# Patient Record
Sex: Female | Born: 1950 | Race: Black or African American | Hispanic: No | Smoking: Never smoker
Health system: Southern US, Community
[De-identification: ages and names within clinical notes are randomized; demographics above are authoritative.]

## PROBLEM LIST (undated history)

## (undated) DIAGNOSIS — E785 Hyperlipidemia, unspecified: Secondary | ICD-10-CM

## (undated) DIAGNOSIS — N200 Calculus of kidney: Secondary | ICD-10-CM

## (undated) DIAGNOSIS — J40 Bronchitis, not specified as acute or chronic: Secondary | ICD-10-CM

## (undated) DIAGNOSIS — E119 Type 2 diabetes mellitus without complications: Secondary | ICD-10-CM

## (undated) DIAGNOSIS — I10 Essential (primary) hypertension: Secondary | ICD-10-CM

## (undated) HISTORY — PX: TONSILLECTOMY: SUR1361

## (undated) HISTORY — DX: Type 2 diabetes mellitus without complications: E11.9

---

## 1999-04-01 ENCOUNTER — Other Ambulatory Visit: Admission: RE | Admit: 1999-04-01 | Discharge: 1999-04-01 | Payer: Self-pay | Admitting: Obstetrics and Gynecology

## 2001-11-05 ENCOUNTER — Emergency Department (HOSPITAL_COMMUNITY): Admission: EM | Admit: 2001-11-05 | Discharge: 2001-11-05 | Payer: Self-pay

## 2001-11-19 ENCOUNTER — Emergency Department (HOSPITAL_COMMUNITY): Admission: EM | Admit: 2001-11-19 | Discharge: 2001-11-19 | Payer: Self-pay | Admitting: *Deleted

## 2003-03-20 ENCOUNTER — Encounter: Payer: Self-pay | Admitting: Internal Medicine

## 2004-03-27 ENCOUNTER — Ambulatory Visit: Payer: Self-pay | Admitting: Internal Medicine

## 2004-04-01 ENCOUNTER — Ambulatory Visit: Payer: Self-pay | Admitting: Internal Medicine

## 2004-04-13 ENCOUNTER — Ambulatory Visit: Payer: Self-pay

## 2004-04-16 ENCOUNTER — Ambulatory Visit: Payer: Self-pay | Admitting: Internal Medicine

## 2004-05-18 ENCOUNTER — Encounter: Admission: RE | Admit: 2004-05-18 | Discharge: 2004-05-18 | Payer: Self-pay | Admitting: Cardiology

## 2004-10-28 ENCOUNTER — Ambulatory Visit: Payer: Self-pay | Admitting: Internal Medicine

## 2005-04-12 ENCOUNTER — Ambulatory Visit: Payer: Self-pay | Admitting: Internal Medicine

## 2005-05-24 ENCOUNTER — Ambulatory Visit: Payer: Self-pay | Admitting: Internal Medicine

## 2005-06-08 ENCOUNTER — Encounter: Admission: RE | Admit: 2005-06-08 | Discharge: 2005-09-06 | Payer: Self-pay | Admitting: Internal Medicine

## 2005-06-11 ENCOUNTER — Ambulatory Visit: Payer: Self-pay | Admitting: Internal Medicine

## 2005-11-10 ENCOUNTER — Ambulatory Visit: Payer: Self-pay | Admitting: Internal Medicine

## 2005-11-19 ENCOUNTER — Ambulatory Visit: Payer: Self-pay | Admitting: Internal Medicine

## 2006-07-04 DIAGNOSIS — I10 Essential (primary) hypertension: Secondary | ICD-10-CM | POA: Insufficient documentation

## 2006-07-04 DIAGNOSIS — Z8679 Personal history of other diseases of the circulatory system: Secondary | ICD-10-CM | POA: Insufficient documentation

## 2007-06-13 ENCOUNTER — Ambulatory Visit: Payer: Self-pay | Admitting: Internal Medicine

## 2007-06-13 DIAGNOSIS — E785 Hyperlipidemia, unspecified: Secondary | ICD-10-CM | POA: Insufficient documentation

## 2007-06-13 DIAGNOSIS — R9431 Abnormal electrocardiogram [ECG] [EKG]: Secondary | ICD-10-CM | POA: Insufficient documentation

## 2007-06-13 LAB — CONVERTED CEMR LAB
Bilirubin Urine: NEGATIVE
Blood in Urine, dipstick: NEGATIVE
Glucose, Urine, Semiquant: NEGATIVE
Ketones, urine, test strip: NEGATIVE
Nitrite: NEGATIVE
Protein, U semiquant: NEGATIVE
Specific Gravity, Urine: 1.025
Urobilinogen, UA: NEGATIVE
WBC Urine, dipstick: NEGATIVE
pH: 5

## 2007-06-15 LAB — CONVERTED CEMR LAB
ALT: 19 units/L (ref 0–35)
AST: 22 units/L (ref 0–37)
Albumin: 4 g/dL (ref 3.5–5.2)
Alkaline Phosphatase: 67 units/L (ref 39–117)
BUN: 8 mg/dL (ref 6–23)
Basophils Absolute: 0.1 10*3/uL (ref 0.0–0.1)
Basophils Relative: 1.3 % — ABNORMAL HIGH (ref 0.0–1.0)
Bilirubin, Direct: 0.1 mg/dL (ref 0.0–0.3)
CO2: 31 meq/L (ref 19–32)
Calcium: 9.3 mg/dL (ref 8.4–10.5)
Chloride: 106 meq/L (ref 96–112)
Cholesterol: 220 mg/dL (ref 0–200)
Creatinine, Ser: 1 mg/dL (ref 0.4–1.2)
Direct LDL: 171.8 mg/dL
Eosinophils Absolute: 0.1 10*3/uL (ref 0.0–0.6)
Eosinophils Relative: 1.2 % (ref 0.0–5.0)
GFR calc Af Amer: 74 mL/min
GFR calc non Af Amer: 61 mL/min
Glucose, Bld: 88 mg/dL (ref 70–99)
HCT: 43 % (ref 36.0–46.0)
HDL: 40.3 mg/dL (ref >39.0)
Hemoglobin: 13.6 g/dL (ref 12.0–15.0)
Hgb A1c MFr Bld: 6.4 % — ABNORMAL HIGH (ref 4.6–6.0)
Lymphocytes Relative: 45.8 % (ref 12.0–46.0)
MCHC: 31.5 g/dL (ref 30.0–36.0)
MCV: 83.6 fL (ref 78.0–100.0)
Monocytes Absolute: 0.3 10*3/uL (ref 0.2–0.7)
Monocytes Relative: 4.2 % (ref 3.0–11.0)
Neutro Abs: 2.9 10*3/uL (ref 1.4–7.7)
Neutrophils Relative %: 47.5 % (ref 43.0–77.0)
Platelets: 343 10*3/uL (ref 150–400)
Potassium: 3.7 meq/L (ref 3.5–5.1)
RBC: 5.14 M/uL — ABNORMAL HIGH (ref 3.87–5.11)
RDW: 13.7 % (ref 11.5–14.6)
Sodium: 142 meq/L (ref 135–145)
TSH: 0.94 microintl units/mL (ref 0.35–5.50)
Total Bilirubin: 0.6 mg/dL (ref 0.3–1.2)
Total CHOL/HDL Ratio: 5.5
Total Protein: 8.1 g/dL (ref 6.0–8.3)
Triglycerides: 84 mg/dL (ref 0–149)
VLDL: 17 mg/dL (ref 0–40)
WBC: 6.2 10*3/uL (ref 4.5–10.5)

## 2007-06-16 ENCOUNTER — Encounter (INDEPENDENT_AMBULATORY_CARE_PROVIDER_SITE_OTHER): Payer: Self-pay | Admitting: *Deleted

## 2007-06-19 ENCOUNTER — Telehealth (INDEPENDENT_AMBULATORY_CARE_PROVIDER_SITE_OTHER): Payer: Self-pay | Admitting: *Deleted

## 2007-09-05 ENCOUNTER — Ambulatory Visit: Payer: Self-pay | Admitting: Internal Medicine

## 2007-09-11 LAB — CONVERTED CEMR LAB
ALT: 17 units/L (ref 0–35)
AST: 18 units/L (ref 0–37)
Cholesterol: 172 mg/dL (ref 0–200)
HDL: 36 mg/dL — ABNORMAL LOW (ref >39.0)
Hgb A1c MFr Bld: 6.4 % — ABNORMAL HIGH (ref 4.6–6.0)
LDL Cholesterol: 124 mg/dL — ABNORMAL HIGH (ref 0–99)
Total CHOL/HDL Ratio: 4.8
Triglycerides: 58 mg/dL (ref 0–149)
VLDL: 12 mg/dL (ref 0–40)

## 2008-08-09 ENCOUNTER — Telehealth (INDEPENDENT_AMBULATORY_CARE_PROVIDER_SITE_OTHER): Payer: Self-pay | Admitting: *Deleted

## 2008-08-20 ENCOUNTER — Ambulatory Visit: Payer: Self-pay | Admitting: Internal Medicine

## 2008-08-20 DIAGNOSIS — E119 Type 2 diabetes mellitus without complications: Secondary | ICD-10-CM | POA: Insufficient documentation

## 2008-08-20 LAB — CONVERTED CEMR LAB
Cholesterol, target level: 200 mg/dL
HDL goal, serum: 40 mg/dL
LDL Goal: 130 mg/dL

## 2008-10-22 ENCOUNTER — Ambulatory Visit: Payer: Self-pay | Admitting: Internal Medicine

## 2008-10-29 ENCOUNTER — Encounter (INDEPENDENT_AMBULATORY_CARE_PROVIDER_SITE_OTHER): Payer: Self-pay | Admitting: *Deleted

## 2008-10-29 LAB — CONVERTED CEMR LAB
ALT: 22 units/L (ref 0–35)
AST: 21 units/L (ref 0–37)
Albumin: 4.2 g/dL (ref 3.5–5.2)
Alkaline Phosphatase: 74 units/L (ref 39–117)
BUN: 12 mg/dL (ref 6–23)
Bilirubin, Direct: 0 mg/dL (ref 0.0–0.3)
Cholesterol: 198 mg/dL (ref 0–200)
Creatinine, Ser: 0.9 mg/dL (ref 0.4–1.2)
HDL: 33.8 mg/dL — ABNORMAL LOW (ref >39.00)
Hgb A1c MFr Bld: 6.4 % (ref 4.6–6.5)
LDL Cholesterol: 146 mg/dL — ABNORMAL HIGH (ref 0–99)
Potassium: 4.2 meq/L (ref 3.5–5.1)
TSH: 1.43 microintl units/mL (ref 0.35–5.50)
Total Bilirubin: 0.8 mg/dL (ref 0.3–1.2)
Total CHOL/HDL Ratio: 6
Total Protein: 8.1 g/dL (ref 6.0–8.3)
Triglycerides: 89 mg/dL (ref 0.0–149.0)
VLDL: 17.8 mg/dL (ref 0.0–40.0)

## 2010-01-05 ENCOUNTER — Telehealth: Payer: Self-pay | Admitting: Internal Medicine

## 2010-01-07 ENCOUNTER — Ambulatory Visit: Payer: Self-pay | Admitting: Internal Medicine

## 2010-01-07 ENCOUNTER — Encounter: Payer: Self-pay | Admitting: Internal Medicine

## 2010-01-15 ENCOUNTER — Ambulatory Visit: Payer: Self-pay | Admitting: Internal Medicine

## 2010-02-06 ENCOUNTER — Ambulatory Visit: Payer: Self-pay | Admitting: Internal Medicine

## 2010-04-28 NOTE — Assessment & Plan Note (Signed)
Summary: MEDS REFILL////SPH--HASNT BEEN SEEN SINCE MAY 2010///SPH   Vital Signs:  Patient profile:   60 year old female Height:      68.25 inches Weight:      216.4 pounds BMI:     32.78 Temp:     98.4 degrees F oral Pulse rate:   76 / minute Resp:     14 per minute BP sitting:   128 / 86  (left arm) Cuff size:   large  Vitals Entered By: Shonna Chock CMA (January 07, 2010 2:12 PM)    History of Present Illness:   Cynthia Forbes is here for a physical; she is asymptomatic.    HTN followup:The patient reports  some urinary frequency, but denies lightheadedness, headaches, and fatigue.  The patient denies the following associated symptoms: chest pain, chest pressure, exercise intolerance, dyspnea, palpitations, syncope, leg edema, and pedal edema.  Compliance with medications (by patient report) has been sporadic.Her insurance had lapsed & she was taking meds daily vs twice a day.  The patient reports that dietary compliance has been good.  The patient reports exercising occasionally.  Adjunctive measures currently used by the patient include salt restriction.  BP @ home 117-121/ 74-84.  Current Medications (verified): 1)  Metoprolol Tartrate 100 Mg Tabs (Metoprolol Tartrate) .Marland Kitchen.. 1 By Mouth Bid 2)  Spironolactone 25 Mg Tabs (Spironolactone) .Marland Kitchen.. 1 By Mouth Two Times A Day 3)  Albuterol 90 Mcg/act Aers (Albuterol) .Marland Kitchen.. 1-2 Puffs Q 4 Hours As Needed 4)  Asa 81mg  .... Once Daily 5)  Fish Oil 1000 Mg Caps (Omega-3 Fatty Acids) .Marland Kitchen.. 1 By Mouth Once Daily As Needed  Allergies: 1)  ! Codeine 2)  ! * Ivp Dye  Past History:  Past Medical History: MITRAL VALVE PROLAPSE, PMH  OF (ICD-V12.50) HYPERTENSION (ICD-401.9) Diabetes mellitus, type II Hyperlipidemia: NMR 2005: LDL 811(9147/829), HDL 29, TG 94.LDL goal = < 130.  Past Surgical History: gravida 1, para 1, adopted 2 Tonsillectomy renal calculi X3, Dr Marcello Fennel chest pain attributed to  mitral valve prolapse heart  cath 2006  negative,  Dr Elsie Lincoln Colonoscopy : NONE ( "no insurance")SOC reviewed   Family History: mother: hypertension,cva, dementia, disseminated  cancer, ? colon cancer with mets maternal aunts, uncles,cousin : metastatic cancer father :pancreatic cancer , hypertension, asthma,renal failure maternal grandfather: liver cancer  Social History: Never Smoked Alcohol use-no Occupation:Administrative Forensic psychologist  Review of Systems General:  Denies fatigue, fever, and weight loss; Hot flashes. Eyes:  Denies blurring, double vision, and vision loss-both eyes. ENT:  Denies difficulty swallowing and hoarseness. Resp:  Denies cough, shortness of breath, sputum productive, and wheezing; Seasonal RAD & post exercise. GI:  Denies abdominal pain, bloody stools, dark tarry stools, and indigestion. GU:  Denies discharge, dysuria, and hematuria. MS:  Denies joint pain, low back pain, mid back pain, and thoracic pain. Derm:  Denies changes in nail beds, dryness, hair loss, lesion(s), and poor wound healing. Neuro:  Denies numbness and tingling; No extremity burning. Psych:  Denies anxiety and depression. Endo:  Denies cold intolerance, excessive hunger, excessive thirst, excessive urination, and heat intolerance. Heme:  Denies abnormal bruising and bleeding. Allergy:  Complains of itching eyes, seasonal allergies, and sneezing.  Physical Exam  General:  well-nourished;alert,appropriate and cooperative throughout examination Head:  Normocephalic and atraumatic without obvious abnormalities.  Eyes:  No corneal or conjunctival inflammation noted.Perrla. Funduscopic exam benign, without hemorrhages, exudates or papilledema. Ears:  External ear exam shows no significant lesions or deformities.  Otoscopic examination reveals  clear canals, tympanic membranes are intact bilaterally without bulging, retraction, inflammation or discharge. Hearing is grossly normal bilaterally. Nose:  External nasal examination  shows no deformity or inflammation. Nasal mucosa are pink and moist without lesions or exudates. Mouth:  Oral mucosa and oropharynx without lesions or exudates.  Teeth in good repair. Neck:  No deformities, masses, or tenderness noted. Lungs:  Normal respiratory effort, chest expands symmetrically. Lungs are clear to auscultation, no crackles or wheezes. Heart:  Normal rate and regular rhythm. S1 and S2 normal without gallop, murmur, click, rub .S4 Abdomen:  Bowel sounds positive,abdomen soft and non-tender without masses, organomegaly or hernias noted. Genitalia:  Dr Arelia Sneddon Msk:  No deformity or scoliosis noted of thoracic or lumbar spine.   Pulses:  R and L carotid,radial,dorsalis pedis and posterior tibial pulses are full and equal bilaterally Extremities:  No clubbing, cyanosis, edema, or deformity noted with normal full range of motion of all joints.   Neurologic:  alert & oriented X3 and DTRs symmetrical and 1/2 +  Skin:  Intact without suspicious lesions or rashes Cervical Nodes:  No lymphadenopathy noted Axillary Nodes:  No palpable lymphadenopathy Psych:  memory intact for recent and remote, normally interactive, and good eye contact.     Impression & Recommendations:  Problem # 1:  ROUTINE GENERAL MEDICAL EXAM@HEALTH  CARE FACL (ICD-V70.0)  Orders: EKG w/ Interpretation (93000)  Problem # 2:  HYPERTENSION (ICD-401.9)  Her updated medication list for this problem includes:    Metoprolol Tartrate 100 Mg Tabs (Metoprolol tartrate) .Marland Kitchen... 1 by mouth bid    Spironolactone 25 Mg Tabs (Spironolactone) .Marland Kitchen... 1 by mouth two times a day  Problem # 3:  HYPERLIPIDEMIA (ICD-272.4)  Problem # 4:  NONSPECIFIC ABNORMAL ELECTROCARDIOGRAM (ICD-794.31) improved serially  Complete Medication List: 1)  Metoprolol Tartrate 100 Mg Tabs (Metoprolol tartrate) .Marland Kitchen.. 1 by mouth bid 2)  Spironolactone 25 Mg Tabs (Spironolactone) .Marland Kitchen.. 1 by mouth two times a day 3)  Albuterol 90 Mcg/act Aers  (Albuterol) .Marland Kitchen.. 1-2 puffs q 4 hours as needed 4)  Asa 81mg   .... Once daily 5)  Fish Oil 1000 Mg Caps (Omega-3 fatty acids) .Marland Kitchen.. 1 by mouth once daily as needed  Patient Instructions: 1)  Schedule fasting labs ; see Diagnoses for Codes: 2)  BMP prior to visit, ICD-9: 3)  Hepatic Panel prior to visit, ICD-9: 4)  Lipid Panel prior to visit, ICD-9: 5)  TSH prior to visit, ICD-9: 6)  CBC w/ Diff prior to visit, ICD-9: 7)  HbgA1C prior to visit, ICD-9: 8)  Urine Microalbumin prior to visit, ICD-9: 9)  Schedule a colonoscopy as discussed  to help detect colon cancer. 10)  Check your Blood Pressure regularly. If it is above: 135/85 ON AVERAGE  you should make an appointment. Prescriptions: ALBUTEROL 90 MCG/ACT AERS (ALBUTEROL) 1-2 puffs q 4 hours as needed  #1 x 2   Entered and Authorized by:   Marga Melnick MD   Signed by:   Marga Melnick MD on 01/07/2010   Method used:   Print then Give to Patient   RxID:   [id number] SPIRONOLACTONE 25 MG TABS (SPIRONOLACTONE) 1 by mouth two times a day  #180 x 1   Entered and Authorized by:   Marga Melnick MD   Signed by:   Marga Melnick MD on 01/07/2010   Method used:   Print then Give to Patient   RxID:   [id number] METOPROLOL TARTRATE 100 MG TABS (METOPROLOL TARTRATE) 1 by mouth bid  #180  x 1   Entered and Authorized by:   Marga Melnick MD   Signed by:   Marga Melnick MD on 01/07/2010   Method used:   Print then Give to Patient   RxID:   [id number]     Appended Document: MEDS REFILL////SPH--HASNT BEEN SEEN SINCE MAY 2010///SPH A1c 6.5%; urine microalbumin 63.3,LDL 133

## 2010-04-28 NOTE — Progress Notes (Signed)
Summary: METOPROLOL REFILL QUESTION  Phone Note Call from Patient Call back at [phone]   Caller: Patient Summary of Call: PATIENT HAS APPT TO SEE DR HOPPER ON WED 10/12 IN AFTERNOON---NEEDS TO REFILL METOPROLOL--SHOULD HAVE BEEN TAKING IT TWICE A DAY---HAS BEEN STRETCHING IT TO ONCE A DAY TO MAKE IT LAST---DOESNT WANT TO ASK FOR NEW PRESCRIPTION SINCE DR HOPPER MAY CHANGE IT---DO WE HAVE SAMPLES??  ONLY HAS THREE PILLS LEFT--ONE FOR EACH DAY UNTIL WED  OK TO LEAVE MESSAGE ON PHONE Initial call taken by: Jerolyn Shin,  January 05, 2010 10:03 AM  Follow-up for Phone Call        We do not have samples of this med, would you like for to offer to call in temporary prescription or offer something else. Please advise. Lucious Groves CMA  January 05, 2010 3:43 PM   Additional Follow-up for Phone Call Additional follow up Details #1::        Per MD patient needs to go to [facility] or other pharmacy that she does not normally use and pay for the rx out of pocket (no insurance). The prescription will be $4 for #60.   Patient notified and will go to [facility] [address] Additional Follow-up by: Lucious Groves CMA,  January 05, 2010 4:18 PM    Prescriptions: METOPROLOL TARTRATE 100 MG TABS (METOPROLOL TARTRATE) 1 by mouth bid  #60 x 0   Entered by:   Lucious Groves CMA   Authorized by:   Marga Melnick MD   Signed by:   Lucious Groves CMA on 01/05/2010   Method used:   Electronically to        [id number] (retail)       [address]       [place], [place]  [address]       Ph: [phone] or [phone]       Fax: [phone]   RxID:   [id number]

## 2010-04-28 NOTE — Assessment & Plan Note (Signed)
Summary: to discuss labs///sph   Vital Signs:  Patient profile:   60 year old female Weight:      217.2 pounds BMI:     32.90 Pulse rate:   76 / minute Resp:     17 per minute BP sitting:   134 / 90  (left arm) Cuff size:   large  Vitals Entered By: Shonna Chock CMA (February 06, 2010 3:10 PM) CC: Follow-up visit: discuss labs (patient with mailed copy), Type 2 diabetes mellitus follow-up   CC:  Follow-up visit: discuss labs (patient with mailed copy) and Type 2 diabetes mellitus follow-up.  History of Present Illness: Type 2 Diabetes Mellitus Follow-Up      This is a 60 year old woman who presents for Type 2 diabetes mellitus follow-up.  The patient denies polyuria, polydipsia, blurred vision, and numbness of extremities.  The patient denies the following symptoms: neuropathic pain, chest pain, vomiting, orthostatic symptoms, poor wound healing, intermittent claudication, vision loss, and foot ulcer.  Since the last visit the patient reports good dietary compliance and not monitoring blood glucose.  Since the last visit, the patient reports having had eye care by an  eye doctor. Vision actually improved. A1c 6.5% ( average 140 , risk 30%).    Current Medications (verified): 1)  Metoprolol Tartrate 100 Mg Tabs (Metoprolol Tartrate) .Marland Kitchen.. 1 By Mouth Bid 2)  Spironolactone 25 Mg Tabs (Spironolactone) .Marland Kitchen.. 1 By Mouth Two Times A Day 3)  Albuterol 90 Mcg/act Aers (Albuterol) .Marland Kitchen.. 1-2 Puffs Q 4 Hours As Needed 4)  Asa 81mg  .... Once Daily 5)  Fish Oil 1000 Mg Caps (Omega-3 Fatty Acids) .Marland Kitchen.. 1 By Mouth Once Daily As Needed  Allergies: 1)  ! Codeine 2)  ! * Ivp Dye  Physical Exam  General:  well-nourished;alert,appropriate and cooperative throughout examination Heart:  normal rate, regular rhythm, no gallop, no rub, no JVD, and grade 1 /6 systolic murmur.   Pulses:  R and L carotid,radial,dorsalis pedis and posterior tibial pulses are full and equal bilaterally Psych:  Oriented X3.    Intelligent & motivated   Impression & Recommendations:  Problem # 1:  DIABETES MELLITUS, TYPE II (ICD-250.00)  Her updated medication list for this problem includes:    Benazepril Hcl 10 Mg Tabs (Benazepril hcl) .Marland Kitchen... 1 once daily    Metformin Hcl 500 Mg Tabs (Metformin hcl) .Marland Kitchen... 1 once daily with largest meal  Problem # 2:  HYPERLIPIDEMIA (ICD-272.4)  Her updated medication list for this problem includes:    Pravastatin Sodium 20 Mg Tabs (Pravastatin sodium) .Marland Kitchen... 1 at bedtime  Problem # 3:  HYPERTENSION (ICD-401.9)  The following medications were removed from the medication list:    Spironolactone 25 Mg Tabs (Spironolactone) .Marland Kitchen... 1 by mouth two times a day Her updated medication list for this problem includes:    Metoprolol Tartrate 100 Mg Tabs (Metoprolol tartrate) .Marland Kitchen... 1 by mouth bid    Benazepril Hcl 10 Mg Tabs (Benazepril hcl) .Marland Kitchen... 1 once daily  Complete Medication List: 1)  Metoprolol Tartrate 100 Mg Tabs (Metoprolol tartrate) .Marland Kitchen.. 1 by mouth bid 2)  Albuterol 90 Mcg/act Aers (Albuterol) .Marland Kitchen.. 1-2 puffs q 4 hours as needed 3)  Asa 81mg   .... Once daily 4)  Fish Oil 1000 Mg Caps (Omega-3 fatty acids) .Marland Kitchen.. 1 by mouth once daily as needed 5)  Benazepril Hcl 10 Mg Tabs (Benazepril hcl) .Marland Kitchen.. 1 once daily 6)  Metformin Hcl 500 Mg Tabs (Metformin hcl) .Marland Kitchen.. 1 once daily with  largest meal 7)  Pravastatin Sodium 20 Mg Tabs (Pravastatin sodium) .Marland Kitchen.. 1 at bedtime  Patient Instructions: 1)  Please schedule a follow-up appointment in 3 months. 2)  Hepatic Panel prior to visit, ICD-9:995.20 3)  Lipid Panel prior to visit, ICD-9:272.4 4)  HbgA1C prior to visit, ICD-9:250.00 Prescriptions: PRAVASTATIN SODIUM 20 MG TABS (PRAVASTATIN SODIUM) 1 at bedtime  #90 x 1   Entered and Authorized by:   Marga Melnick MD   Signed by:   Marga Melnick MD on 02/06/2010   Method used:   Print then Give to Patient   RxID:   [id number] METFORMIN HCL 500 MG TABS (METFORMIN HCL) 1 once daily  with largest meal  #90 x 1   Entered and Authorized by:   Marga Melnick MD   Signed by:   Marga Melnick MD on 02/06/2010   Method used:   Print then Give to Patient   RxID:   [id number] BENAZEPRIL HCL 10 MG TABS (BENAZEPRIL HCL) 1 once daily  #90 x 1   Entered and Authorized by:   Marga Melnick MD   Signed by:   Marga Melnick MD on 02/06/2010   Method used:   Print then Give to Patient   RxID:   [id number]    Orders Added: 1)  Est. Patient Level III [01027]

## 2010-05-06 ENCOUNTER — Other Ambulatory Visit (INDEPENDENT_AMBULATORY_CARE_PROVIDER_SITE_OTHER): Payer: BC Managed Care – PPO

## 2010-05-06 ENCOUNTER — Other Ambulatory Visit: Payer: Self-pay | Admitting: Internal Medicine

## 2010-05-06 ENCOUNTER — Encounter (INDEPENDENT_AMBULATORY_CARE_PROVIDER_SITE_OTHER): Payer: Self-pay | Admitting: *Deleted

## 2010-05-06 DIAGNOSIS — E785 Hyperlipidemia, unspecified: Secondary | ICD-10-CM

## 2010-05-06 DIAGNOSIS — T887XXA Unspecified adverse effect of drug or medicament, initial encounter: Secondary | ICD-10-CM

## 2010-05-06 DIAGNOSIS — E119 Type 2 diabetes mellitus without complications: Secondary | ICD-10-CM

## 2010-05-06 LAB — LIPID PANEL
Cholesterol: 179 mg/dL (ref 0–200)
HDL: 43 mg/dL (ref >39.00)

## 2010-05-06 LAB — HEPATIC FUNCTION PANEL
ALT: 22 U/L (ref 0–35)
AST: 21 U/L (ref 0–37)
Bilirubin, Direct: 0.1 mg/dL (ref 0.0–0.3)
Total Bilirubin: 0.6 mg/dL (ref 0.3–1.2)

## 2010-05-06 LAB — HEMOGLOBIN A1C: Hgb A1c MFr Bld: 6.4 % (ref 4.6–6.5)

## 2010-05-13 ENCOUNTER — Encounter: Payer: Self-pay | Admitting: Internal Medicine

## 2010-05-13 ENCOUNTER — Ambulatory Visit (INDEPENDENT_AMBULATORY_CARE_PROVIDER_SITE_OTHER): Payer: BC Managed Care – PPO | Admitting: Internal Medicine

## 2010-05-13 DIAGNOSIS — E785 Hyperlipidemia, unspecified: Secondary | ICD-10-CM

## 2010-05-13 DIAGNOSIS — I10 Essential (primary) hypertension: Secondary | ICD-10-CM

## 2010-05-13 DIAGNOSIS — E119 Type 2 diabetes mellitus without complications: Secondary | ICD-10-CM

## 2010-05-20 NOTE — Assessment & Plan Note (Signed)
Summary: FOLLOW UP///SPH   Vital Signs:  Patient profile:   60 year old female Weight:      208.4 pounds BMI:     31.57 Pulse rate:   72 / minute Resp:     14 per minute BP sitting:   160 / 100  (left arm) Cuff size:   large  Vitals Entered By: Shonna Chock CMA (May 13, 2010 9:14 AM) CC: Follow-up visit: discuss labs (patient with mailed copy), Type 2 diabetes mellitus follow-up   CC:  Follow-up visit: discuss labs (patient with mailed copy) and Type 2 diabetes mellitus follow-up.  History of Present Illness:    Labs reviewed & risks discussed.     She  denies muscle aches, GI upset, abdominal pain, flushing, itching, constipation, diarrhea, and fatigue.  The patient denies the following symptoms: chest pain/pressure, exercise intolerance, dypsnea, palpitations, syncope, and pedal edema.  Compliance with medications (by patient report) has been near 100%.  Dietary compliance has been excellent.  The patient reports exercising occasionally.  Adjunctive measures currently used by the patient include fiber and ASA.  LDL 120 on Pravastatin 20 mg at bedtime.     See BP; she has not taken BP pills this. She  denies lightheadedness, urinary frequency, and headaches.  Compliance with medications (by patient report) has been near 100%.  Adjunctive measures currently used by the patient include salt restriction.  BP @ home 117-146/74-90.     She  reports weight loss, but denies polyuria, polydipsia, blurred vision, self managed hypoglycemia, and numbness of extremities.  The patient denies the following symptoms: neuropathic pain, vomiting, poor wound healing, intermittent claudication, vision loss, and foot ulcer.  Since the last visit the patient reports not monitoring blood glucose.  Since the last visit, the patient reports having had no foot care. A1c 6.4%    Current Medications (verified): 1)  Metoprolol Tartrate 100 Mg Tabs (Metoprolol Tartrate) .Marland Kitchen.. 1 By Mouth Bid 2)  Albuterol 90  Mcg/act Aers (Albuterol) .Marland Kitchen.. 1-2 Puffs Q 4 Hours As Needed 3)  Asa 81mg  .... Once Daily 4)  Fish Oil 1000 Mg Caps (Omega-3 Fatty Acids) .Marland Kitchen.. 1 By Mouth Once Daily As Needed 5)  Benazepril Hcl 10 Mg Tabs (Benazepril Hcl) .Marland Kitchen.. 1 Once Daily 6)  Metformin Hcl 500 Mg Tabs (Metformin Hcl) .Marland Kitchen.. 1 Once Daily With Largest Meal 7)  Pravastatin Sodium 20 Mg Tabs (Pravastatin Sodium) .Marland Kitchen.. 1 At Bedtime  Allergies: 1)  ! Codeine 2)  ! * Ivp Dye  Physical Exam  General:  well-nourished;alert,appropriate and cooperative throughout examination Neck:  No deformities, masses, or tenderness noted. Lungs:  Normal respiratory effort, chest expands symmetrically. Lungs are clear to auscultation, no crackles or wheezes. Heart:  Normal rate and regular rhythm. S1 and S2 normal without gallop, murmur, click, rub . S4 Pulses:  R and L carotid,radial,dorsalis pedis and posterior tibial pulses are full and equal bilaterally Extremities:  No clubbing, cyanosis, edema.  Neurologic:  alert & oriented X3 and sensation intact to light touch over feet.   Skin:  Intact without suspicious lesions or rashes Psych:  Focused & extremely motivated   Impression & Recommendations:  Problem # 1:  DIABETES MELLITUS, TYPE II (ICD-250.00) Good control Her updated medication list for this problem includes:    Metformin Hcl 500 Mg Tabs (Metformin hcl) .Marland Kitchen... 1 once daily with largest meal  Problem # 2:  HYPERTENSION (ICD-401.9) Controlled by history; goals discussed Her updated medication list for this problem includes:  Metoprolol Tartrate 100 Mg Tabs (Metoprolol tartrate) .Marland Kitchen... 1 by mouth bid  Problem # 3:  HYPERLIPIDEMIA (ICD-272.4) LDL not @ goal  Complete Medication List: 1)  Metoprolol Tartrate 100 Mg Tabs (Metoprolol tartrate) .Marland Kitchen.. 1 by mouth bid 2)  Albuterol 90 Mcg/act Aers (Albuterol) .Marland Kitchen.. 1-2 puffs q 4 hours as needed 3)  Asa 81mg   .... Once daily 4)  Fish Oil 1000 Mg Caps (Omega-3 fatty acids) .Marland Kitchen.. 1 by  mouth once daily as needed 5)  Benazepril Hcl 20 Mg Tabs (benazepril Hcl)  .Marland Kitchen.. 1 once daily 6)  Metformin Hcl 500 Mg Tabs (Metformin hcl) .Marland Kitchen.. 1 once daily with largest meal 7)  Pravastatin Sodium 40 Mg Tabs (pravastatin Sodium)  .Marland Kitchen.. 1 at bedtime  Patient Instructions: 1)  Please schedule a follow-up appointment in 6 months. 2)  BUN, creat, K+ prior to visit, ICD-9:401.9 3)  Hepatic Panel prior to visit, ICD-9:995.20 4)  Lipid Panel prior to visit, ICD-9:272.4 5)  HbgA1C prior to visit, ICD-9:250.00 6)  Urine Microalbumin prior to visit, ICD-9:250.00 7)  See your eye doctor yearly to check for diabetic eye damage. 8)  Check your feet each night for sore areas, calluses or signs of infection. 9)  Check your Blood Pressure regularly. If it is above:135/85 ON AVERAGE you should make an appointment. Prescriptions: PRAVASTATIN SODIUM 40 MG TABS (PRAVASTATIN SODIUM) 1 at bedtime  #90 x 1   Entered and Authorized by:   Marga Melnick MD   Signed by:   Marga Melnick MD on 05/13/2010   Method used:   Print then Give to Patient   RxID:   [id number] BENAZEPRIL HCL 20  MG TABS (BENAZEPRIL HCL) 1 once daily  #90 x 1   Entered and Authorized by:   Marga Melnick MD   Signed by:   Marga Melnick MD on 05/13/2010   Method used:   Print then Give to Patient   RxID:   [id number]    Orders Added: 1)  Est. Patient Level III [84696]

## 2011-02-12 ENCOUNTER — Other Ambulatory Visit: Payer: Self-pay | Admitting: Internal Medicine

## 2011-07-07 ENCOUNTER — Encounter: Payer: Self-pay | Admitting: Internal Medicine

## 2011-07-07 ENCOUNTER — Ambulatory Visit (INDEPENDENT_AMBULATORY_CARE_PROVIDER_SITE_OTHER): Payer: BC Managed Care – PPO | Admitting: Internal Medicine

## 2011-07-07 VITALS — BP 142/84 | HR 72 | Temp 98.5°F | Wt 218.0 lb

## 2011-07-07 DIAGNOSIS — M546 Pain in thoracic spine: Secondary | ICD-10-CM

## 2011-07-07 MED ORDER — CYCLOBENZAPRINE HCL 5 MG PO TABS
ORAL_TABLET | ORAL | Status: DC
Start: 2011-07-07 — End: 2011-08-22

## 2011-07-07 MED ORDER — CELECOXIB 200 MG PO CAPS: 200.0000 mg | ORAL_CAPSULE | Freq: Two times a day (BID) | ORAL | Status: AC

## 2011-07-07 NOTE — Progress Notes (Signed)
  Subjective:    Patient ID: Cynthia Forbes, female    DOB: 10/12/1950, 61 y.o.   MRN: [id number]  HPI She has had almost constant left upper posterior thorax "gnawing" pain since 07/02/11. She attributes this to lifting her grandson repeatedly. He weighs approximately 55 pounds. She also questions whether this is related to moving tables at work.  If she places her hand on this area & presses down  she gets relief as she does with rotating the thorax laterally. Low-dose nonsteroidals & heat were of  minimal benefit.  She has a past history of mitral valve prolapse with palpitations and pain. She states the discomfort was somewhat similar to that    Review of Systems  She denies any significant associated anterior chest pain, palpitations, dyspnea, cough, sputum production, or hemoptysis. She's had no swelling or pain in her calves.  There's been no rash in the area of the discomfort. She's noted some associated numbness possibly in the left upper extremity but no tingling or weakness.     Objective:   Physical Exam  She is healthy and well-nourished.  There is no lymphadenopathy about the neck or axilla.  No rash is noted over the left upper posterior thorax.  There is some localized tenderness to deep palpation in this area  Deep tendon reflexes, strength and tone are all normal.  With neck flexion she has some discomfort in the left trapezius area. No neuromuscular deficit can be documented in the upper extremities        Assessment & Plan:  #1 possible C5 radicular pain. No neuromuscular deficit present  Plan: Physical therapy if no better with massage , cervical pillow,Celebrex   and muscle relaxants .

## 2011-07-07 NOTE — Patient Instructions (Addendum)
Use a cervical memory foam pillow to prevent hyperextension or hyperflexion of the cervical spine. Massage therapy is medically indicated

## 2011-07-08 ENCOUNTER — Other Ambulatory Visit: Payer: Self-pay | Admitting: Internal Medicine

## 2011-07-12 ENCOUNTER — Other Ambulatory Visit: Payer: Self-pay | Admitting: Internal Medicine

## 2011-07-13 NOTE — Telephone Encounter (Signed)
Patient needs to schedule a CPX with fasting labs  

## 2011-08-22 ENCOUNTER — Other Ambulatory Visit: Payer: Self-pay

## 2011-08-22 ENCOUNTER — Emergency Department (HOSPITAL_COMMUNITY): Payer: BC Managed Care – PPO

## 2011-08-22 ENCOUNTER — Emergency Department (HOSPITAL_COMMUNITY)
Admission: EM | Admit: 2011-08-22 | Discharge: 2011-08-22 | Disposition: A | Discharge status: Home / Self Care | Payer: BC Managed Care – PPO | Attending: Emergency Medicine | Admitting: Emergency Medicine

## 2011-08-22 ENCOUNTER — Encounter (HOSPITAL_COMMUNITY): Payer: Self-pay | Admitting: Emergency Medicine

## 2011-08-22 DIAGNOSIS — N132 Hydronephrosis with renal and ureteral calculous obstruction: Secondary | ICD-10-CM

## 2011-08-22 DIAGNOSIS — R112 Nausea with vomiting, unspecified: Secondary | ICD-10-CM | POA: Insufficient documentation

## 2011-08-22 DIAGNOSIS — R1031 Right lower quadrant pain: Secondary | ICD-10-CM | POA: Insufficient documentation

## 2011-08-22 DIAGNOSIS — I10 Essential (primary) hypertension: Secondary | ICD-10-CM | POA: Insufficient documentation

## 2011-08-22 DIAGNOSIS — N201 Calculus of ureter: Secondary | ICD-10-CM | POA: Insufficient documentation

## 2011-08-22 DIAGNOSIS — R197 Diarrhea, unspecified: Secondary | ICD-10-CM | POA: Insufficient documentation

## 2011-08-22 HISTORY — DX: Bronchitis, not specified as acute or chronic: J40

## 2011-08-22 HISTORY — DX: Essential (primary) hypertension: I10

## 2011-08-22 LAB — COMPREHENSIVE METABOLIC PANEL
ALT: 27 U/L (ref 0–35)
AST: 23 U/L (ref 0–37)
Albumin: 4.2 g/dL (ref 3.5–5.2)
Alkaline Phosphatase: 84 U/L (ref 39–117)
BUN: 11 mg/dL (ref 6–23)
CO2: 28 mEq/L (ref 19–32)
Calcium: 9.2 mg/dL (ref 8.4–10.5)
Chloride: 104 mEq/L (ref 96–112)
Creatinine, Ser: 1.1 mg/dL (ref 0.50–1.10)
GFR calc Af Amer: 63 mL/min — ABNORMAL LOW (ref >90)
GFR calc non Af Amer: 54 mL/min — ABNORMAL LOW (ref >90)
Glucose, Bld: 149 mg/dL — ABNORMAL HIGH (ref 70–99)
Potassium: 3.5 mEq/L (ref 3.5–5.1)
Sodium: 139 mEq/L (ref 135–145)
Total Bilirubin: 0.2 mg/dL — ABNORMAL LOW (ref 0.3–1.2)
Total Protein: 8.3 g/dL (ref 6.0–8.3)

## 2011-08-22 LAB — URINE MICROSCOPIC-ADD ON

## 2011-08-22 LAB — URINALYSIS, ROUTINE W REFLEX MICROSCOPIC
Glucose, UA: 100 mg/dL — AB
Leukocytes, UA: NEGATIVE
Specific Gravity, Urine: 1.023 (ref 1.005–1.030)
pH: 6.5 (ref 5.0–8.0)

## 2011-08-22 LAB — CBC
Hemoglobin: 12.9 g/dL (ref 12.0–15.0)
MCH: 26 pg (ref 26.0–34.0)
Platelets: 333 10*3/uL (ref 150–400)
RBC: 4.96 MIL/uL (ref 3.87–5.11)
WBC: 9 10*3/uL (ref 4.0–10.5)

## 2011-08-22 LAB — DIFFERENTIAL
Eosinophils Absolute: 0 10*3/uL (ref 0.0–0.7)
Lymphocytes Relative: 16 % (ref 12–46)
Lymphs Abs: 1.5 10*3/uL (ref 0.7–4.0)
Monocytes Relative: 2 % — ABNORMAL LOW (ref 3–12)
Neutrophils Relative %: 81 % — ABNORMAL HIGH (ref 43–77)

## 2011-08-22 LAB — LIPASE, BLOOD: Lipase: 35 U/L (ref 11–59)

## 2011-08-22 MED ORDER — OXYCODONE-ACETAMINOPHEN 5-325 MG PO TABS: 1.0000 | ORAL_TABLET | Freq: Four times a day (QID) | ORAL | Status: AC | PRN

## 2011-08-22 MED ORDER — MORPHINE SULFATE 4 MG/ML IJ SOLN
6.0000 mg | Freq: Once | INTRAMUSCULAR | Status: AC
  Administered 2011-08-22: 6 mg via INTRAVENOUS
  Filled 2011-08-22: qty 2

## 2011-08-22 MED ORDER — IOHEXOL 300 MG/ML  SOLN
100.0000 mL | Freq: Once | INTRAMUSCULAR | Status: AC | PRN
  Administered 2011-08-22: 100 mL via INTRAVENOUS

## 2011-08-22 MED ORDER — TAMSULOSIN HCL 0.4 MG PO CAPS: 0.4000 mg | ORAL_CAPSULE | Freq: Every day | ORAL | Status: DC

## 2011-08-22 MED ORDER — PROMETHAZINE HCL 25 MG PO TABS: 25.0000 mg | ORAL_TABLET | Freq: Four times a day (QID) | ORAL | Status: DC | PRN

## 2011-08-22 MED ORDER — HYDROMORPHONE HCL PF 1 MG/ML IJ SOLN
1.0000 mg | Freq: Once | INTRAMUSCULAR | Status: AC
  Administered 2011-08-22: 1 mg via INTRAVENOUS
  Filled 2011-08-22: qty 1

## 2011-08-22 MED ORDER — ONDANSETRON HCL 4 MG/2ML IJ SOLN
4.0000 mg | Freq: Once | INTRAMUSCULAR | Status: AC
  Administered 2011-08-22: 4 mg via INTRAVENOUS
  Filled 2011-08-22: qty 2

## 2011-08-22 MED ORDER — KETOROLAC TROMETHAMINE 30 MG/ML IJ SOLN
30.0000 mg | Freq: Once | INTRAMUSCULAR | Status: AC
  Administered 2011-08-22: 30 mg via INTRAVENOUS
  Filled 2011-08-22: qty 1

## 2011-08-22 MED ORDER — SODIUM CHLORIDE 0.9 % IV BOLUS (SEPSIS)
1000.0000 mL | Freq: Once | INTRAVENOUS | Status: AC
  Administered 2011-08-22: 1000 mL via INTRAVENOUS

## 2011-08-22 NOTE — ED Notes (Signed)
Pt given diet ginger ale. Appears to tolerate it.

## 2011-08-22 NOTE — ED Notes (Signed)
Patient transported to CT 

## 2011-08-22 NOTE — ED Notes (Signed)
Patient assisted to restroom. Minimal assist needed.

## 2011-08-22 NOTE — ED Notes (Signed)
Pt c/o right sided abdominal pain that radiates to back and down side. Pt also c/o NVD that began Saturday night and continued into this morning. Pt states now she is dry-heaving. Pt is anxious and states she has "the shakes".

## 2011-08-22 NOTE — ED Notes (Addendum)
Chris PA notified regarding BP of 200/98, also informed that pt is feeling dizzy. NO BP med instructed, sts although BP is high but pt is asymptomatic.

## 2011-08-22 NOTE — ED Provider Notes (Signed)
History     CSN: [id number]  Arrival date & time 08/22/11  0554   First MD Initiated Contact with Patient 08/22/11 [phone]      Chief Complaint  Patient presents with  . Abdominal Pain    (Consider location/radiation/quality/duration/timing/severity/associated sxs/prior treatment) HPI The patient presents to the emergency department with right lower quadrant abdominal pain that began last night.  Patient, states she has also had some nausea, vomiting, and diarrhea, associated with this.  Patient states that she had some sweating last night and through the early morning today.  She denies chest pain, or shortness of breath, fever, back pain, or weakness.  Patient states she did try some ibuprofen, which gave her minimal relief.  Patient states that nothing really seemed to make the pain worse.       Past Medical History  Diagnosis Date  . Hypertension   . Bronchitis     Past Surgical History  Procedure Date  . Tonsillectomy     History reviewed. No pertinent family history.  History  Substance Use Topics  . Smoking status: Never Smoker   . Smokeless tobacco: Not on file  . Alcohol Use: No    OB History    Grav Para Term Preterm Abortions TAB SAB Ect Mult Living                  Review of Systems All other systems negative except as documented in the HPI. All pertinent positives and negatives as reviewed in the HPI.  Allergies  Codeine  Home Medications   Current Outpatient Rx  Name Route Sig Dispense Refill  . ASPIRIN 81 MG PO TABS Oral Take 81 mg by mouth daily.    Marland Kitchen BENAZEPRIL HCL 20 MG PO TABS  TAKE ONE TABLET BY MOUTH EVERY DAY 90 tablet 1  . CYCLOBENZAPRINE HCL 5 MG PO TABS  1-2 qhs 14 tablet 0  . METOPROLOL TARTRATE 100 MG PO TABS  TAKE ONE TABLET BY MOUTH TWICE DAILY 180 tablet 1  . PRAVASTATIN SODIUM 40 MG PO TABS  TAKE ONE TABLET BY MOUTH EVERY DAY AT BEDTIME 90 tablet 0    **LABS/APPOINTMENT OVER-DUE**    BP 179/94  Resp 22  SpO2  100%  Physical Exam  Nursing note and vitals reviewed. Constitutional: She is oriented to person, place, and time. She appears well-developed and well-nourished. No distress.  HENT:  Head: Normocephalic and atraumatic.  Eyes: Pupils are equal, round, and reactive to light.  Neck: Normal range of motion. Neck supple.  Cardiovascular: Normal rate, regular rhythm and normal heart sounds.  Exam reveals no gallop and no friction rub.   No murmur heard. Pulmonary/Chest: Effort normal and breath sounds normal.  Abdominal: Soft. Bowel sounds are normal. She exhibits no distension and no mass. There is no rigidity, no rebound, no guarding, no tenderness at McBurney's point and negative Murphy's sign. No hernia.    Neurological: She is alert and oriented to person, place, and time.  Skin: Skin is warm and dry. No rash noted.    ED Course  Procedures (including critical care time)  Labs Reviewed  DIFFERENTIAL - Abnormal; Notable for the following:    Neutrophils Relative 81 (*)    Monocytes Relative 2 (*)    All other components within normal limits  CBC  URINALYSIS, ROUTINE W REFLEX MICROSCOPIC  COMPREHENSIVE METABOLIC PANEL  LIPASE, BLOOD   on reexamination of the patient states her abdominal pain, is now down to 3/10.  The  patient is advised, that she does have an incidental finding of a mass on her left ovary and that I would speak with the on-call GYN.  I spoke with Dr. Su Hilt from GYN.  She says if the patient is not having significant pain or other symptoms associated with this finding that we could have her followup in her office.  Patient is advised, that she will need followup for this finding and voices an understanding of this.  Patient will be given further pain control and anti-emetics at this time.  The patient will most likely be discharged home with followup with urology as well.  I rechecked the patient x2, since she has been in the emergency department.  9:43 AM.  The  patient and she is having a reaction to the Dilaudid as far as it is making her feel swimmy headed, room spinning, and continued nausea.  His pain is better other than the feeling she is having from the Dilaudid.  She states that she is sensitive to pain meds.  I advised her we will continue to watch and see if we can help her feel better.  12:27 PM the patient is feeling improved and we will do an oral fluid trial.  Patient is advised that she will most likely due home following this trial. MDM   MDM Reviewed: vitals, nursing note and previous chart Interpretation: labs and CT scan Consults: OB/GYN           Carlyle Dolly, PA-C 08/22/11 1243

## 2011-08-22 NOTE — ED Provider Notes (Signed)
Medical screening examination/treatment/procedure(s) were performed by non-physician practitioner and as supervising physician I was immediately available for consultation/collaboration.  F/u arranged with GYN for possible left ovarian mass.  Flint Melter, MD 08/22/11 [phone]

## 2011-08-22 NOTE — ED Notes (Signed)
Report received from Night RN. First contact with patient. Pt finished contrast, waiting for CT. Reports pain is better compare to first came in. Request for pain med, will ask EDP. BP is 157/105

## 2011-08-22 NOTE — ED Notes (Signed)
Pt's [place]: [phone]

## 2011-08-22 NOTE — Discharge Instructions (Signed)
You will need to followup the urologist provided by calling their office in the morning.  You also need to followup with the GYN doctor.  This provided for further evaluation of this mass that is on your left ovary.Return here for any worsening in your condition.

## 2011-08-25 NOTE — ED Provider Notes (Signed)
Medical screening examination/treatment/procedure(s) were performed by non-physician practitioner and as supervising physician I was immediately available for consultation/collaboration.  Flint Melter, MD 08/25/11 [phone]

## 2011-08-26 ENCOUNTER — Encounter: Payer: Self-pay | Admitting: Obstetrics and Gynecology

## 2011-10-05 ENCOUNTER — Other Ambulatory Visit: Payer: Self-pay | Admitting: Obstetrics & Gynecology

## 2011-10-05 HISTORY — PX: ABDOMINAL HYSTERECTOMY: SHX81

## 2011-10-05 HISTORY — PX: OOPHORECTOMY: SHX86

## 2011-10-19 ENCOUNTER — Encounter (HOSPITAL_COMMUNITY): Payer: Self-pay | Admitting: Anesthesiology

## 2011-10-19 ENCOUNTER — Inpatient Hospital Stay (HOSPITAL_COMMUNITY): Payer: BC Managed Care – PPO | Admitting: Anesthesiology

## 2011-10-19 ENCOUNTER — Ambulatory Visit (HOSPITAL_COMMUNITY)
Admission: AD | Admit: 2011-10-19 | Discharge: 2011-10-21 | Disposition: A | Discharge status: Home / Self Care | Payer: BC Managed Care – PPO | Source: Ambulatory Visit | Attending: Obstetrics and Gynecology | Admitting: Obstetrics and Gynecology

## 2011-10-19 ENCOUNTER — Encounter (HOSPITAL_COMMUNITY): Payer: Self-pay | Admitting: *Deleted

## 2011-10-19 ENCOUNTER — Inpatient Hospital Stay (HOSPITAL_COMMUNITY): Payer: BC Managed Care – PPO

## 2011-10-19 ENCOUNTER — Encounter (HOSPITAL_COMMUNITY)
Admission: AD | Disposition: A | Discharge status: Home / Self Care | Payer: Self-pay | Source: Ambulatory Visit | Attending: Obstetrics and Gynecology

## 2011-10-19 DIAGNOSIS — E785 Hyperlipidemia, unspecified: Secondary | ICD-10-CM

## 2011-10-19 DIAGNOSIS — E119 Type 2 diabetes mellitus without complications: Secondary | ICD-10-CM

## 2011-10-19 DIAGNOSIS — Z8679 Personal history of other diseases of the circulatory system: Secondary | ICD-10-CM

## 2011-10-19 DIAGNOSIS — T8130XA Disruption of wound, unspecified, initial encounter: Secondary | ICD-10-CM | POA: Insufficient documentation

## 2011-10-19 DIAGNOSIS — R9431 Abnormal electrocardiogram [ECG] [EKG]: Secondary | ICD-10-CM

## 2011-10-19 DIAGNOSIS — I10 Essential (primary) hypertension: Secondary | ICD-10-CM

## 2011-10-19 HISTORY — PX: OTHER SURGICAL HISTORY: SHX169

## 2011-10-19 HISTORY — DX: Calculus of kidney: N20.0

## 2011-10-19 HISTORY — DX: Hyperlipidemia, unspecified: E78.5

## 2011-10-19 LAB — TYPE AND SCREEN: Antibody Screen: NEGATIVE

## 2011-10-19 LAB — CBC WITH DIFFERENTIAL/PLATELET
Eosinophils Relative: 1 % (ref 0–5)
Hemoglobin: 11.6 g/dL — ABNORMAL LOW (ref 12.0–15.0)
Lymphocytes Relative: 28 % (ref 12–46)
Lymphs Abs: 3 10*3/uL (ref 0.7–4.0)
MCH: 26 pg (ref 26.0–34.0)
MCV: 84.8 fL (ref 78.0–100.0)
Monocytes Relative: 5 % (ref 3–12)
Neutrophils Relative %: 66 % (ref 43–77)
Platelets: 445 10*3/uL — ABNORMAL HIGH (ref 150–400)
RBC: 4.47 MIL/uL (ref 3.87–5.11)
WBC: 10.9 10*3/uL — ABNORMAL HIGH (ref 4.0–10.5)

## 2011-10-19 LAB — URINE MICROSCOPIC-ADD ON

## 2011-10-19 LAB — URINALYSIS, ROUTINE W REFLEX MICROSCOPIC
Bilirubin Urine: NEGATIVE
Ketones, ur: NEGATIVE mg/dL
Nitrite: NEGATIVE
Specific Gravity, Urine: >1.03 — ABNORMAL HIGH (ref 1.005–1.030)
Urobilinogen, UA: 0.2 mg/dL (ref 0.0–1.0)

## 2011-10-19 SURGERY — REPAIR, VAGINAL CUFF
Anesthesia: Monitor Anesthesia Care | Site: Vagina | Wound class: Clean Contaminated

## 2011-10-19 MED ORDER — ZOLPIDEM TARTRATE 5 MG PO TABS: 5.0000 mg | ORAL_TABLET | Freq: Every evening | ORAL | Status: DC | PRN

## 2011-10-19 MED ORDER — BUTORPHANOL TARTRATE 1 MG/ML IJ SOLN: 1.0000 mg | INTRAMUSCULAR | Status: DC | PRN

## 2011-10-19 MED ORDER — DIPHENHYDRAMINE HCL 12.5 MG/5ML PO ELIX: 12.5000 mg | ORAL_SOLUTION | Freq: Four times a day (QID) | ORAL | Status: DC | PRN

## 2011-10-19 MED ORDER — LIDOCAINE HCL (CARDIAC) 20 MG/ML IV SOLN
INTRAVENOUS | Status: DC | PRN
  Administered 2011-10-19: 80 mg via INTRAVENOUS

## 2011-10-19 MED ORDER — BENAZEPRIL HCL 10 MG PO TABS
20.0000 mg | ORAL_TABLET | Freq: Every day | ORAL | Status: DC
  Administered 2011-10-20 – 2011-10-21 (×2): 20 mg via ORAL
  Filled 2011-10-19 (×3): qty 2

## 2011-10-19 MED ORDER — KETOROLAC TROMETHAMINE 30 MG/ML IJ SOLN
30.0000 mg | Freq: Four times a day (QID) | INTRAMUSCULAR | Status: DC
  Administered 2011-10-20: 30 mg via INTRAVENOUS
  Filled 2011-10-19: qty 1

## 2011-10-19 MED ORDER — MIDAZOLAM HCL 5 MG/5ML IJ SOLN
INTRAMUSCULAR | Status: DC | PRN
  Administered 2011-10-19: 2 mg via INTRAVENOUS

## 2011-10-19 MED ORDER — CEFAZOLIN SODIUM-DEXTROSE 2-3 GM-% IV SOLR
2.0000 g | Freq: Once | INTRAVENOUS | Status: AC
  Administered 2011-10-19: 2 g via INTRAVENOUS

## 2011-10-19 MED ORDER — KETOROLAC TROMETHAMINE 30 MG/ML IJ SOLN
INTRAMUSCULAR | Status: AC
  Administered 2011-10-19: 30 mg via INTRAVENOUS
  Filled 2011-10-19: qty 1

## 2011-10-19 MED ORDER — ONDANSETRON HCL 4 MG PO TABS
4.0000 mg | ORAL_TABLET | Freq: Four times a day (QID) | ORAL | Status: DC | PRN
  Administered 2011-10-20: 4 mg via ORAL
  Filled 2011-10-19: qty 1

## 2011-10-19 MED ORDER — KETOROLAC TROMETHAMINE 30 MG/ML IJ SOLN
30.0000 mg | Freq: Once | INTRAMUSCULAR | Status: AC
  Administered 2011-10-19: 30 mg via INTRAVENOUS

## 2011-10-19 MED ORDER — PROPOFOL 10 MG/ML IV EMUL
INTRAVENOUS | Status: DC | PRN
  Administered 2011-10-19: 150 mg via INTRAVENOUS

## 2011-10-19 MED ORDER — FENTANYL CITRATE 0.05 MG/ML IJ SOLN
25.0000 ug | INTRAMUSCULAR | Status: DC | PRN
  Administered 2011-10-19: 25 ug via INTRAVENOUS

## 2011-10-19 MED ORDER — ONDANSETRON HCL 4 MG/2ML IJ SOLN: 4.0000 mg | Freq: Four times a day (QID) | INTRAMUSCULAR | Status: DC | PRN

## 2011-10-19 MED ORDER — SODIUM CHLORIDE 0.9 % IJ SOLN: 9.0000 mL | INTRAMUSCULAR | Status: DC | PRN

## 2011-10-19 MED ORDER — DEXTROSE IN LACTATED RINGERS 5 % IV SOLN
INTRAVENOUS | Status: DC
  Administered 2011-10-20: via INTRAVENOUS

## 2011-10-19 MED ORDER — LACTATED RINGERS IV SOLN
INTRAVENOUS | Status: DC
  Administered 2011-10-19 (×2): via INTRAVENOUS

## 2011-10-19 MED ORDER — MEPERIDINE HCL 25 MG/ML IJ SOLN: 6.2500 mg | INTRAMUSCULAR | Status: DC | PRN

## 2011-10-19 MED ORDER — DIPHENHYDRAMINE HCL 50 MG/ML IJ SOLN: 12.5000 mg | Freq: Four times a day (QID) | INTRAMUSCULAR | Status: DC | PRN

## 2011-10-19 MED ORDER — KETOROLAC TROMETHAMINE 30 MG/ML IJ SOLN: 30.0000 mg | Freq: Four times a day (QID) | INTRAMUSCULAR | Status: DC

## 2011-10-19 MED ORDER — MENTHOL 3 MG MT LOZG: 1.0000 | LOZENGE | OROMUCOSAL | Status: DC | PRN

## 2011-10-19 MED ORDER — 0.9 % SODIUM CHLORIDE (POUR BTL) OPTIME
TOPICAL | Status: DC | PRN
  Administered 2011-10-19: 1000 mL

## 2011-10-19 MED ORDER — IBUPROFEN 800 MG PO TABS
800.0000 mg | ORAL_TABLET | Freq: Three times a day (TID) | ORAL | Status: DC | PRN
  Administered 2011-10-20 (×2): 800 mg via ORAL
  Filled 2011-10-19 (×2): qty 1

## 2011-10-19 MED ORDER — CITRIC ACID-SODIUM CITRATE 334-500 MG/5ML PO SOLN
ORAL | Status: AC
  Administered 2011-10-19: 21:00:00
  Filled 2011-10-19: qty 15

## 2011-10-19 MED ORDER — NALOXONE HCL 0.4 MG/ML IJ SOLN: 0.4000 mg | INTRAMUSCULAR | Status: DC | PRN

## 2011-10-19 MED ORDER — DEXAMETHASONE SODIUM PHOSPHATE 10 MG/ML IJ SOLN
INTRAMUSCULAR | Status: DC | PRN
  Administered 2011-10-19: 10 mg via INTRAVENOUS

## 2011-10-19 MED ORDER — CITRIC ACID-SODIUM CITRATE 334-500 MG/5ML PO SOLN: 30.0000 mL | Freq: Once | ORAL | Status: DC

## 2011-10-19 MED ORDER — METOCLOPRAMIDE HCL 5 MG/ML IJ SOLN: 10.0000 mg | Freq: Once | INTRAMUSCULAR | Status: DC | PRN

## 2011-10-19 MED ORDER — MORPHINE SULFATE (PF) 1 MG/ML IV SOLN
INTRAVENOUS | Status: DC
  Administered 2011-10-20: 1 mg via INTRAVENOUS
  Administered 2011-10-20: via INTRAVENOUS
  Filled 2011-10-19: qty 25

## 2011-10-19 MED ORDER — FENTANYL CITRATE 0.05 MG/ML IJ SOLN
INTRAMUSCULAR | Status: DC | PRN
  Administered 2011-10-19: 100 ug via INTRAVENOUS

## 2011-10-19 MED ORDER — ONDANSETRON HCL 4 MG/2ML IJ SOLN
INTRAMUSCULAR | Status: DC | PRN
  Administered 2011-10-19: 4 mg via INTRAVENOUS

## 2011-10-19 MED ORDER — CEFAZOLIN SODIUM-DEXTROSE 2-3 GM-% IV SOLR
2.0000 g | INTRAVENOUS | Status: DC
  Filled 2011-10-19: qty 50

## 2011-10-19 MED ORDER — OXYCODONE-ACETAMINOPHEN 5-325 MG PO TABS
1.0000 | ORAL_TABLET | ORAL | Status: DC | PRN
  Administered 2011-10-20: 1 via ORAL
  Filled 2011-10-19: qty 1

## 2011-10-19 MED ORDER — FENTANYL CITRATE 0.05 MG/ML IJ SOLN
INTRAMUSCULAR | Status: AC
  Administered 2011-10-19: 25 ug via INTRAVENOUS
  Filled 2011-10-19: qty 2

## 2011-10-19 SURGICAL SUPPLY — 18 items
CATH ROBINSON RED A/P 16FR (CATHETERS) ×1 IMPLANT
GAUZE PACKING 1 X5 YD ST (GAUZE/BANDAGES/DRESSINGS) ×1 IMPLANT
GLOVE BIO SURGEON STRL SZ7 (GLOVE) ×2 IMPLANT
GLOVE SKINSENSE NS SZ7.5 (GLOVE) ×1
GLOVE SKINSENSE NS SZ8.0 LF (GLOVE) ×1
GLOVE SKINSENSE STRL SZ7.5 (GLOVE) IMPLANT
GLOVE SKINSENSE STRL SZ8.0 LF (GLOVE) IMPLANT
GOWN SURGICAL LARGE (GOWNS) ×2 IMPLANT
PACK VAGINAL MINOR WOMEN LF (CUSTOM PROCEDURE TRAY) ×1 IMPLANT
PAD PREP 24X48 CUFFED NSTRL (MISCELLANEOUS) ×1 IMPLANT
SLEEVE SCD COMPRESS KNEE MED (MISCELLANEOUS) ×1 IMPLANT
SUT VIC AB 2-0 CT1 (SUTURE) ×2 IMPLANT
SUT VIC AB 2-0 CT1 27 (SUTURE) ×2
SUT VIC AB 2-0 CT1 TAPERPNT 27 (SUTURE) IMPLANT
TOWEL OR 17X24 6PK STRL BLUE (TOWEL DISPOSABLE) ×2 IMPLANT
TRAY FOLEY CATH 14FR (SET/KITS/TRAYS/PACK) ×1 IMPLANT
TUBING NON-CON 1/4 X 20 CONN (TUBING) ×1 IMPLANT
YANKAUER SUCT BULB TIP NO VENT (SUCTIONS) ×1 IMPLANT

## 2011-10-19 NOTE — Transfer of Care (Signed)
Immediate Anesthesia Transfer of Care Note  Patient: Cynthia Forbes  Procedure(s) Performed: Procedure(s) (LRB): REPAIR VAGINAL CUFF (N/A)  Patient Location: PACU  Anesthesia Type: General  Level of Consciousness: awake  Airway & Oxygen Therapy: Patient Spontanous Breathing and Patient connected to nasal cannula oxygen  Post-op Assessment: Report given to PACU RN and Post -op Vital signs reviewed and stable  Post vital signs: stable  Complications: No apparent anesthesia complications

## 2011-10-19 NOTE — Anesthesia Postprocedure Evaluation (Signed)
  Anesthesia Post-op Note  Patient: Cynthia Forbes  Procedure(s) Performed: Procedure(s) (LRB): REPAIR VAGINAL CUFF (N/A)  Patient Location: PACU  Anesthesia Type: General  Level of Consciousness: awake, alert  and oriented  Airway and Oxygen Therapy: Patient Spontanous Breathing  Post-op Pain: none  Post-op Assessment: Post-op Vital signs reviewed, Patient's Cardiovascular Status Stable, Respiratory Function Stable, Patent Airway, No signs of Nausea or vomiting and Pain level controlled  Post-op Vital Signs: Reviewed and stable  Complications: No apparent anesthesia complications

## 2011-10-19 NOTE — Op Note (Signed)
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  EBL: 400 cc, mostly old blood clot.  Specimens removed: None  Procedure and findings:  The patient taken the operating room after an adequate level of general anesthesia was obtained with the patient's legs in stirrups the perineum and vagina were prepped and draped in usual fashion for vaginal procedures. Appropriate timeout for taken. Bladder drained with a red rubber catheter. He UA carried out no masses were palpable weighted speculum was positioned anterior retractor was then used for exposure suction was then used to define the vaginal cuff which is partially separated in the middle there was no evidence of any bowel or omental herniation in this area most of this was generalized venous bleeding, dark blood I did not see any arterial bleeding. 2-0 Vicryl interrupted sutures and then used to reapproximate the vaginal cuff right to left lung is entirely. This was observed and noted be hemostatic vaginal pack was position Foley catheter positioned draining clear urine she will be observed overnight went to PACU in stable condition  Dictated with [facility]  Cynthia Forbes

## 2011-10-19 NOTE — MAU Note (Signed)
Hysterectomy on July 9. Started bleeding a little heavier yesterday. Started gushing today around 5pm and massive large clots.

## 2011-10-19 NOTE — Anesthesia Preprocedure Evaluation (Addendum)
Anesthesia Evaluation  Patient identified by MRN, date of birth, ID band Patient awake    Reviewed: Allergy & Precautions, H&P , NPO status , Patient's Chart, lab work & pertinent test results, reviewed documented beta blocker date and time   Airway Mallampati: III TM Distance: >3 FB Neck ROM: Full    Dental No notable dental hx. (+) Teeth Intact   Pulmonary neg pulmonary ROS,  breath sounds clear to auscultation  Pulmonary exam normal       Cardiovascular hypertension, Pt. on medications and Pt. on home beta blockers Rhythm:Regular Rate:Normal     Neuro/Psych Anxiety negative neurological ROS     GI/Hepatic negative GI ROS, Neg liver ROS,   Endo/Other  Well Controlled, Type 2  Renal/GU negative Renal ROS  negative genitourinary   Musculoskeletal negative musculoskeletal ROS (+)   Abdominal (+) + obese,   Peds  Hematology negative hematology ROS (+)   Anesthesia Other Findings   Reproductive/Obstetrics negative OB ROS                           Anesthesia Physical Anesthesia Plan  ASA: II and Emergent  Anesthesia Plan: General   Post-op Pain Management:    Induction: Intravenous  Airway Management Planned: LMA  Additional Equipment:   Intra-op Plan:   Post-operative Plan: Extubation in OR  Informed Consent: I have reviewed the patients History and Physical, chart, labs and discussed the procedure including the risks, benefits and alternatives for the proposed anesthesia with the patient or authorized representative who has indicated his/her understanding and acceptance.   Dental advisory given  Plan Discussed with: CRNA, Anesthesiologist and Surgeon  Anesthesia Plan Comments:        Anesthesia Quick Evaluation

## 2011-10-19 NOTE — H&P (Signed)
Cynthia Forbes is an 61 y.o. female. S/P LAVH by Dr Jennette Kettle at the surgical center 12 days ago, presents to MAU with increased bleeding and clots  Pertinent Gynecological History: Menses: post op Bleeding: post op bleeding Contraception: none DES exposure: denies Blood transfusions: none Sexually transmitted diseases: no past history Previous GYN Procedures: LAVH  Last mammogram: normal Date: unk Last pap: unk Date: unk OB History:   Menstrual History: Menarche age: unk No LMP recorded. Patient is postmenopausal.    Past Medical History  Diagnosis Date  . Hypertension   . Bronchitis   . Kidney stones   . Hyperlipemia     Past Surgical History  Procedure Date  . Tonsillectomy   . Abdominal hysterectomy   . Oophorectomy     History reviewed. No pertinent family history.  Social History:  reports that she has never smoked. She does not have any smokeless tobacco history on file. She reports that she does not drink alcohol or use illicit drugs.  Allergies:  Allergies  Allergen Reactions  . Codeine     Rash , tachycardia & dyspnea  . Ivp Dye (Iodinated Diagnostic Agents) Hives and Shortness Of Breath  . Strawberry Hives    Prescriptions prior to admission  Medication Sig Dispense Refill  . aspirin 81 MG tablet Take 81 mg by mouth daily.      . benazepril (LOTENSIN) 20 MG tablet TAKE ONE TABLET BY MOUTH EVERY DAY  90 tablet  1  . cyclobenzaprine (FLEXERIL) 5 MG tablet Take 5-10 mg by mouth 3 (three) times daily as needed. For pain      . metoprolol (LOPRESSOR) 100 MG tablet TAKE ONE TABLET BY MOUTH TWICE DAILY  180 tablet  1  . oxyCODONE-acetaminophen (PERCOCET/ROXICET) 5-325 MG per tablet Take 1 tablet by mouth every 4 (four) hours as needed. For pain      . pravastatin (PRAVACHOL) 40 MG tablet TAKE ONE TABLET BY MOUTH EVERY DAY AT BEDTIME  90 tablet  0  . promethazine (PHENERGAN) 25 MG tablet Take 1 tablet (25 mg total) by mouth every 6 (six) hours as needed for  nausea.  10 tablet  0    Review of Systems  Constitutional: Negative.   HENT: Negative.   Eyes: Negative.   Respiratory: Negative.   Cardiovascular: Negative.   Genitourinary:       Vag bleeding  Musculoskeletal: Negative.   Skin: Negative.   Neurological: Negative.   Endo/Heme/Allergies: Negative.     Blood pressure 183/109, pulse 95, temperature 100.6 F (38.1 C), temperature source Oral, resp. rate 20. Physical Exam  Constitutional: She is oriented to person, place, and time. She appears well-developed and well-nourished.  HENT:  Head: Normocephalic and atraumatic.  Eyes: Pupils are equal, round, and reactive to light.  Neck: Normal range of motion. Neck supple.  Cardiovascular: Normal rate and regular rhythm.   Respiratory: Effort normal and breath sounds normal.  GI: Soft.       umbilical inc is C/D  Genitourinary:       BRB and clot from cuff, difficult to tell in MAU, but appears to be cuff bleeder  Musculoskeletal: Normal range of motion.  Neurological: She is alert and oriented to person, place, and time.  Skin: Skin is warm and dry.    Results for orders placed during the hospital encounter of 10/19/11 (from the past 24 hour(s))  CBC WITH DIFFERENTIAL     Status: Abnormal   Collection Time   10/19/11  7:13  PM      Component Value Range   WBC 10.9 (*) 4.0 - 10.5 K/uL   RBC 4.47  3.87 - 5.11 MIL/uL   Hemoglobin 11.6 (*) 12.0 - 15.0 g/dL   HCT 96.0  45.4 - 09.8 %   MCV 84.8  78.0 - 100.0 fL   MCH 26.0  26.0 - 34.0 pg   MCHC 30.6  30.0 - 36.0 g/dL   RDW 11.9  14.7 - 82.9 %   Platelets 445 (*) 150 - 400 K/uL   Neutrophils Relative 66  43 - 77 %   Neutro Abs 7.2  1.7 - 7.7 K/uL   Lymphocytes Relative 28  12 - 46 %   Lymphs Abs 3.0  0.7 - 4.0 K/uL   Monocytes Relative 5  3 - 12 %   Monocytes Absolute 0.5  0.1 - 1.0 K/uL   Eosinophils Relative 1  0 - 5 %   Eosinophils Absolute 0.1  0.0 - 0.7 K/uL   Basophils Relative 0  0 - 1 %   Basophils Absolute 0.0  0.0  - 0.1 K/uL    No results found.  Assessment/Plan: Post op LAVH with cuff bleeder, discussed need for EUA and vag approach to suturing, poss need for DL, blood transfusion...other risks include infection, adjacent organ injury or need for other surgical procedures  Akili Cuda M 10/19/2011, 9:06 PM

## 2011-10-19 NOTE — MAU Note (Signed)
Patient states she had a LAVH 2 weeks ago done by Dr. Jennette Kettle. Started bleeding a little after 1700 today and states she has had nine episodes of heavy bleeding with passing large clots. In the bathroom patient had heavy bleeding with several large clots in the toilet. Patient denies any pain.

## 2011-10-19 NOTE — MAU Provider Note (Signed)
History     CSN: [id number]  Arrival date and time: 10/19/11 4782   First Provider Initiated Contact with Patient 10/19/11 1910      Chief Complaint  Patient presents with  . Vaginal Bleeding   HPI Cynthia Forbes is 61 y.o. patient of Dr. Marcelina Morel, presents with sudden onset of heavy vaginal bleeding today at 5.  Had TAH, BSO on 7/9 at surgical center.  She passed kidney stone mid June.  States she has had spotting daily since precedure, a little heavier today until 5pm very heavy sudden bleeding with large clots.  Came in MAU with soaked pad.  Having cramps.  Has percocet at home but hasn't used it.   Denies vaginal penetration.    Past Medical History  Diagnosis Date  . Hypertension   . Bronchitis     Past Surgical History  Procedure Date  . Tonsillectomy     No family history on file.  History  Substance Use Topics  . Smoking status: Never Smoker   . Smokeless tobacco: Not on file  . Alcohol Use: No    Allergies:  Allergies  Allergen Reactions  . Codeine     Rash , tachycardia & dyspnea    Prescriptions prior to admission  Medication Sig Dispense Refill  . aspirin 81 MG tablet Take 81 mg by mouth daily.      . benazepril (LOTENSIN) 20 MG tablet TAKE ONE TABLET BY MOUTH EVERY DAY  90 tablet  1  . metoprolol (LOPRESSOR) 100 MG tablet TAKE ONE TABLET BY MOUTH TWICE DAILY  180 tablet  1  . pravastatin (PRAVACHOL) 40 MG tablet TAKE ONE TABLET BY MOUTH EVERY DAY AT BEDTIME  90 tablet  0  . promethazine (PHENERGAN) 25 MG tablet Take 1 tablet (25 mg total) by mouth every 6 (six) hours as needed for nausea.  10 tablet  0  . Tamsulosin HCl (FLOMAX) 0.4 MG CAPS Take 1 capsule (0.4 mg total) by mouth daily.  4 capsule  0    Review of Systems  Constitutional: Negative for fever and chills.  Respiratory: Negative.   Cardiovascular: Negative.   Gastrointestinal: Positive for abdominal pain (cramps). Negative for nausea and vomiting.  Genitourinary:       + vaginal  bleeding   Physical Exam   Blood pressure 183/109, pulse 95, resp. rate 20.  Physical Exam  Constitutional: She is oriented to person, place, and time. She appears well-developed and well-nourished. No distress.  HENT:  Head: Normocephalic.  Neck: Normal range of motion.  Cardiovascular: Normal rate.   Respiratory: Effort normal.  GI: Soft. She exhibits no distension and no mass. There is no tenderness. There is no rebound and no guarding.  Genitourinary:       Large amount of bright red blood in the vaginal with 3 large clots.  Difficult to see cervical cuff because of the heavy bleeding  Neurological: She is alert and oriented to person, place, and time.  Skin: Skin is warm.   Results for orders placed during the hospital encounter of 10/19/11 (from the past 24 hour(s))  CBC WITH DIFFERENTIAL     Status: Abnormal   Collection Time   10/19/11  7:13 PM      Component Value Range   WBC 10.9 (*) 4.0 - 10.5 K/uL   RBC 4.47  3.87 - 5.11 MIL/uL   Hemoglobin 11.6 (*) 12.0 - 15.0 g/dL   HCT 95.6  21.3 - 08.6 %   MCV 84.8  78.0 - 100.0 fL   MCH 26.0  26.0 - 34.0 pg   MCHC 30.6  30.0 - 36.0 g/dL   RDW 16.1  09.6 - 04.5 %   Platelets 445 (*) 150 - 400 K/uL   Neutrophils Relative 66  43 - 77 %   Neutro Abs 7.2  1.7 - 7.7 K/uL   Lymphocytes Relative 28  12 - 46 %   Lymphs Abs 3.0  0.7 - 4.0 K/uL   Monocytes Relative 5  3 - 12 %   Monocytes Absolute 0.5  0.1 - 1.0 K/uL   Eosinophils Relative 1  0 - 5 %   Eosinophils Absolute 0.1  0.0 - 0.7 K/uL   Basophils Relative 0  0 - 1 %   Basophils Absolute 0.0  0.0 - 0.1 K/uL   MAU Course  Procedures  MDM 18:30  Reported MSE and labs to Dr. Marcelle Overlie.  Order given for IV LR at 200cc/hr, pelvic ultrasound, and in and out cath  20:00 Care turned over to V. Katrinka Blazing, CNM  Assessment and Plan  A:  Post operative bleeding  KEY,EVE M 10/19/2011, 7:10 PM   Care of pt assumed at 2000. Passed 5x10 cm clot per RN. RNs unable to start IV.  Anesthesia coming to try. T&S ordered.    To OR per Dr. Marcelle Overlie.   [place], [place] 10/19/2011 9:33 PM

## 2011-10-20 LAB — CBC
Hemoglobin: 9.7 g/dL — ABNORMAL LOW (ref 12.0–15.0)
RBC: 3.73 MIL/uL — ABNORMAL LOW (ref 3.87–5.11)
WBC: 17.3 10*3/uL — ABNORMAL HIGH (ref 4.0–10.5)

## 2011-10-20 MED ORDER — POLYETHYLENE GLYCOL 3350 17 G PO PACK
17.0000 g | PACK | Freq: Two times a day (BID) | ORAL | Status: DC
  Administered 2011-10-20 – 2011-10-21 (×3): 17 g via ORAL
  Filled 2011-10-20 (×5): qty 1

## 2011-10-20 NOTE — Progress Notes (Signed)
Pt tolerating regular diet.  Pain controlled.  No further VB  Gen - Pt very anxious, NAD Abd - soft, NT, ND PV - packing removed, no active bleeding after removal Ext - SCD in place  Labs reviewed, Hgb 9.7  A/P:  Pt lives alone, uncomfortable w/ d/c home today Plan for transition to PO meds, d/c foley and obs for any further vb today Advance diet as tolerated Encourage ambulation

## 2011-10-20 NOTE — Addendum Note (Signed)
Addendum  created 10/20/11 1436 by Gertie Fey, CRNA   Modules edited:Notes Section

## 2011-10-20 NOTE — Anesthesia Postprocedure Evaluation (Signed)
  Anesthesia Post-op Note  Patient: Cynthia Forbes  Procedure(s) Performed: Procedure(s) (LRB): REPAIR VAGINAL CUFF (N/A)  Patient Location: PACU and Women's Unit  Anesthesia Type: General  Level of Consciousness: awake, alert  and oriented  Airway and Oxygen Therapy: Patient Spontanous Breathing  Post-op Pain: none  Post-op Assessment: Post-op Vital signs reviewed  Post-op Vital Signs: Reviewed and stable  Complications: No apparent anesthesia complications

## 2011-10-21 NOTE — Progress Notes (Signed)
2 Days Post-Op Procedure(s) (LRB): REPAIR VAGINAL CUFF (N/A)  Subjective: Patient reports tolerating PO, + flatus and no problems voiding.    Objective: I have reviewed patient's vital signs, intake and output and medications.  General: alert, cooperative and appears stated age Vaginal Bleeding: none abdomen is soft and non tender  Assessment: s/p Procedure(s) (LRB): REPAIR VAGINAL CUFF (N/A): stable and progressing well  Plan: Advance diet Discharge home  LOS: 2 days    Takeisha Cianci L 10/21/2011, 8:35 AM

## 2011-10-21 NOTE — Progress Notes (Signed)
Ms Sharrar was in good spirits when I visited with her, but she was also exhausted by having had 2 major surgeries within 2 weeks.  The second surgery gave her quite a scare and she is naturally worried now about what else might happen.  She has good friends here, but the rest of her family, besides her children, are not local and many are deceased.    I offered emotional and spiritual support.  Chaplain Dyanne Carrel [phone] 10:40 AM   10/21/11 1000  Clinical Encounter Type  Visited With Patient and family together  Visit Type Spiritual support  Spiritual Encounters  Spiritual Needs Emotional  Stress Factors  Patient Stress Factors (2 major surgeries within 2 weeks)

## 2011-10-21 NOTE — Discharge Summary (Signed)
Admission Diagnosis: Dehiscence of vaginal cuff  Discharge diagnosis: Same  Hospital Course: 61 year old female status post LAVH 2 weeks ago presented with acute dehiscence of vaginal cuff. She has done well post op. She is discharged home at POD #2.  She will follow up with Dr. Jennette Kettle in 2 weeks.

## 2012-01-06 ENCOUNTER — Telehealth: Payer: Self-pay | Admitting: Internal Medicine

## 2012-01-06 NOTE — Telephone Encounter (Signed)
Caller: Reka/Patient; Patient Name: Cynthia Forbes; PCP: Marga Melnick; Best Callback Phone Number: [phone], she has had neck stiffness and pain down left neck wiith movement  since 12/25/11, tried massage and pain meds from prior surgery, also Motrin 800mg   and not effective,she is asking to be seen, denies emergent symptoms per Neck Pain Guideline,see in 72 hrs disposition due to home care not effective,call after 5pm,no open appointments noted for PCP for 01/07/12, she will call in am for appointment,Home Care Advise given,Motrin 800mg  PO q 8 hrs as needed for pain,moist heat per care advise,callback perimeters gone over

## 2012-01-06 NOTE — Telephone Encounter (Signed)
I spoke with patient and she stated that this started as a crook in her neck. Patient states this is going on x 2 weeks. Patient denies left side arm pain or chest pain. Patient aware if she develops chest pain, SOB, left side numbness/pain/tingling to seek medical attention quickly at ER, otherwise patient to be seen tomorrow at 1 pm

## 2012-01-07 ENCOUNTER — Encounter: Payer: Self-pay | Admitting: Internal Medicine

## 2012-01-07 ENCOUNTER — Ambulatory Visit (INDEPENDENT_AMBULATORY_CARE_PROVIDER_SITE_OTHER): Payer: BC Managed Care – PPO | Admitting: Internal Medicine

## 2012-01-07 VITALS — BP 140/90 | HR 69 | Temp 98.2°F | Resp 16 | Wt 215.0 lb

## 2012-01-07 DIAGNOSIS — I10 Essential (primary) hypertension: Secondary | ICD-10-CM

## 2012-01-07 DIAGNOSIS — M5412 Radiculopathy, cervical region: Secondary | ICD-10-CM

## 2012-01-07 MED ORDER — HYDROCODONE-ACETAMINOPHEN 5-500 MG PO TABS: ORAL_TABLET | ORAL | Status: DC

## 2012-01-07 MED ORDER — CYCLOBENZAPRINE HCL 5 MG PO TABS: ORAL_TABLET | ORAL | Status: DC

## 2012-01-07 NOTE — Progress Notes (Signed)
Subjective:    Patient ID: Cynthia Forbes, female    DOB: 1951/03/06, 61 y.o.   MRN: [id number]  HPI Symptoms began 13 days ago as left-sided neck pain upon awakening. Initially she described a "crick" with right neck rotation; but subsequently she developed a "crick" with left neck rotation.  The pain has progressed; it is described as sharp and intermittent. It is aggravated by right lateral neck rotation. It radiates to the left trapezius and upper/anterior shoulder area.  Massage therapy 01/05/12 actually may have aggravated her symptoms; certainly it did not relieve them.    Review of Systems She's had no similar episodes in the past. There was no trauma , overuse, hyperextension, or repetitive motion prior to onset of symptoms.  When the pain was at its greatest; she did have some tingling of the upper lateral shoulder area, but this has not recurred. There's been no associated weakness in the arm. She denies any bowel or bladder dysfunction.  There's been no associated rash, fever, or chills.  Significantly she did have extensive gynecologic surgery earlier this year. While being evaluated for renal calculi; an incidental finding on CT urogram was an ovarian mass. It proved to be a degenerative fibroid; but the nephrectomy and hysterectomy her complicated by subsequent severe vaginal hemorrhage. Emergency surgery evacuated the hematoma.  She does have heat intolerance and flashes       Objective:   Physical Exam Gen.:  well-nourished in appearance. Alert, appropriate and cooperative throughout exam. Head: Normocephalic without obvious abnormalities  Eyes: No corneal or conjunctival inflammation noted.  Neck: No deformities, masses, or tenderness noted. Range of motion decreased laterally. Lungs: Normal respiratory effort; chest expands symmetrically. Lungs are clear to auscultation without rales, wheezes, or increased work of breathing. Heart: Normal rate and rhythm. Normal S1 and  S2. No gallop, click, or rub. No murmur.                                                                                  Musculoskeletal/extremities: No deformity or scoliosis noted of  the thoracic or lumbar spine. No clubbing, cyanosis, edema, or deformity noted. Tone & strength  normal.Joints normal. Nail health  good. There is some localized tenderness to palpation over the trapezius area on the left Vascular: Carotid & radial artery pulses are full and equal. No bruits present. Neurologic: Alert and oriented x3. Deep tendon reflexes symmetrical but 1/2+.Sensation intact UE bilaterally. Gait including tiptoe and heel walking is normal.        Skin: Intact without suspicious lesions or rashes. Lymph: No cervical, axillary lymphadenopathy present. Psych: Mood and affect are normal. Normally interactive                                                                                         Assessment & Plan:  #  1 cervical area pain with C4-5 distribution. No neurologic deficit present on exam #2 HTN aggravated by #1  Plan: See orders and recommendations.

## 2012-01-07 NOTE — Patient Instructions (Addendum)
Use an anti-inflammatory cream such as  Zostrix cream twice a day to the area as needed. In lieu of this warm moist compresses or  hot water bottle can be used. Do not apply ice .  Blood Pressure Goal  Ideally is an AVERAGE < 135/85. This AVERAGE should be calculated from @ least 5-7 BP readings taken @ different times of day on different days of week. You should not respond to isolated BP readings , but rather the AVERAGE for that week .  If you activate My Chart; the results can be released to you as soon as they populate from the lab. If you choose not to use this program; the labs have to be reviewed, copied & mailed   causing a delay in getting the results to you.

## 2012-01-10 ENCOUNTER — Ambulatory Visit: Payer: BC Managed Care – PPO | Admitting: Internal Medicine

## 2012-05-04 ENCOUNTER — Other Ambulatory Visit: Payer: Self-pay | Admitting: Internal Medicine

## 2012-09-05 ENCOUNTER — Ambulatory Visit (INDEPENDENT_AMBULATORY_CARE_PROVIDER_SITE_OTHER): Payer: BC Managed Care – PPO | Admitting: Family Medicine

## 2012-09-05 ENCOUNTER — Ambulatory Visit: Payer: BC Managed Care – PPO

## 2012-09-05 VITALS — BP 196/124 | HR 104 | Temp 99.0°F | Resp 16 | Ht 68.5 in | Wt 223.6 lb

## 2012-09-05 DIAGNOSIS — I10 Essential (primary) hypertension: Secondary | ICD-10-CM

## 2012-09-05 DIAGNOSIS — M79645 Pain in left finger(s): Secondary | ICD-10-CM

## 2012-09-05 DIAGNOSIS — M79609 Pain in unspecified limb: Secondary | ICD-10-CM

## 2012-09-05 NOTE — Patient Instructions (Addendum)
You can wear the brace for the next  4-5 days to rest the thumb then slowly restart use if improved.  Out of brace twice per day to move wrist and thumb gently.  If needed you can take one of you pain medicines at home, but avoid antiinflammatories as they can elevate your blood pressure. If pain not improving into next week - recheck office visit.   Keep a record of your blood pressures outside of the office and call your doctors office tomorrow to be seen this week if possible.  Let them know the readings you are having. If any chest pain, weakness, slurred speech, headaches or any new or worsening symptoms - call 911 or go to an emergency room.

## 2012-09-05 NOTE — Progress Notes (Signed)
Subjective:    Patient ID: Cynthia Forbes, female    DOB: 12-04-1950, 62 y.o.   MRN: [id number]  HPI Cynthia Forbes is a 62 y.o. female  L hand pain - 2 weeks ago getting dressed for work. Hit ceiling fan with L hand. Does not remember any initial pain, then  Sore last week - under thumb - felt stiff - hard to bend on own - stiff and sore to bend on own, abut able to bend with other finger. No hx of gout.  Tx: ice.  No NSAIDS.   L hand dominant.  Works for [employer].  Increased stress recently with work, increased typing recently with work.   HX of HTN -  Initial BP 196/124. 164/120 recheck. Home meter 146/126 at highest, down to 128/85 when taking meds. Missed doses of meds at times, but has been taking yesterday and today. PCP: Dr. Alwyn Ren at [facility]. No scheduled appt.  No current chest pain/weakness/slurred speech/lightheadness/dizziness/vision change or any new neurological symptoms in office.    Review of Systems  Genitourinary: Negative for difficulty urinating.  Musculoskeletal: Positive for arthralgias (L thumb. ).  Neurological: Negative for dizziness, speech difficulty, weakness, light-headedness and headaches (occasional ).       Objective:   Physical Exam  Vitals reviewed. Constitutional: She is oriented to person, place, and time. She appears well-developed and well-nourished.  HENT:  Head: Normocephalic and atraumatic.  Eyes: Conjunctivae and EOM are normal. Pupils are equal, round, and reactive to light.  Neck: Carotid bruit is not present.  Cardiovascular: Normal rate, regular rhythm, normal heart sounds and intact distal pulses.   Pulmonary/Chest: Effort normal and breath sounds normal.  Abdominal: Soft. She exhibits no pulsatile midline mass. There is no tenderness.  Musculoskeletal:       Left hand: She exhibits decreased range of motion, tenderness and bony tenderness. Normal sensation noted. Normal strength noted.       Hands: Neurological:  She is alert and oriented to person, place, and time. She has normal strength. No cranial nerve deficit or sensory deficit. She displays a negative Romberg sign. Coordination and gait normal.  Skin: Skin is warm and dry.  Psychiatric: She has a normal mood and affect. Her behavior is normal.  negative finkelsteins and tinels of L wrist. No scaphoid ttp.    [facility] reading (PRIMARY) by  Dr. Neva Seat: L hand - ? Cortical thickening of radial aspect thumb proximal phalynx, but no apparent fx.      Assessment & Plan:  Cynthia Forbes is a 62 y.o. female Thumb pain, left - Plan: DG Finger Thumb Left - no fx noted, but XR to overread.  Suspected overuse tendonitis of thumb. Short course of thumb spica splint. Holding on nsaids given elevated BP as below. Recheck next week if not improving. Has vicodin at home if needed.   HTN (hypertension) - uncontrolled, including by home readings. No signs of emergency in office, but ER/911 precautions reviewed as below. To call primary md in am for appt and keep track of outside Bp's   Patient Instructions  You can wear the brace for the next  4-5 days to rest the thumb then slowly restart use if improved.  Out of brace twice per day to move wrist and thumb gently.  If needed you can take one of you pain medicines at home, but avoid antiinflammatories as they can elevate your blood pressure. If pain not improving into next week - recheck office visit.  Keep a record of your blood pressures outside of the office and call your doctors office tomorrow to be seen this week if possible.  Let them know the readings you are having. If any chest pain, weakness, slurred speech, headaches or any new or worsening symptoms - call 911 or go to an emergency room.

## 2012-12-04 ENCOUNTER — Encounter: Payer: Self-pay | Admitting: Internal Medicine

## 2012-12-04 ENCOUNTER — Ambulatory Visit (INDEPENDENT_AMBULATORY_CARE_PROVIDER_SITE_OTHER): Payer: BC Managed Care – PPO | Admitting: Internal Medicine

## 2012-12-04 VITALS — BP 191/115 | HR 88 | Temp 98.8°F | Wt 220.0 lb

## 2012-12-04 DIAGNOSIS — E785 Hyperlipidemia, unspecified: Secondary | ICD-10-CM

## 2012-12-04 DIAGNOSIS — R7303 Prediabetes: Secondary | ICD-10-CM

## 2012-12-04 DIAGNOSIS — R7309 Other abnormal glucose: Secondary | ICD-10-CM

## 2012-12-04 DIAGNOSIS — I10 Essential (primary) hypertension: Secondary | ICD-10-CM

## 2012-12-04 DIAGNOSIS — J209 Acute bronchitis, unspecified: Secondary | ICD-10-CM

## 2012-12-04 MED ORDER — LOSARTAN POTASSIUM 100 MG PO TABS: 100.0000 mg | ORAL_TABLET | Freq: Every day | ORAL | Status: DC

## 2012-12-04 MED ORDER — BENZONATATE 200 MG PO CAPS: 200.0000 mg | ORAL_CAPSULE | Freq: Three times a day (TID) | ORAL | Status: DC | PRN

## 2012-12-04 MED ORDER — METOPROLOL TARTRATE 100 MG PO TABS: 100.0000 mg | ORAL_TABLET | Freq: Two times a day (BID) | ORAL | Status: DC

## 2012-12-04 MED ORDER — PREDNISONE 20 MG PO TABS: 20.0000 mg | ORAL_TABLET | Freq: Two times a day (BID) | ORAL | Status: DC

## 2012-12-04 MED ORDER — PRAVASTATIN SODIUM 40 MG PO TABS: ORAL_TABLET | ORAL | Status: DC

## 2012-12-04 MED ORDER — AZITHROMYCIN 250 MG PO TABS: ORAL_TABLET | ORAL | Status: DC

## 2012-12-04 NOTE — Patient Instructions (Addendum)
Carry room temperature water and sip liberally after coughing. Minimal Blood Pressure Goal= AVERAGE < 140/90;  Ideal is an AVERAGE < 135/85. This AVERAGE should be calculated from @ least 5-7 BP readings taken @ different times of day on different days of week. You should not respond to isolated BP readings , but rather the AVERAGE for that week .Please bring your  blood pressure cuff to office visits to verify that it is reliable.It  can also be checked against the blood pressure device at the pharmacy. Finger or wrist cuffs are not dependable; an arm cuff is.

## 2012-12-04 NOTE — Progress Notes (Signed)
  Subjective:    Patient ID: Cynthia Forbes, female    DOB: January 04, 1951, 62 y.o.   MRN: [id number]  HPI  Symptoms began approximately 2 weeks ago as a cough which can be paroxysmal and severe. Several coworkers have had similar issues with multiple work absences. The cough has been  associated with dyspnea and wheezing. Today she began to have some yellow sputum.  She has no definite history of asthma. She's never smoked. She is on ACE inhibitor.  Over-the-counter preparations including Robitussin, Mucinex, & Theraflu have been of no benefit   Review of Systems  She denies significant extrinsic symptoms of itchy, watery eyes, sneezing.  She also has had no fever or chills. She's had sweating with a paroxysmal cough  She denies symptoms of rhinosinusitis such as frontal headache, facial pain, nasal purulence, dental pain, otic pain, or otic discharge.  She denies significant reflux symptoms such as hoarseness, dyspepsia, or dysphagia.  She is overdue for followup of her hyperlipidemia and prediabetes. A1c has been as high as 6.4% previously ;she is not monitoring her glucoses     Objective:   Physical Exam General appearance:good health ;well nourished; no acute distress or increased work of breathing is present.  No  lymphadenopathy about the head, neck, or axilla noted.   Eyes: No conjunctival inflammation or lid edema is present.   Ears:  External ear exam shows no significant lesions or deformities.  Otoscopic examination reveals clear canals, tympanic membranes are intact bilaterally without bulging, retraction, inflammation or discharge.  Nose:  External nasal examination shows no deformity or inflammation. Nasal mucosa are pink and moist without lesions or exudates. No septal dislocation or deviation.No obstruction to airflow.   Oral exam: Dental hygiene is good; lips and gums are healthy appearing.There is no oropharyngeal erythema or exudate noted.   Neck:  No deformities,  masses, or tenderness noted.    Heart:  Normal rate and regular rhythm. S1 and S2 normal without gallop, murmur, click, rub or other extra sounds.   Lungs:Chest clear to auscultation; no wheezes, rhonchi,rales ,or rubs present.No increased work of breathing.  Hacking cough paroxysmally  Extremities:  No cyanosis, edema, or clubbing  noted    Skin: Warm & dry .         Assessment & Plan:  #1 bronchitis with paroxysmal cough  #2 ACE inhibitor therapy  #3 uncontrolled hypertension  #4 past history of prediabetes  #5 codeine allergy  Plan: See orders and recommendations

## 2013-01-11 ENCOUNTER — Other Ambulatory Visit (INDEPENDENT_AMBULATORY_CARE_PROVIDER_SITE_OTHER): Payer: BC Managed Care – PPO

## 2013-01-11 ENCOUNTER — Encounter: Payer: Self-pay | Admitting: Internal Medicine

## 2013-01-11 DIAGNOSIS — R7309 Other abnormal glucose: Secondary | ICD-10-CM

## 2013-01-11 DIAGNOSIS — E785 Hyperlipidemia, unspecified: Secondary | ICD-10-CM

## 2013-01-11 DIAGNOSIS — R7303 Prediabetes: Secondary | ICD-10-CM

## 2013-01-11 LAB — HEPATIC FUNCTION PANEL
Alkaline Phosphatase: 105 U/L (ref 39–117)
Bilirubin, Direct: 0 mg/dL (ref 0.0–0.3)
Total Bilirubin: 0.4 mg/dL (ref 0.3–1.2)

## 2013-01-11 LAB — LIPID PANEL
HDL: 47.3 mg/dL (ref >39.00)
VLDL: 24.4 mg/dL (ref 0.0–40.0)

## 2013-01-11 LAB — TSH: TSH: 0.86 u[IU]/mL (ref 0.35–5.50)

## 2013-01-11 LAB — HEMOGLOBIN A1C: Hgb A1c MFr Bld: 8 % — ABNORMAL HIGH (ref 4.6–6.5)

## 2013-01-15 ENCOUNTER — Encounter: Payer: Self-pay | Admitting: General Practice

## 2013-06-27 ENCOUNTER — Telehealth: Payer: Self-pay | Admitting: Internal Medicine

## 2013-06-27 ENCOUNTER — Other Ambulatory Visit: Payer: Self-pay | Admitting: Internal Medicine

## 2013-06-27 MED ORDER — METOPROLOL TARTRATE 100 MG PO TABS: 100.0000 mg | ORAL_TABLET | Freq: Two times a day (BID) | ORAL | Status: DC

## 2013-06-27 MED ORDER — ALBUTEROL SULFATE HFA 108 (90 BASE) MCG/ACT IN AERS: 2.0000 | INHALATION_SPRAY | Freq: Four times a day (QID) | RESPIRATORY_TRACT | Status: DC | PRN

## 2013-06-27 NOTE — Telephone Encounter (Signed)
I do not see Albuterol on her list

## 2013-06-27 NOTE — Telephone Encounter (Signed)
Patient called and requested a refill for metoprolol (LOPRESSOR) 100 MG tablet and her albuterol . Please advise

## 2013-06-27 NOTE — Telephone Encounter (Signed)
[facility] but additional refills require office visit to update medical history.

## 2013-07-16 ENCOUNTER — Ambulatory Visit (INDEPENDENT_AMBULATORY_CARE_PROVIDER_SITE_OTHER)
Admission: RE | Admit: 2013-07-16 | Discharge: 2013-07-16 | Disposition: A | Discharge status: Home / Self Care | Payer: BC Managed Care – PPO | Source: Ambulatory Visit | Attending: Internal Medicine | Admitting: Internal Medicine

## 2013-07-16 ENCOUNTER — Ambulatory Visit (INDEPENDENT_AMBULATORY_CARE_PROVIDER_SITE_OTHER): Payer: BC Managed Care – PPO | Admitting: Internal Medicine

## 2013-07-16 ENCOUNTER — Encounter: Payer: Self-pay | Admitting: Internal Medicine

## 2013-07-16 VITALS — BP 182/100 | HR 80 | Temp 100.9°F | Resp 16 | Wt 217.0 lb

## 2013-07-16 DIAGNOSIS — J069 Acute upper respiratory infection, unspecified: Secondary | ICD-10-CM

## 2013-07-16 DIAGNOSIS — R05 Cough: Secondary | ICD-10-CM

## 2013-07-16 DIAGNOSIS — R059 Cough, unspecified: Secondary | ICD-10-CM | POA: Insufficient documentation

## 2013-07-16 DIAGNOSIS — R7303 Prediabetes: Secondary | ICD-10-CM

## 2013-07-16 DIAGNOSIS — I10 Essential (primary) hypertension: Secondary | ICD-10-CM

## 2013-07-16 DIAGNOSIS — R7309 Other abnormal glucose: Secondary | ICD-10-CM

## 2013-07-16 MED ORDER — LEVOFLOXACIN 500 MG PO TABS: 500.0000 mg | ORAL_TABLET | Freq: Every day | ORAL | Status: DC

## 2013-07-16 MED ORDER — TELMISARTAN-HCTZ 80-25 MG PO TABS: 1.0000 | ORAL_TABLET | Freq: Every day | ORAL | Status: DC

## 2013-07-16 MED ORDER — HYDROCODONE-HOMATROPINE 5-1.5 MG/5ML PO SYRP: 5.0000 mL | ORAL_SOLUTION | Freq: Four times a day (QID) | ORAL | Status: DC | PRN

## 2013-07-16 NOTE — Assessment & Plan Note (Signed)
D/c Bnazepril Start Micardis

## 2013-07-16 NOTE — Assessment & Plan Note (Signed)
labs

## 2013-07-16 NOTE — Assessment & Plan Note (Signed)
CXR Levaquin 

## 2013-07-16 NOTE — Progress Notes (Signed)
Pre visit review using our clinic review tool, if applicable. No additional management support is needed unless otherwise documented below in the visit note. 

## 2013-07-17 ENCOUNTER — Telehealth: Payer: Self-pay | Admitting: Internal Medicine

## 2013-07-17 NOTE — Telephone Encounter (Signed)
Relevant patient education assigned to patient using Emmi. ° °

## 2013-07-18 ENCOUNTER — Telehealth: Payer: Self-pay | Admitting: *Deleted

## 2013-07-18 NOTE — Telephone Encounter (Signed)
How much is Losartan 100 mg Thx

## 2013-07-18 NOTE — Telephone Encounter (Signed)
Spoke with patient and she said the Micardis was over $300 with insurance. She will need something as a substitute. No list of substitute list was provided. She is currently using her benazapril. She also has a headache and asks what she can take for that. Her BP has been elevated and she is trying to get it back under control, and says it is coming back down with the benazepril. She isn't sure if the headache is coming from the BP or from being sick.  She wants to know if she can stay on the benazepril and and a fluid pill because she is tired of wasting $ on medications.

## 2013-07-20 NOTE — Telephone Encounter (Signed)
Pt is unsure if it Losartan is affordable. However, she is willing to try it since it is a generic. She wants to also keep a fluid pill. Please advise.

## 2013-07-21 MED ORDER — LOSARTAN POTASSIUM-HCTZ 100-25 MG PO TABS: 1.0000 | ORAL_TABLET | Freq: Every day | ORAL | Status: DC

## 2013-07-21 NOTE — Telephone Encounter (Signed)
ok 

## 2013-07-22 ENCOUNTER — Encounter (HOSPITAL_COMMUNITY): Payer: Self-pay | Admitting: Emergency Medicine

## 2013-07-22 ENCOUNTER — Emergency Department (HOSPITAL_COMMUNITY)
Admission: EM | Admit: 2013-07-22 | Discharge: 2013-07-22 | Disposition: A | Discharge status: Home / Self Care | Payer: BC Managed Care – PPO | Attending: Emergency Medicine | Admitting: Emergency Medicine

## 2013-07-22 DIAGNOSIS — E785 Hyperlipidemia, unspecified: Secondary | ICD-10-CM | POA: Insufficient documentation

## 2013-07-22 DIAGNOSIS — Z792 Long term (current) use of antibiotics: Secondary | ICD-10-CM | POA: Insufficient documentation

## 2013-07-22 DIAGNOSIS — R05 Cough: Secondary | ICD-10-CM

## 2013-07-22 DIAGNOSIS — Z79899 Other long term (current) drug therapy: Secondary | ICD-10-CM | POA: Insufficient documentation

## 2013-07-22 DIAGNOSIS — Z87442 Personal history of urinary calculi: Secondary | ICD-10-CM | POA: Insufficient documentation

## 2013-07-22 DIAGNOSIS — Z8709 Personal history of other diseases of the respiratory system: Secondary | ICD-10-CM | POA: Insufficient documentation

## 2013-07-22 DIAGNOSIS — I10 Essential (primary) hypertension: Secondary | ICD-10-CM | POA: Insufficient documentation

## 2013-07-22 DIAGNOSIS — E119 Type 2 diabetes mellitus without complications: Secondary | ICD-10-CM | POA: Insufficient documentation

## 2013-07-22 DIAGNOSIS — R059 Cough, unspecified: Secondary | ICD-10-CM | POA: Insufficient documentation

## 2013-07-22 LAB — BASIC METABOLIC PANEL
BUN: 7 mg/dL (ref 6–23)
CHLORIDE: 102 meq/L (ref 96–112)
CO2: 26 meq/L (ref 19–32)
CREATININE: 0.91 mg/dL (ref 0.50–1.10)
Calcium: 9.6 mg/dL (ref 8.4–10.5)
GFR calc Af Amer: 78 mL/min — ABNORMAL LOW (ref >90)
GFR calc non Af Amer: 67 mL/min — ABNORMAL LOW (ref >90)
GLUCOSE: 108 mg/dL — AB (ref 70–99)
Potassium: 3.7 mEq/L (ref 3.7–5.3)
Sodium: 143 mEq/L (ref 137–147)

## 2013-07-22 LAB — CBC WITH DIFFERENTIAL/PLATELET
BASOS ABS: 0 10*3/uL (ref 0.0–0.1)
Basophils Relative: 0 % (ref 0–1)
Eosinophils Absolute: 0.1 10*3/uL (ref 0.0–0.7)
Eosinophils Relative: 1 % (ref 0–5)
HEMATOCRIT: 42.7 % (ref 36.0–46.0)
HEMOGLOBIN: 13.8 g/dL (ref 12.0–15.0)
LYMPHS ABS: 2.8 10*3/uL (ref 0.7–4.0)
LYMPHS PCT: 44 % (ref 12–46)
MCH: 26.7 pg (ref 26.0–34.0)
MCHC: 32.3 g/dL (ref 30.0–36.0)
MCV: 82.6 fL (ref 78.0–100.0)
MONO ABS: 0.3 10*3/uL (ref 0.1–1.0)
Monocytes Relative: 5 % (ref 3–12)
NEUTROS ABS: 3.1 10*3/uL (ref 1.7–7.7)
Neutrophils Relative %: 50 % (ref 43–77)
Platelets: 405 10*3/uL — ABNORMAL HIGH (ref 150–400)
RBC: 5.17 MIL/uL — AB (ref 3.87–5.11)
RDW: 14.1 % (ref 11.5–15.5)
WBC: 6.3 10*3/uL (ref 4.0–10.5)

## 2013-07-22 MED ORDER — CLONIDINE HCL 0.1 MG PO TABS
0.1000 mg | ORAL_TABLET | ORAL | Status: DC | PRN
  Administered 2013-07-22: 0.1 mg via ORAL
  Administered 2013-07-22: 0.2 mg via ORAL
  Administered 2013-07-22: 0.1 mg via ORAL
  Filled 2013-07-22 (×4): qty 1

## 2013-07-22 MED ORDER — CLONIDINE HCL 0.1 MG PO TABS: 0.1000 mg | ORAL_TABLET | Freq: Two times a day (BID) | ORAL | Status: DC

## 2013-07-22 MED ORDER — CLONIDINE HCL 0.1 MG PO TABS
0.2000 mg | ORAL_TABLET | Freq: Once | ORAL | Status: AC
  Administered 2013-07-22: 0.2 mg via ORAL
  Filled 2013-07-22: qty 2

## 2013-07-22 NOTE — ED Notes (Signed)
Pt form home reports that she has been having difficulty keeping her BP down. Pt has BP med, but states that they are out of date. Pt adds that she has had bronchitis and has been taking abx. Pt has diminished R lower lung sounds. Pt is A&O and in NAD

## 2013-07-22 NOTE — Discharge Instructions (Signed)
DASH Diet  The DASH diet stands for "Dietary Approaches to Stop Hypertension." It is a healthy eating plan that has been shown to reduce high blood pressure (hypertension) in as little as 14 days, while also possibly providing other significant health benefits. These other health benefits include reducing the risk of breast cancer after menopause and reducing the risk of type 2 diabetes, heart disease, colon cancer, and stroke. Health benefits also include weight loss and slowing kidney failure in patients with chronic kidney disease.   DIET GUIDELINES  · Limit salt (sodium). Your diet should contain less than 1500 mg of sodium daily.  · Limit refined or processed carbohydrates. Your diet should include mostly whole grains. Desserts and added sugars should be used sparingly.  · Include small amounts of heart-healthy fats. These types of fats include nuts, oils, and tub margarine. Limit saturated and trans fats. These fats have been shown to be harmful in the body.  CHOOSING FOODS   The following food groups are based on a 2000 calorie diet. See your Registered Dietitian for individual calorie needs.  Grains and Grain Products (6 to 8 servings daily)  · Eat More Often: Whole-wheat bread, brown rice, whole-grain or wheat pasta, quinoa, popcorn without added fat or salt (air popped).  · Eat Less Often: White bread, white pasta, white rice, cornbread.  Vegetables (4 to 5 servings daily)  · Eat More Often: Fresh, frozen, and canned vegetables. Vegetables may be raw, steamed, roasted, or grilled with a minimal amount of fat.  · Eat Less Often/Avoid: Creamed or fried vegetables. Vegetables in a cheese sauce.  Fruit (4 to 5 servings daily)  · Eat More Often: All fresh, canned (in natural juice), or frozen fruits. Dried fruits without added sugar. One hundred percent fruit juice (½ cup [237 mL] daily).  · Eat Less Often: Dried fruits with added sugar. Canned fruit in light or heavy syrup.  Lean Meats, Fish, and Poultry (2  servings or less daily. One serving is 3 to 4 oz [85-114 g]).  · Eat More Often: Ninety percent or leaner ground beef, tenderloin, sirloin. Round cuts of beef, chicken breast, turkey breast. All fish. Grill, bake, or broil your meat. Nothing should be fried.  · Eat Less Often/Avoid: Fatty cuts of meat, turkey, or chicken leg, thigh, or wing. Fried cuts of meat or fish.  Dairy (2 to 3 servings)  · Eat More Often: Low-fat or fat-free milk, low-fat plain or light yogurt, reduced-fat or part-skim cheese.  · Eat Less Often/Avoid: Milk (whole, 2%). Whole milk yogurt. Full-fat cheeses.  Nuts, Seeds, and Legumes (4 to 5 servings per week)  · Eat More Often: All without added salt.  · Eat Less Often/Avoid: Salted nuts and seeds, canned beans with added salt.  Fats and Sweets (limited)  · Eat More Often: Vegetable oils, tub margarines without trans fats, sugar-free gelatin. Mayonnaise and salad dressings.  · Eat Less Often/Avoid: Coconut oils, palm oils, butter, stick margarine, cream, half and half, cookies, candy, pie.  FOR MORE INFORMATION  The Dash Diet Eating Plan: www.dashdiet.org  Document Released: 03/04/2011 Document Revised: 06/07/2011 Document Reviewed: 03/04/2011  Exit[employer]® Patient Information ©2014 [employer].

## 2013-07-22 NOTE — ED Provider Notes (Signed)
CSN: [id number]     Arrival date & time 07/22/13  0907 History   First MD Initiated Contact with Patient 07/22/13 0920     Chief Complaint  Patient presents with  . Hypertension  . Cough      HPI Pt form home reports that she has been having difficulty keeping her BP down. Pt has BP med, but states that they are out of date. Pt adds that she has had bronchitis and has been taking abx. Pt has diminished R lower lung sounds. Pt is A&O and in NAD  Past Medical History  Diagnosis Date  . Hypertension   . Bronchitis   . Kidney stones 2005 & 08/2011     passed spontaneously X 2  . Hyperlipemia   . Diabetes mellitus without complication    Past Surgical History  Procedure Laterality Date  . Tonsillectomy    . Abdominal hysterectomy  10/05/11    Dr Jennette KettleNeal  . Oophorectomy  10/05/11    degenerative fibroid; Dr Jennette KettleNeal  . Vaginal bleed  10/19/11    hematoma; Dr Marcelle OverlieHolland   No family history on file. History  Substance Use Topics  . Smoking status: Never Smoker   . Smokeless tobacco: Not on file  . Alcohol Use: No   OB History   Grav Para Term Preterm Abortions TAB SAB Ect Mult Living                 Review of Systems  All other systems reviewed and are negative.     Allergies  Codeine; Ivp dye; and Strawberry  Home Medications   Prior to Admission medications   Medication Sig Start Date End Date Taking? Authorizing Provider  albuterol (PROAIR HFA) 108 (90 BASE) MCG/ACT inhaler Inhale 2 puffs into the lungs every 6 (six) hours as needed for wheezing or shortness of breath. 06/27/13   Pecola LawlessWilliam F Hopper, MD  benzonatate (TESSALON) 200 MG capsule Take 1 capsule (200 mg total) by mouth 3 (three) times daily as needed for cough. 12/04/12   Pecola LawlessWilliam F Hopper, MD  HYDROcodone-homatropine [facility](HYCODAN) 5-1.5 MG/5ML syrup Take 5 mLs by mouth every 6 (six) hours as needed for cough. 07/16/13   Georgina QuintAleksei V Plotnikov, MD  levofloxacin (LEVAQUIN) 500 MG tablet Take 1 tablet (500 mg total) by mouth daily.  07/16/13   Georgina QuintAleksei V Plotnikov, MD  losartan-hydrochlorothiazide (HYZAAR) 100-25 MG per tablet Take 1 tablet by mouth daily. 07/21/13   Georgina QuintAleksei V Plotnikov, MD  metoprolol (LOPRESSOR) 100 MG tablet Take 1 tablet (100 mg total) by mouth 2 (two) times daily. 06/27/13   Pecola LawlessWilliam F Hopper, MD  pravastatin (PRAVACHOL) 40 MG tablet TAKE ONE TABLET BY MOUTH EVERYDAY AT BEDTIME. 12/04/12   Pecola LawlessWilliam F Hopper, MD   BP 113/67  Pulse 79  Temp(Src) 98.4 F (36.9 C) (Oral)  Resp 20  SpO2 97% Physical Exam  Nursing note and vitals reviewed. Constitutional: She is oriented to person, place, and time. She appears well-developed and well-nourished. No distress.  HENT:  Head: Normocephalic and atraumatic.  Eyes: Pupils are equal, round, and reactive to light.  Neck: Normal range of motion.  Cardiovascular: Normal rate and intact distal pulses.   Pulmonary/Chest: No respiratory distress.  Abdominal: Normal appearance. She exhibits no distension.  Musculoskeletal: Normal range of motion.  Neurological: She is alert and oriented to person, place, and time. No cranial nerve deficit.  Skin: Skin is warm and dry. No rash noted.  Psychiatric: She has a normal mood and affect.  Her behavior is normal.    ED Course  Procedures (including critical care time)  Medications  cloNIDine (CATAPRES) tablet 0.1 mg (0.1 mg Oral Given 07/22/13 1307)  cloNIDine (CATAPRES) tablet 0.2 mg (0.2 mg Oral Given 07/22/13 0951)    Labs Review Labs Reviewed  CBC WITH DIFFERENTIAL - Abnormal; Notable for the following:    RBC 5.17 (*)    Platelets 405 (*)    All other components within normal limits  BASIC METABOLIC PANEL - Abnormal; Notable for the following:    Glucose, Bld 108 (*)    GFR calc non Af Amer 67 (*)    GFR calc Af Amer 78 (*)    All other components within normal limits    Imaging Review No results found.   EKG Interpretation None      MDM   Final diagnoses:  Cough  Hypertension        Nelia Shiobert L  Blong Busk, MD 07/22/13 1428

## 2013-07-23 NOTE — Telephone Encounter (Signed)
LVM informing pt that rx is at the pharm.

## 2013-07-31 ENCOUNTER — Ambulatory Visit (INDEPENDENT_AMBULATORY_CARE_PROVIDER_SITE_OTHER): Payer: BC Managed Care – PPO | Admitting: Internal Medicine

## 2013-07-31 ENCOUNTER — Encounter: Payer: Self-pay | Admitting: Internal Medicine

## 2013-07-31 VITALS — BP 138/92 | HR 76 | Temp 98.4°F | Resp 13 | Wt 215.2 lb

## 2013-07-31 DIAGNOSIS — I10 Essential (primary) hypertension: Secondary | ICD-10-CM

## 2013-07-31 DIAGNOSIS — E785 Hyperlipidemia, unspecified: Secondary | ICD-10-CM

## 2013-07-31 DIAGNOSIS — IMO0001 Reserved for inherently not codable concepts without codable children: Secondary | ICD-10-CM

## 2013-07-31 DIAGNOSIS — E1165 Type 2 diabetes mellitus with hyperglycemia: Secondary | ICD-10-CM

## 2013-07-31 NOTE — Progress Notes (Signed)
Pre visit review using our clinic review tool, if applicable. No additional management support is needed unless otherwise documented below in the visit note. 

## 2013-07-31 NOTE — Patient Instructions (Signed)
Your next office appointment will be determined based upon review of your pending labs  Those instructions will be transmitted to you through My Chart  OR  by mail;whichever process is your choice to receive results & recommendations . Minimal Blood Pressure Goal= AVERAGE < 140/90;  Ideal is an AVERAGE < 135/85. This AVERAGE should be calculated from @ least 5-7 BP readings taken @ different times of day on different days of week. You should not respond to isolated BP readings , but rather the AVERAGE for that week .Please bring your  blood pressure cuff to office visits to verify that it is reliable.It  can also be checked against the blood pressure device at the pharmacy. Finger or wrist cuffs are not dependable; an arm cuff is. Cardiovascular exercise, this can be as simple a program as walking, is recommended 30-45 minutes 3-4 times per week. If you're not exercising you should take 6-8 weeks to build up to this level.

## 2013-07-31 NOTE — Assessment & Plan Note (Signed)
Lipids, LFTs, TSH  

## 2013-07-31 NOTE — Assessment & Plan Note (Signed)
Blood pressure goals reviewed. BMET 

## 2013-07-31 NOTE — Assessment & Plan Note (Signed)
Schedule A1c & urine microalbumin.

## 2013-07-31 NOTE — Progress Notes (Signed)
   Subjective:    Patient ID: Cynthia Forbes, female    DOB: [birth year], 63 y.o.   MRN: [id number]  HPI She was seen in emergency room 07/22/13 for acute bronchitis as well as hypertension. Her GFR was mildly reduced at 78; nonfasting glucose was 108. CBC and differential were essentially normal.  She believes she sustained infection while [place]  She was treated for bronchitis with narcotic cough syrup as well as Levaquin. She has had  rare dyspnea for which she is using albuterol. She is not exercising .  Her A1c was 8% 01/11/13. This would correlate with an average sugar 207 and increased long-term risk of 60%. Her A1c goal is less than 7%, ideally less than 6.5. She is not monitoring glucoses at home. Her home address verified as she states she never received results  Additionally at that time her LDL was 176.7. Her LDL goal would be less than 100, ideally less than 70.She has missed statin a "few times"   Review of Systems  Blood pressures range has been  from 111/69-179/135. She's had some lightheadedness; she has palpitations with stress. Work is major source of stress.  She denies epistaxis or significant headaches. Edema is not significant.  She denies polyuria, polyphasia, or polydipsia.  She has no burning in her feet. She also denies any nonhealing skin lesions.       Objective:   Physical Exam Appears healthy and well-nourished & in no acute distress  No carotid bruits are present.No neck pain distention present at 10 - 15 degrees. Thyroid normal to palpation  Heart rhythm and rate are normal with no gallop or murmur. S4  Chest is clear with no increased work of breathing  There is no evidence of aortic aneurysm or renal artery bruits  Abdomen soft with no organomegaly or masses. No HJR  No clubbing, cyanosis or edema present.  Pedal pulses are intact   No ischemic skin changes are present . Fingernails healthy   Alert and oriented.  Strength, tone, DTRs reflexes normal          Assessment & Plan:  See Current Assessment & Plan in Problem List under specific Diagnosis

## 2013-08-04 ENCOUNTER — Emergency Department (HOSPITAL_COMMUNITY)
Admission: EM | Admit: 2013-08-04 | Discharge: 2013-08-04 | Disposition: A | Discharge status: Home / Self Care | Payer: BC Managed Care – PPO | Attending: Emergency Medicine | Admitting: Emergency Medicine

## 2013-08-04 ENCOUNTER — Encounter (HOSPITAL_COMMUNITY): Payer: Self-pay | Admitting: Emergency Medicine

## 2013-08-04 DIAGNOSIS — R51 Headache: Secondary | ICD-10-CM | POA: Insufficient documentation

## 2013-08-04 DIAGNOSIS — Z87442 Personal history of urinary calculi: Secondary | ICD-10-CM | POA: Insufficient documentation

## 2013-08-04 DIAGNOSIS — Z79899 Other long term (current) drug therapy: Secondary | ICD-10-CM | POA: Insufficient documentation

## 2013-08-04 DIAGNOSIS — G4700 Insomnia, unspecified: Secondary | ICD-10-CM | POA: Insufficient documentation

## 2013-08-04 DIAGNOSIS — I10 Essential (primary) hypertension: Secondary | ICD-10-CM

## 2013-08-04 DIAGNOSIS — Z8709 Personal history of other diseases of the respiratory system: Secondary | ICD-10-CM | POA: Insufficient documentation

## 2013-08-04 DIAGNOSIS — F411 Generalized anxiety disorder: Secondary | ICD-10-CM | POA: Insufficient documentation

## 2013-08-04 DIAGNOSIS — Z792 Long term (current) use of antibiotics: Secondary | ICD-10-CM | POA: Insufficient documentation

## 2013-08-04 DIAGNOSIS — E785 Hyperlipidemia, unspecified: Secondary | ICD-10-CM | POA: Insufficient documentation

## 2013-08-04 DIAGNOSIS — Z7982 Long term (current) use of aspirin: Secondary | ICD-10-CM | POA: Insufficient documentation

## 2013-08-04 DIAGNOSIS — R42 Dizziness and giddiness: Secondary | ICD-10-CM | POA: Insufficient documentation

## 2013-08-04 MED ORDER — LOSARTAN POTASSIUM 50 MG PO TABS
100.0000 mg | ORAL_TABLET | Freq: Every day | ORAL | Status: DC
  Administered 2013-08-04: 100 mg via ORAL
  Filled 2013-08-04: qty 2

## 2013-08-04 MED ORDER — HYDROCHLOROTHIAZIDE 12.5 MG PO CAPS
25.0000 mg | ORAL_CAPSULE | Freq: Once | ORAL | Status: AC
  Administered 2013-08-04: 25 mg via ORAL
  Filled 2013-08-04: qty 2

## 2013-08-04 NOTE — Discharge Instructions (Signed)
Take your medications as follows: 1) Lopressor am and pm 2) Losartan/HCTZ in the AM--this is a change from your current medication schedule 3)  Clonidine)  One am, and one at 2pm--  This is a change from your current schedule.  Arterial Hypertension Arterial hypertension (high blood pressure) is a condition of elevated pressure in your blood vessels. Hypertension over a long period of time is a risk factor for strokes, heart attacks, and heart failure. It is also the leading cause of kidney (renal) failure.  CAUSES   In Adults -- Over 90% of all hypertension has no known cause. This is called essential or primary hypertension. In the other 10% of people with hypertension, the increase in blood pressure is caused by another disorder. This is called secondary hypertension. Important causes of secondary hypertension are:  Heavy alcohol use.  Obstructive sleep apnea.  Hyperaldosterosim (Conn's syndrome).  Steroid use.  Chronic kidney failure.  Hyperparathyroidism.  Medications.  Renal artery stenosis.  Pheochromocytoma.  Cushing's disease.  Coarctation of the aorta.  Scleroderma renal crisis.  Licorice (in excessive amounts).  Drugs (cocaine, methamphetamine). Your caregiver can explain any items above that apply to you.  In Children -- Secondary hypertension is more common and should always be considered.  Pregnancy -- Few women of childbearing age have high blood pressure. However, up to 10% of them develop hypertension of pregnancy. Generally, this will not harm the woman. It may be a sign of 3 complications of pregnancy: preeclampsia, HELLP syndrome, and eclampsia. Follow up and control with medication is necessary. SYMPTOMS   This condition normally does not produce any noticeable symptoms. It is usually found during a routine exam.  Malignant hypertension is a late problem of high blood pressure. It may have the following symptoms:  Headaches.  Blurred  vision.  End-organ damage (this means your kidneys, heart, lungs, and other organs are being damaged).  Stressful situations can increase the blood pressure. If a person with normal blood pressure has their blood pressure go up while being seen by their caregiver, this is often termed "white coat hypertension." Its importance is not known. It may be related with eventually developing hypertension or complications of hypertension.  Hypertension is often confused with mental tension, stress, and anxiety. DIAGNOSIS  The diagnosis is made by 3 separate blood pressure measurements. They are taken at least 1 week apart from each other. If there is organ damage from hypertension, the diagnosis may be made without repeat measurements. Hypertension is usually identified by having blood pressure readings:  Above 140/90 mmHg measured in both arms, at 3 separate times, over a couple weeks.  Over 130/80 mmHg should be considered a risk factor and may require treatment in patients with diabetes. Blood pressure readings over 120/80 mmHg are called "pre-hypertension" even in non-diabetic patients. To get a true blood pressure measurement, use the following guidelines. Be aware of the factors that can alter blood pressure readings.  Take measurements at least 1 hour after caffeine.  Take measurements 30 minutes after smoking and without any stress. This is another reason to quit smoking  it raises your blood pressure.  Use a proper cuff size. Ask your caregiver if you are not sure about your cuff size.  Most home blood pressure cuffs are automatic. They will measure systolic and diastolic pressures. The systolic pressure is the pressure reading at the start of sounds. Diastolic pressure is the pressure at which the sounds disappear. If you are elderly, measure pressures in multiple postures.  Try sitting, lying or standing.  Sit at rest for a minimum of 5 minutes before taking measurements.  You should not  be on any medications like decongestants. These are found in many cold medications.  Record your blood pressure readings and review them with your caregiver. If you have hypertension:  Your caregiver may do tests to be sure you do not have secondary hypertension (see "causes" above).  Your caregiver may also look for signs of metabolic syndrome. This is also called Syndrome X or Insulin Resistance Syndrome. You may have this syndrome if you have type 2 diabetes, abdominal obesity, and abnormal blood lipids in addition to hypertension.  Your caregiver will take your medical and family history and perform a physical exam.  Diagnostic tests may include blood tests (for glucose, cholesterol, potassium, and kidney function), a urinalysis, or an EKG. Other tests may also be necessary depending on your condition. PREVENTION  There are important lifestyle issues that you can adopt to reduce your chance of developing hypertension:  Maintain a normal weight.  Limit the amount of salt (sodium) in your diet.  Exercise often.  Limit alcohol intake.  Get enough potassium in your diet. Discuss specific advice with your caregiver.  Follow a DASH diet (dietary approaches to stop hypertension). This diet is rich in fruits, vegetables, and low-fat dairy products, and avoids certain fats. PROGNOSIS  Essential hypertension cannot be cured. Lifestyle changes and medical treatment can lower blood pressure and reduce complications. The prognosis of secondary hypertension depends on the underlying cause. Many people whose hypertension is controlled with medicine or lifestyle changes can live a normal, healthy life.  RISKS AND COMPLICATIONS  While high blood pressure alone is not an illness, it often requires treatment due to its short- and long-term effects on many organs. Hypertension increases your risk for:  CVAs or strokes (cerebrovascular accident).  Heart failure due to chronically high blood pressure  (hypertensive cardiomyopathy).  Heart attack (myocardial infarction).  Damage to the retina (hypertensive retinopathy).  Kidney failure (hypertensive nephropathy). Your caregiver can explain list items above that apply to you. Treatment of hypertension can significantly reduce the risk of complications. TREATMENT   For overweight patients, weight loss and regular exercise are recommended. Physical fitness lowers blood pressure.  Mild hypertension is usually treated with diet and exercise. A diet rich in fruits and vegetables, fat-free dairy products, and foods low in fat and salt (sodium) can help lower blood pressure. Decreasing salt intake decreases blood pressure in a 1/3 of people.  Stop smoking if you are a smoker. The steps above are highly effective in reducing blood pressure. While these actions are easy to suggest, they are difficult to achieve. Most patients with moderate or severe hypertension end up requiring medications to bring their blood pressure down to a normal level. There are several classes of medications for treatment. Blood pressure pills (antihypertensives) will lower blood pressure by their different actions. Lowering the blood pressure by 10 mmHg may decrease the risk of complications by as much as 25%. The goal of treatment is effective blood pressure control. This will reduce your risk for complications. Your caregiver will help you determine the best treatment for you according to your lifestyle. What is excellent treatment for one person, may not be for you. HOME CARE INSTRUCTIONS   Do not smoke.  Follow the lifestyle changes outlined in the "Prevention" section.  If you are on medications, follow the directions carefully. Blood pressure medications must be taken as prescribed. Skipping  doses reduces their benefit. It also puts you at risk for problems.  Follow up with your caregiver, as directed.  If you are asked to monitor your blood pressure at home,  follow the guidelines in the "Diagnosis" section above. SEEK MEDICAL CARE IF:   You think you are having medication side effects.  You have recurrent headaches or lightheadedness.  You have swelling in your ankles.  You have trouble with your vision. SEEK IMMEDIATE MEDICAL CARE IF:   You have sudden onset of chest pain or pressure, difficulty breathing, or other symptoms of a heart attack.  You have a severe headache.  You have symptoms of a stroke (such as sudden weakness, difficulty speaking, difficulty walking). MAKE SURE YOU:   Understand these instructions.  Will watch your condition.  Will get help right away if you are not doing well or get worse. Document Released: 03/15/2005 Document Revised: 06/07/2011 Document Reviewed: 10/13/2006 [facility] Patient Information 2014 [employer] [place].

## 2013-08-04 NOTE — ED Notes (Signed)
Patient is alert and oriented x3.  She is complaining of hypertension symptoms.  She is currently having lightheadedness  With dizziness and dry mouth.  Currently she denies any pain.  She states that this has been an ongoing issue for the  Last few weeks.

## 2013-08-05 NOTE — ED Provider Notes (Signed)
CSN: [id number]     Arrival date & time 08/04/13  1957 History   First MD Initiated Contact with Patient 08/04/13 2008     Chief Complaint  Patient presents with  . Hypertension      HPI  Patient presents with a complaint of hypertension. She states she is usually where when her blood pressure is high because she gets headache. She's had multiple medication changes in the last month. Seen her primary care physician. Has been seen in the emergency room.  Current meds medicine regimen for only about one week. She is taking twice a day Lopressor, twice a day clonidine, and every morning losartan and hydrochlorothiazide.  She states she has insomnia she can use her medicines. She has a headache and is dizzy.  Past Medical History  Diagnosis Date  . Hypertension   . Bronchitis   . Kidney stones 2005 & 08/2011     passed spontaneously X 2  . Hyperlipemia   . Diabetes mellitus without complication    Past Surgical History  Procedure Laterality Date  . Tonsillectomy    . Abdominal hysterectomy  10/05/11    Dr Jennette KettleNeal  . Oophorectomy  10/05/11    degenerative fibroid; Dr Jennette KettleNeal  . Vaginal bleed  10/19/11    hematoma; Dr Marcelle OverlieHolland   History reviewed. No pertinent family history. History  Substance Use Topics  . Smoking status: Never Smoker   . Smokeless tobacco: Not on file  . Alcohol Use: No   OB History   Grav Para Term Preterm Abortions TAB SAB Ect Mult Living                 Review of Systems  Constitutional: Negative for fever, chills, diaphoresis, appetite change and fatigue.       Insomnia  HENT: Negative for mouth sores, sore throat and trouble swallowing.   Eyes: Negative for visual disturbance.  Respiratory: Negative for cough, chest tightness, shortness of breath and wheezing.   Cardiovascular: Negative for chest pain.  Gastrointestinal: Negative for nausea, vomiting, abdominal pain, diarrhea and abdominal distention.  Endocrine: Negative for polydipsia, polyphagia and  polyuria.  Genitourinary: Negative for dysuria, frequency and hematuria.  Musculoskeletal: Negative for gait problem.  Skin: Negative for color change, pallor and rash.  Neurological: Positive for dizziness and headaches. Negative for syncope and light-headedness.  Hematological: Does not bruise/bleed easily.  Psychiatric/Behavioral: Negative for behavioral problems and confusion. The patient is nervous/anxious.       Allergies  Codeine; Ivp dye; and Strawberry  Home Medications   Prior to Admission medications   Medication Sig Start Date End Date Taking? Authorizing Provider  acetaminophen (TYLENOL) 500 MG tablet Take 1,000 mg by mouth every 6 (six) hours as needed for headache.   Yes Historical Provider, MD  albuterol (PROAIR HFA) 108 (90 BASE) MCG/ACT inhaler Inhale 2 puffs into the lungs every 6 (six) hours as needed for wheezing or shortness of breath. 06/27/13  Yes Pecola LawlessWilliam F Hopper, MD  aspirin 81 MG chewable tablet Chew 81 mg by mouth daily.   Yes Historical Provider, MD  cloNIDine (CATAPRES) 0.1 MG tablet Take 1 tablet (0.1 mg total) by mouth 2 (two) times daily. 07/22/13  Yes Nelia Shiobert L Beaton, MD  HYDROcodone-homatropine [facility](HYCODAN) 5-1.5 MG/5ML syrup Take 5 mLs by mouth every 6 (six) hours as needed for cough. 07/16/13  Yes Georgina QuintAleksei V Plotnikov, MD  levofloxacin (LEVAQUIN) 500 MG tablet Take 500 mg by mouth daily. 10 day therapy course patient is on day  5 of therapy 07/16/13  Yes Tresa GarterAleksei V Plotnikov, MD  losartan-hydrochlorothiazide (HYZAAR) 100-25 MG per tablet Take 1 tablet by mouth daily.   Yes Historical Provider, MD  metoprolol (LOPRESSOR) 100 MG tablet Take 100 mg by mouth 2 (two) times daily.   Yes Historical Provider, MD  Nutritional Supplements (JUICE PLUS FIBRE PO) Take 2 tablets by mouth 2 (two) times daily.   Yes Historical Provider, MD  pravastatin (PRAVACHOL) 40 MG tablet Take 40 mg by mouth daily.   Yes Historical Provider, MD   BP 128/92  Pulse 62  Temp(Src) 98.1 F  (36.7 C) (Oral)  Resp 16  SpO2 98% Physical Exam  Constitutional: She is oriented to person, place, and time. She appears well-developed and well-nourished. No distress.  HENT:  Head: Normocephalic.  Eyes: Conjunctivae are normal. Pupils are equal, round, and reactive to light. No scleral icterus.  Neck: Normal range of motion. Neck supple. No thyromegaly present.  Cardiovascular: Normal rate and regular rhythm.  Exam reveals no gallop and no friction rub.   No murmur heard. Pulmonary/Chest: Effort normal and breath sounds normal. No respiratory distress. She has no wheezes. She has no rales.  Abdominal: Soft. Bowel sounds are normal. She exhibits no distension. There is no tenderness. There is no rebound.  Musculoskeletal: Normal range of motion.  Neurological: She is alert and oriented to person, place, and time.  Skin: Skin is warm and dry. No rash noted.  Psychiatric: She has a normal mood and affect. Her behavior is normal.   she's awake alert. She has no edema. She is normal cardiac exam. No clinical signs of failure. No vision changes. Normal visual acuity. Normal retinal exam.   ED Course  Procedures (including critical care time) Labs Review Labs Reviewed - No data to display  Imaging Review No results found.   EKG Interpretation None      MDM   Final diagnoses:  Hypertension    Discussion she's not taken losartan and she was simply given her losartan her blood pressure improves at discharge is 147/98. Chemistry take her clonidine earlier in the day which may help her insomnia. Astra take her losartan the morning which may keep her from a hypertensive afternoons. Refer her back her primary care physician. I have asked her to be diligent patient while she continues her medication regimen.    Rolland PorterMark Shiana Rappleye, MD 08/05/13 [phone]

## 2013-08-07 ENCOUNTER — Ambulatory Visit (INDEPENDENT_AMBULATORY_CARE_PROVIDER_SITE_OTHER): Payer: BC Managed Care – PPO | Admitting: Internal Medicine

## 2013-08-07 ENCOUNTER — Encounter: Payer: Self-pay | Admitting: Internal Medicine

## 2013-08-07 VITALS — BP 130/92 | HR 76 | Temp 98.5°F | Wt 211.4 lb

## 2013-08-07 DIAGNOSIS — I10 Essential (primary) hypertension: Secondary | ICD-10-CM

## 2013-08-07 MED ORDER — HYDRALAZINE HCL 25 MG PO TABS: 25.0000 mg | ORAL_TABLET | Freq: Three times a day (TID) | ORAL | Status: DC

## 2013-08-07 NOTE — Progress Notes (Signed)
Pre visit review using our clinic review tool, if applicable. No additional management support is needed unless otherwise documented below in the visit note. 

## 2013-08-07 NOTE — Progress Notes (Signed)
   Subjective:    Patient ID: Cynthia Forbes, female    DOB: [birth year], 63 y.o.   MRN: [id number]  HPI  She is here in followup from the 08/05/13 ER visit for elevated blood pressure.  Since the or visit her blood pressures have ranged 98/68-156/110   Review of Systems Patient denies Headaches & epistaxis  Chest pain, palpitations, tachycardia, exertional dyspnea, paroxysmal nocturnal dyspnea, claudication or edema are absent.   Patient is complaining of ringing in the ears since  08/05/13  that has been bothersome.  Also, patient is having trouble falling asleep  and maintaining sleep.  She attributes this to clonidine.  It has also caused dry eyes and dry mouth       Objective:   Physical Exam  Appears healthy and well-nourished & in no acute distress  Wax is noted in the left ear. Hearing is intact to whisper bilaterally. Tuning fork exam is normal  No carotid bruits are present.No neck pain distention present at 10 - 15 degrees. Thyroid normal to palpation  Heart rhythm and rate are normal with no gallop or murmur  Chest is clear with no increased work of breathing  There is no evidence of aortic aneurysm or renal artery bruits  Abdomen soft with no organomegaly or masses. No HJR  No clubbing, cyanosis or edema present.  Pedal pulses are intact   No ischemic skin changes are present . Fingernails/ toenails healthy   Alert and oriented. Strength, tone, DTRs reflexes normal  Romberg testing and finger to nose testing is normal. Gait is normal          Assessment & Plan:  #1 hypertension, poorly controlled  #2 intolerance to clonidine with insomnia, dry mouth, dry eyes  #3 tinnitus; if this fails to resolve off the clonidine; ENT referral will be pursued  See orders

## 2013-08-07 NOTE — Patient Instructions (Signed)
To prevent sleep dysfunction follow these instructions for sleep hygiene. Do not read, watch TV, or eat in bed. Do not get into bed until you are ready to turn off the light &  to go to sleep. Do not ingest stimulants ( decongestants, diet pills, nicotine, caffeine) after the evening meal.Do not take daytime naps.Cardiovascular exercise, this can be as simple a program as walking, is recommended 30-45 minutes 3-4 times per week. If you're not exercising you should take 6-8 weeks to build up to this level.   Minimal Blood Pressure Goal= AVERAGE < 140/90;  Ideal is an AVERAGE < 135/85. This AVERAGE should be calculated from @ least 5-7 BP readings taken @ different times of day on different days of week. You should not respond to isolated BP readings , but rather the AVERAGE for that week .Please bring your  blood pressure cuff to office visits to verify that it is reliable.It  can also be checked against the blood pressure device at the pharmacy. Finger or wrist cuffs are not dependable; an arm cuff is. 

## 2013-08-08 ENCOUNTER — Telehealth: Payer: Self-pay

## 2013-08-08 NOTE — Telephone Encounter (Signed)
Relevant patient education assigned to patient using Emmi. ° °

## 2013-08-20 ENCOUNTER — Emergency Department (HOSPITAL_COMMUNITY)
Admission: EM | Admit: 2013-08-20 | Discharge: 2013-08-20 | Disposition: A | Discharge status: Home / Self Care | Payer: BC Managed Care – PPO | Attending: Emergency Medicine | Admitting: Emergency Medicine

## 2013-08-20 ENCOUNTER — Encounter (HOSPITAL_COMMUNITY): Payer: Self-pay | Admitting: Emergency Medicine

## 2013-08-20 DIAGNOSIS — E785 Hyperlipidemia, unspecified: Secondary | ICD-10-CM | POA: Insufficient documentation

## 2013-08-20 DIAGNOSIS — Z8709 Personal history of other diseases of the respiratory system: Secondary | ICD-10-CM | POA: Insufficient documentation

## 2013-08-20 DIAGNOSIS — Z87442 Personal history of urinary calculi: Secondary | ICD-10-CM | POA: Insufficient documentation

## 2013-08-20 DIAGNOSIS — Z79899 Other long term (current) drug therapy: Secondary | ICD-10-CM | POA: Insufficient documentation

## 2013-08-20 DIAGNOSIS — R42 Dizziness and giddiness: Secondary | ICD-10-CM | POA: Insufficient documentation

## 2013-08-20 DIAGNOSIS — H9313 Tinnitus, bilateral: Secondary | ICD-10-CM

## 2013-08-20 DIAGNOSIS — Z7982 Long term (current) use of aspirin: Secondary | ICD-10-CM | POA: Insufficient documentation

## 2013-08-20 DIAGNOSIS — Z792 Long term (current) use of antibiotics: Secondary | ICD-10-CM | POA: Insufficient documentation

## 2013-08-20 DIAGNOSIS — E119 Type 2 diabetes mellitus without complications: Secondary | ICD-10-CM | POA: Insufficient documentation

## 2013-08-20 DIAGNOSIS — I10 Essential (primary) hypertension: Secondary | ICD-10-CM

## 2013-08-20 DIAGNOSIS — H9319 Tinnitus, unspecified ear: Secondary | ICD-10-CM | POA: Insufficient documentation

## 2013-08-20 LAB — CBC WITH DIFFERENTIAL/PLATELET
BASOS ABS: 0.1 10*3/uL (ref 0.0–0.1)
Basophils Relative: 1 % (ref 0–1)
EOS ABS: 0.1 10*3/uL (ref 0.0–0.7)
Eosinophils Relative: 1 % (ref 0–5)
HCT: 40.9 % (ref 36.0–46.0)
Hemoglobin: 12.9 g/dL (ref 12.0–15.0)
LYMPHS PCT: 36 % (ref 12–46)
Lymphs Abs: 2.4 10*3/uL (ref 0.7–4.0)
MCH: 26.4 pg (ref 26.0–34.0)
MCHC: 31.5 g/dL (ref 30.0–36.0)
MCV: 83.8 fL (ref 78.0–100.0)
Monocytes Absolute: 0.4 10*3/uL (ref 0.1–1.0)
Monocytes Relative: 6 % (ref 3–12)
NEUTROS PCT: 56 % (ref 43–77)
Neutro Abs: 3.8 10*3/uL (ref 1.7–7.7)
PLATELETS: 357 10*3/uL (ref 150–400)
RBC: 4.88 MIL/uL (ref 3.87–5.11)
RDW: 15 % (ref 11.5–15.5)
WBC: 6.8 10*3/uL (ref 4.0–10.5)

## 2013-08-20 LAB — URINALYSIS, ROUTINE W REFLEX MICROSCOPIC
Bilirubin Urine: NEGATIVE
Glucose, UA: NEGATIVE mg/dL
Hgb urine dipstick: NEGATIVE
Ketones, ur: NEGATIVE mg/dL
LEUKOCYTES UA: NEGATIVE
NITRITE: NEGATIVE
PH: 6.5 (ref 5.0–8.0)
Protein, ur: NEGATIVE mg/dL
SPECIFIC GRAVITY, URINE: 1.017 (ref 1.005–1.030)
UROBILINOGEN UA: 0.2 mg/dL (ref 0.0–1.0)

## 2013-08-20 LAB — BASIC METABOLIC PANEL
BUN: 13 mg/dL (ref 6–23)
CALCIUM: 9.9 mg/dL (ref 8.4–10.5)
CHLORIDE: 98 meq/L (ref 96–112)
CO2: 25 meq/L (ref 19–32)
Creatinine, Ser: 0.96 mg/dL (ref 0.50–1.10)
GFR calc non Af Amer: 63 mL/min — ABNORMAL LOW (ref >90)
GFR, EST AFRICAN AMERICAN: 73 mL/min — AB (ref >90)
Glucose, Bld: 108 mg/dL — ABNORMAL HIGH (ref 70–99)
Potassium: 3.7 mEq/L (ref 3.7–5.3)
SODIUM: 136 meq/L — AB (ref 137–147)

## 2013-08-20 LAB — TROPONIN I

## 2013-08-20 MED ORDER — MECLIZINE HCL 25 MG PO TABS
25.0000 mg | ORAL_TABLET | Freq: Once | ORAL | Status: AC
  Administered 2013-08-20: 25 mg via ORAL
  Filled 2013-08-20: qty 1

## 2013-08-20 MED ORDER — MECLIZINE HCL 25 MG PO TABS: 25.0000 mg | ORAL_TABLET | Freq: Three times a day (TID) | ORAL | Status: DC | PRN

## 2013-08-20 NOTE — ED Notes (Signed)
Patient ambulated in hall normally.  She did feel dizzy, but no gait disturbances.

## 2013-08-20 NOTE — ED Notes (Signed)
Pt states she was sleeping when she noted loud ringing in ears. Pt checked BP several times, not BP to be high at home. Denies HA, pt states she did think L arm was numb for short time. Pt appears very anxious at this time.

## 2013-08-20 NOTE — Discharge Instructions (Signed)
Read the information below.  Use the prescribed medication as directed.  Please discuss all new medications with your pharmacist.  You may return to the Emergency Department at any time for worsening condition or any new symptoms that concern you.   If you develop chest pain, shortness of breath, fever, you pass out, or become weak or dizzy, return to the ER for a recheck.      Dizziness Dizziness is a common problem. It is a feeling of unsteadiness or lightheadedness. You may feel like you are about to faint. Dizziness can lead to injury if you stumble or fall. A person of any age group can suffer from dizziness, but dizziness is more common in older adults. CAUSES  Dizziness can be caused by many different things, including:  Middle ear problems.  Standing for too long.  Infections.  An allergic reaction.  Aging.  An emotional response to something, such as the sight of blood.  Side effects of medicines.  Fatigue.  Problems with circulation or blood pressure.  Excess use of alcohol, medicines, or illegal drug use.  Breathing too fast (hyperventilation).  An arrhythmia or problems with your heart rhythm.  Low red blood cell count (anemia).  Pregnancy.  Vomiting, diarrhea, fever, or other illnesses that cause dehydration.  Diseases or conditions such as Parkinson's disease, high blood pressure (hypertension), diabetes, and thyroid problems.  Exposure to extreme heat. DIAGNOSIS  To find the cause of your dizziness, your caregiver may do a physical exam, lab tests, radiologic imaging scans, or an electrocardiography test (ECG).  TREATMENT  Treatment of dizziness depends on the cause of your symptoms and can vary greatly. HOME CARE INSTRUCTIONS   Drink enough fluids to keep your urine clear or pale yellow. This is especially important in very hot weather. In the elderly, it is also important in cold weather.  If your dizziness is caused by medicines, take them exactly as  directed. When taking blood pressure medicines, it is especially important to get up slowly.  Rise slowly from chairs and steady yourself until you feel okay.  In the morning, first sit up on the side of the bed. When this seems okay, stand slowly while holding onto something until you know your balance is fine.  If you need to stand in one place for a long time, be sure to move your legs often. Tighten and relax the muscles in your legs while standing.  If dizziness continues to be a problem, have someone stay with you for a day or two. Do this until you feel you are well enough to stay alone. Have the person call your caregiver if he or she notices changes in you that are concerning.  Do not drive or use heavy machinery if you feel dizzy.  Do not drink alcohol. SEEK IMMEDIATE MEDICAL CARE IF:   Your dizziness or lightheadedness gets worse.  You feel nauseous or vomit.  You develop problems with talking, walking, weakness, or using your arms, hands, or legs.  You are not thinking clearly or you have difficulty forming sentences. It may take a friend or family member to determine if your thinking is normal.  You develop chest pain, abdominal pain, shortness of breath, or sweating.  Your vision changes.  You notice any bleeding.  You have side effects from medicine that seems to be getting worse rather than better. MAKE SURE YOU:   Understand these instructions.  Will watch your condition.  Will get help right away if you  are not doing well or get worse. Document Released: 09/08/2000 Document Revised: 06/07/2011 Document Reviewed: 10/02/2010 [facility] Patient Information 2014 [place], [place].  Tinnitus Sounds you hear in your ears and coming from within the ear is called tinnitus. This can be a symptom of many ear disorders. It is often associated with hearing loss.  Tinnitus can be seen with:  Infections.  Ear blockages such as wax buildup.  Meniere's disease.  Ear  damage.  Inherited.  Occupational causes. While irritating, it is not usually a threat to health. When the cause of the tinnitus is wax, infection in the middle ear, or foreign body it is easily treated. Hearing loss will usually be reversible.  TREATMENT  When treating the underlying cause does not get rid of tinnitus, it may be necessary to get rid of the unwanted sound by covering it up with more pleasant background noises. This may include music, the radio etc. There are tinnitus maskers which can be worn which produce background noise to cover up the tinnitus. Avoid all medications which tend to make tinnitus worse such as alcohol, caffeine, aspirin, and nicotine. There are many soothing background tapes such as rain, ocean, thunderstorms, etc. These soothing sounds help with sleeping or resting. Keep all follow-up appointments and referrals. This is important to identify the cause of the problem. It also helps avoid complications, impaired hearing, disability, or chronic pain. Document Released: 03/15/2005 Document Revised: 06/07/2011 Document Reviewed: 11/01/2007 [facility] Patient Information 2014 [place], [place].  Hypertension As your heart beats, it forces blood through your arteries. This force is your blood pressure. If the pressure is too high, it is called hypertension (HTN) or high blood pressure. HTN is dangerous because you may have it and not know it. High blood pressure may mean that your heart has to work harder to pump blood. Your arteries may be narrow or stiff. The extra work puts you at risk for heart disease, stroke, and other problems.  Blood pressure consists of two numbers, a higher number over a lower, 110/72, for example. It is stated as "110 over 72." The ideal is below 120 for the top number (systolic) and under 80 for the bottom (diastolic). Write down your blood pressure today. You should pay close attention to your blood pressure if you have certain conditions such  as:  Heart failure.  Prior heart attack.  Diabetes  Chronic kidney disease.  Prior stroke.  Multiple risk factors for heart disease. To see if you have HTN, your blood pressure should be measured while you are seated with your arm held at the level of the heart. It should be measured at least twice. A one-time elevated blood pressure reading (especially in the Emergency Department) does not mean that you need treatment. There may be conditions in which the blood pressure is different between your right and left arms. It is important to see your caregiver soon for a recheck. Most people have essential hypertension which means that there is not a specific cause. This type of high blood pressure may be lowered by changing lifestyle factors such as:  Stress.  Smoking.  Lack of exercise.  Excessive weight.  Drug/tobacco/alcohol use.  Eating less salt. Most people do not have symptoms from high blood pressure until it has caused damage to the body. Effective treatment can often prevent, delay or reduce that damage. TREATMENT  When a cause has been identified, treatment for high blood pressure is directed at the cause. There are a large number of medications  to treat HTN. These fall into several categories, and your caregiver will help you select the medicines that are best for you. Medications may have side effects. You should review side effects with your caregiver. If your blood pressure stays high after you have made lifestyle changes or started on medicines,   Your medication(s) may need to be changed.  Other problems may need to be addressed.  Be certain you understand your prescriptions, and know how and when to take your medicine.  Be sure to follow up with your caregiver within the time frame advised (usually within two weeks) to have your blood pressure rechecked and to review your medications.  If you are taking more than one medicine to lower your blood pressure, make  sure you know how and at what times they should be taken. Taking two medicines at the same time can result in blood pressure that is too low. SEEK IMMEDIATE MEDICAL CARE IF:  You develop a severe headache, blurred or changing vision, or confusion.  You have unusual weakness or numbness, or a faint feeling.  You have severe chest or abdominal pain, vomiting, or breathing problems. MAKE SURE YOU:   Understand these instructions.  Will watch your condition.  Will get help right away if you are not doing well or get worse. Document Released: 03/15/2005 Document Revised: 06/07/2011 Document Reviewed: 11/03/2007 [facility] Patient Information 2014 [place], [place].

## 2013-08-20 NOTE — ED Provider Notes (Signed)
CSN: [id number]     Arrival date & time 08/20/13  4098 History   First MD Initiated Contact with Patient 08/20/13 [phone]     Chief Complaint  Patient presents with  . Hypertension     (Consider location/radiation/quality/duration/timing/severity/associated sxs/prior Treatment) The history is provided by the patient and medical records.    Patient presents with concern about her blood pressure.  Pt has been seen in ED and by PCP several times in the past few months with changes to her HTN medications.  Has had ongoing tinnitus, lightheadedness, and irregular BPs for at least 5 weeks.  States this morning around 3am she was awoken by the ringing in her ears, which typically happens around the same time every night.  Has had ringing in bilateral ears for months.  Her BP was 176/108 when she woke up.  Her HTN caused a "diarrhea attack." She took a hydralazine but found that her pressure did not immediately improve, she was lightheaded, mildly SOB, and shaking so she came to the ED.  States her breathing is "just different," not labored breathing or even difficulty breathing.  Lightheadedness has been ongoing whenever her pressure is elevated, is worse with lying flat or sitting straight up.  States she has not been able to lie flat for months.  Also having dry mouth, she thinks from the medications.  Denies chest pain, abdominal pain, dizziness, focal neurologic deficits.  Denies leg swelling.     She takes metoprolol 100mg  BID, Losartan/Hctz once daily, Hydralazine TID for blood pressure.  She was taken off clonidine by PCP because of side effects (insomnia).    Past Medical History  Diagnosis Date  . Hypertension   . Bronchitis   . Kidney stones 2005 & 08/2011     passed spontaneously X 2  . Hyperlipemia   . Diabetes mellitus without complication    Past Surgical History  Procedure Laterality Date  . Tonsillectomy    . Abdominal hysterectomy  10/05/11    Dr Jennette Kettle  . Oophorectomy  10/05/11   degenerative fibroid; Dr Jennette Kettle  . Vaginal bleed  10/19/11    hematoma; Dr Marcelle Overlie   No family history on file. History  Substance Use Topics  . Smoking status: Never Smoker   . Smokeless tobacco: Not on file  . Alcohol Use: No   OB History   Grav Para Term Preterm Abortions TAB SAB Ect Mult Living                 Review of Systems  All other systems reviewed and are negative.     Allergies  Codeine; Ivp dye; Clonidine derivatives; and Strawberry  Home Medications   Prior to Admission medications   Medication Sig Start Date End Date Taking? Authorizing Provider  acetaminophen (TYLENOL) 500 MG tablet Take 1,000 mg by mouth every 6 (six) hours as needed for headache.   Yes Historical Provider, MD  albuterol (PROAIR HFA) 108 (90 BASE) MCG/ACT inhaler Inhale 2 puffs into the lungs every 6 (six) hours as needed for wheezing or shortness of breath. 06/27/13  Yes Pecola Lawless, MD  aspirin 81 MG chewable tablet Chew 81 mg by mouth daily.   Yes Historical Provider, MD  hydrALAZINE (APRESOLINE) 25 MG tablet Take 1 tablet (25 mg total) by mouth 3 (three) times daily. 08/07/13  Yes Pecola Lawless, MD  levofloxacin (LEVAQUIN) 500 MG tablet Take 500 mg by mouth daily. 10 day therapy course patient is on day 5 of therapy  07/16/13  Yes Georgina Quint Plotnikov, MD  losartan-hydrochlorothiazide (HYZAAR) 100-25 MG per tablet Take 1 tablet by mouth daily.   Yes Historical Provider, MD  metoprolol (LOPRESSOR) 100 MG tablet Take 100 mg by mouth 2 (two) times daily.   Yes Historical Provider, MD  pravastatin (PRAVACHOL) 40 MG tablet Take 40 mg by mouth daily.   Yes Historical Provider, MD   BP 153/87  Pulse 71  Temp(Src) 98.3 F (36.8 C) (Oral)  Resp 18  SpO2 99% Physical Exam  Nursing note and vitals reviewed. Constitutional: She appears well-developed and well-nourished. No distress.  HENT:  Head: Normocephalic and atraumatic.  Right Ear: Tympanic membrane and ear canal normal.  Dry appearing  ball of cerumen in left ear, obstructing view of TM.     Eyes: Conjunctivae and EOM are normal.  Neck: Neck supple.  Cardiovascular: Normal rate, regular rhythm and intact distal pulses.   No carotid bruits.  Pulmonary/Chest: Effort normal and breath sounds normal. No respiratory distress. She has no wheezes. She has no rales.  Abdominal: Soft. She exhibits no distension. There is no tenderness. There is no rebound and no guarding.  Musculoskeletal: She exhibits no edema.  Neurological: She is alert.  CN II-XII intact, EOMs intact, no pronator drift, grip strengths equal bilaterally; strength 5/5 in all extremities, sensation intact in all extremities; finger to nose, heel to shin, rapid alternating movements normal; gait is normal.     Skin: She is not diaphoretic.    ED Course  Procedures (including critical care time) Labs Review Labs Reviewed  BASIC METABOLIC PANEL - Abnormal; Notable for the following:    Sodium 136 (*)    Glucose, Bld 108 (*)    GFR calc non Af Amer 63 (*)    GFR calc Af Amer 73 (*)    All other components within normal limits  URINALYSIS, ROUTINE W REFLEX MICROSCOPIC - Abnormal; Notable for the following:    APPearance CLOUDY (*)    All other components within normal limits  TROPONIN I  CBC WITH DIFFERENTIAL    Imaging Review No results found.   EKG Interpretation None       Date: 08/20/2013  Rate: 69  Rhythm: normal sinus rhythm  QRS Axis: left  Intervals: normal  ST/T Wave abnormalities: nonspecific T wave changes  Conduction Disutrbances:none  Narrative Interpretation:   Old EKG Reviewed: none available    7:06 AM Discussed pt with Dr Lynelle Doctor.  8:44 AM  Discussed all results with patient.  Pt continues to feel lightheadedness despite great improvement of blood pressure - while she denies that there is any spinning sensation or abnormal balance it does seem to be positional.  Will give meclizine trial and also ambulate in the hallway.   Neurologic exam is normal.  Pt is very anxious appearing and admits to anxiety.    Pt ambulated in hallway without abnormality of gait, reports she felt fine with only occasional lightheadedness but no spinning and no sensation of being off balance.   MDM   Final diagnoses:  Lightheadedness  Hypertension  Tinnitus of both ears    Pt with hx HTN on multiple medications with recent adjustments by PCP, p/w ongoing tinnitus, lightheadedness.  The symptoms have been ongoing for approximately 5 weeks without changes.  Occasionally the tinnitus is very loud and this is what brought her in today.  Per her PCP's recent note, plan was for stopping clonidine, if tinnitus did not resolved she would be sent to ENT.  Chart reviewed: recent ED and PCP notes for similar complaints, PCP has done more thorough evaluation for tinnitus.  I have referred pt to ENT for further evaluation.  Her neurologic exam was completely normal here.  She did say the meclizine made the ringing in her ears quieter and may have helped somewhat with the lightheadedness.  D/C home with PCP and ENT follow up.  Discussed result, findings, treatment, and follow up  with patient.  Pt given return precautions.  Pt verbalizes understanding and agrees with plan.         Trixie Dredgemily Regie Bunner, PA-C 08/20/13 1542

## 2013-08-21 ENCOUNTER — Ambulatory Visit (INDEPENDENT_AMBULATORY_CARE_PROVIDER_SITE_OTHER): Payer: BC Managed Care – PPO

## 2013-08-21 DIAGNOSIS — I10 Essential (primary) hypertension: Secondary | ICD-10-CM

## 2013-08-21 DIAGNOSIS — IMO0001 Reserved for inherently not codable concepts without codable children: Secondary | ICD-10-CM

## 2013-08-21 DIAGNOSIS — E785 Hyperlipidemia, unspecified: Secondary | ICD-10-CM

## 2013-08-21 DIAGNOSIS — E1165 Type 2 diabetes mellitus with hyperglycemia: Secondary | ICD-10-CM

## 2013-08-21 LAB — BASIC METABOLIC PANEL
BUN: 10 mg/dL (ref 6–23)
CHLORIDE: 104 meq/L (ref 96–112)
CO2: 28 mEq/L (ref 19–32)
Calcium: 9.8 mg/dL (ref 8.4–10.5)
Creatinine, Ser: 1.1 mg/dL (ref 0.4–1.2)
GFR: 66.34 mL/min (ref >60.00)
Glucose, Bld: 111 mg/dL — ABNORMAL HIGH (ref 70–99)
POTASSIUM: 3.5 meq/L (ref 3.5–5.1)
SODIUM: 141 meq/L (ref 135–145)

## 2013-08-21 LAB — LIPID PANEL
Cholesterol: 168 mg/dL (ref 0–200)
HDL: 47.8 mg/dL (ref >39.00)
LDL Cholesterol: 106 mg/dL — ABNORMAL HIGH (ref 0–99)
Total CHOL/HDL Ratio: 4
Triglycerides: 69 mg/dL (ref 0.0–149.0)
VLDL: 13.8 mg/dL (ref 0.0–40.0)

## 2013-08-21 LAB — HEPATIC FUNCTION PANEL
ALBUMIN: 4.2 g/dL (ref 3.5–5.2)
ALK PHOS: 82 U/L (ref 39–117)
ALT: 30 U/L (ref 0–35)
AST: 32 U/L (ref 0–37)
BILIRUBIN TOTAL: 0.8 mg/dL (ref 0.2–1.2)
Bilirubin, Direct: 0.1 mg/dL (ref 0.0–0.3)
Total Protein: 8.4 g/dL — ABNORMAL HIGH (ref 6.0–8.3)

## 2013-08-21 LAB — HEMOGLOBIN A1C: Hgb A1c MFr Bld: 7.2 % — ABNORMAL HIGH (ref 4.6–6.5)

## 2013-08-21 LAB — TSH: TSH: 1.33 u[IU]/mL (ref 0.35–4.50)

## 2013-08-21 NOTE — ED Provider Notes (Signed)
Medical screening examination/treatment/procedure(s) were performed by non-physician practitioner and as supervising physician I was immediately available for consultation/collaboration.   EKG Interpretation   Date/Time:  Monday Aug 20 2013 06:12:27 EDT Ventricular Rate:  69 PR Interval:  134 QRS Duration: 96 QT Interval:  446 QTC Calculation: 478 R Axis:   -18 Text Interpretation:  Sinus rhythm Borderline left axis deviation RSR' in  V1 or V2, right VCD or RVH Nonspecific T abnormalities, anterior leads No  significant change since last tracing Confirmed by Olly Shiner  MD-J, Monasia Lair  ([id number]) on 08/21/2013 7:05:30 AM        Linwood Dibbles, MD 08/21/13 [phone]

## 2013-08-23 LAB — MICROALBUMIN / CREATININE URINE RATIO
Creatinine,U: 99.2 mg/dL
Microalb Creat Ratio: 0.6 mg/g (ref 0.0–30.0)
Microalb, Ur: 0.6 mg/dL (ref 0.0–1.9)

## 2013-08-25 ENCOUNTER — Other Ambulatory Visit: Payer: Self-pay | Admitting: Internal Medicine

## 2013-08-25 DIAGNOSIS — E1165 Type 2 diabetes mellitus with hyperglycemia: Principal | ICD-10-CM

## 2013-08-25 DIAGNOSIS — IMO0001 Reserved for inherently not codable concepts without codable children: Secondary | ICD-10-CM

## 2013-12-26 ENCOUNTER — Telehealth: Payer: Self-pay | Admitting: Internal Medicine

## 2013-12-26 NOTE — Telephone Encounter (Signed)
Patient Information:  Caller Name: Cynthia Forbes  Phone: [phone]  Patient: Cynthia Forbes, Cynthia Forbes  Gender: Female  DOB: 1950-10-19  Age: 63 Years  PCP: Marga MelnickHopper, William  Office Follow Up:  Does the office need to follow up with this patient?: No  Instructions For The Office: N/A   Symptoms  Reason For Call & Symptoms: Pt states she has tinnitus and has gotten worse.  Pt reports it has become worse after getting the flu shot.  Reviewed Health History In EMR: Yes  Reviewed Medications In EMR: Yes  Reviewed Allergies In EMR: Yes  Reviewed Surgeries / Procedures: Yes  Date of Onset of Symptoms: 12/19/2013  Guideline(s) Used:  Ear - Discharge  Disposition Per Guideline:   See Today in Office  Reason For Disposition Reached:   Patient wants to be seen  Advice Given:  Call Back If:  You become worse.  Patient Will Follow Care Advice:  YES  Appointment Scheduled:  12/27/2013 08:00:00 Appointment Scheduled Provider:  Other

## 2013-12-27 ENCOUNTER — Other Ambulatory Visit: Payer: Self-pay | Admitting: Internal Medicine

## 2013-12-27 ENCOUNTER — Ambulatory Visit (INDEPENDENT_AMBULATORY_CARE_PROVIDER_SITE_OTHER): Payer: 59 | Admitting: Family Medicine

## 2013-12-27 ENCOUNTER — Encounter: Payer: Self-pay | Admitting: Family Medicine

## 2013-12-27 VITALS — BP 158/100 | HR 90 | Temp 99.0°F | Ht 68.5 in | Wt 215.0 lb

## 2013-12-27 DIAGNOSIS — I10 Essential (primary) hypertension: Secondary | ICD-10-CM

## 2013-12-27 DIAGNOSIS — H9319 Tinnitus, unspecified ear: Secondary | ICD-10-CM

## 2013-12-27 NOTE — Progress Notes (Signed)
Pre visit review using our clinic review tool, if applicable. No additional management support is needed unless otherwise documented below in the visit note. 

## 2013-12-27 NOTE — Progress Notes (Signed)
No chief complaint on file.   HPI:  1) ringing in ears/pressure in ears/popping in ears/discharge from ear/itching in ears: -intermittent ringing in bilareral ears for a long time for many years, reports referred to ENT by PCP -he saw an ENT doctor a few months ago for this - he told her it looked good and hearing was fin after ear wax cleaned out -she scratched her ear with her finger and had a little red spot, she wondered if pimple in ear -wonders if has ear infection -denies: HA, pain in ear, fevers, chills, hearing loss  2)HTN: -reports she is so anxious when sees the doctor -report has white coat HTN and didn't take medications this morning and this is why it is elevated -reports her PCP knows her BP is always up in the office and is adjusting her meds -denies: CP, SOB, DOE, palpitations, swelling  ROS: See pertinent positives and negatives per HPI.  Past Medical History  Diagnosis Date  . Hypertension   . Bronchitis   . Kidney stones 2005 & 08/2011     passed spontaneously X 2  . Hyperlipemia   . Diabetes mellitus without complication     Past Surgical History  Procedure Laterality Date  . Tonsillectomy    . Abdominal hysterectomy  10/05/11    Dr Jennette KettleNeal  . Oophorectomy  10/05/11    degenerative fibroid; Dr Jennette KettleNeal  . Vaginal bleed  10/19/11    hematoma; Dr Marcelle OverlieHolland    No family history on file.  History   Social History  . Marital Status: Divorced    Spouse Name: N/A    Number of Children: N/A  . Years of Education: N/A   Social History Main Topics  . Smoking status: Never Smoker   . Smokeless tobacco: None  . Alcohol Use: No  . Drug Use: No  . Sexual Activity: Not Currently   Other Topics Concern  . None   Social History Narrative  . None    Current outpatient prescriptions:albuterol (PROAIR HFA) 108 (90 BASE) MCG/ACT inhaler, Inhale 2 puffs into the lungs every 6 (six) hours as needed for wheezing or shortness of breath., Disp: 6.7 g, Rfl: 0;  aspirin 81  MG chewable tablet, Chew 81 mg by mouth daily., Disp: , Rfl: ;  hydrALAZINE (APRESOLINE) 25 MG tablet, Take 1 tablet (25 mg total) by mouth 3 (three) times daily., Disp: 90 tablet, Rfl: 3 metoprolol (LOPRESSOR) 100 MG tablet, Take 100 mg by mouth 2 (two) times daily., Disp: , Rfl: ;  pravastatin (PRAVACHOL) 40 MG tablet, Take 40 mg by mouth daily., Disp: , Rfl:   EXAM:  Filed Vitals:   12/27/13 0829  BP: 158/100  Pulse: 90  Temp: 99 F (37.2 C)    Body mass index is 32.21 kg/(m^2).  GENERAL: vitals reviewed and listed above, alert, oriented, appears well hydrated and in no acute distress  HEENT: atraumatic, conjunttiva clear, no obvious abnormalities on inspection of external nose and ears, on inspection of ears there is a small clump of non-obstructive dark wax versus dried blood in the L ear canal, otherwise no abnormalities seen  NECK: no obvious masses on inspection  LUNGS: clear to auscultation bilaterally, no wheezes, rales or rhonchi, good air movement  CV: HRRR, no peripheral edema  MS: moves all extremities without noticeable abnormality  PSYCH: pleasant and cooperative, anxious patient  ASSESSMENT AND PLAN:  Discussed the following assessment and plan:  Tinnitus, unspecified laterality -discussed etiologies -advised to follow up with  her ENT doctor -she was worried about infection and there are no signs of infection on exam today  Essential hypertension -advised of risks of uncontrolled HTN and advised chang ein medication -she would prefer to discuss with her PCP, advised she schedule appt with PCP to recheck and monitor at home and call her PCP if elevated  -Patient advised to return or notify a doctor immediately if symptoms worsen or persist or new concerns arise.  Patient Instructions  -please schedule a follow up in the next few weeks with Dr. Alwyn Ren regarding your blood pressure  -monitor blood pressure at home, take medications every day and call Dr.  Alwyn Ren if BP running high prior to your appointment  -follow up with Dr. Jac Canavan about your persistent ringing in the ear      Cynthia Forbes.

## 2013-12-27 NOTE — Patient Instructions (Addendum)
-  please schedule a follow up in the next few weeks with Dr. Alwyn RenHopper regarding your blood pressure  -monitor blood pressure at home, take medications every day and call Dr. Alwyn RenHopper if BP running high prior to your appointment  -follow up with Dr. Jac CanavanKrauss about your persistent ringing in the ear

## 2013-12-28 ENCOUNTER — Telehealth: Payer: Self-pay | Admitting: Internal Medicine

## 2013-12-28 NOTE — Telephone Encounter (Signed)
emmi emailed °

## 2014-01-01 ENCOUNTER — Ambulatory Visit: Payer: 59 | Admitting: Internal Medicine

## 2014-01-02 ENCOUNTER — Ambulatory Visit (INDEPENDENT_AMBULATORY_CARE_PROVIDER_SITE_OTHER): Payer: 59 | Admitting: Internal Medicine

## 2014-01-02 ENCOUNTER — Other Ambulatory Visit: Payer: Self-pay | Admitting: Internal Medicine

## 2014-01-02 ENCOUNTER — Other Ambulatory Visit (INDEPENDENT_AMBULATORY_CARE_PROVIDER_SITE_OTHER): Payer: 59

## 2014-01-02 ENCOUNTER — Encounter: Payer: Self-pay | Admitting: Internal Medicine

## 2014-01-02 VITALS — BP 168/120 | HR 81 | Temp 98.2°F | Resp 13 | Wt 216.4 lb

## 2014-01-02 DIAGNOSIS — E1165 Type 2 diabetes mellitus with hyperglycemia: Secondary | ICD-10-CM

## 2014-01-02 DIAGNOSIS — IMO0002 Reserved for concepts with insufficient information to code with codable children: Secondary | ICD-10-CM

## 2014-01-02 DIAGNOSIS — H9313 Tinnitus, bilateral: Secondary | ICD-10-CM

## 2014-01-02 DIAGNOSIS — Z566 Other physical and mental strain related to work: Secondary | ICD-10-CM

## 2014-01-02 DIAGNOSIS — I10 Essential (primary) hypertension: Secondary | ICD-10-CM

## 2014-01-02 LAB — HEMOGLOBIN A1C: HEMOGLOBIN A1C: 6.9 % — AB (ref 4.6–6.5)

## 2014-01-02 LAB — MICROALBUMIN / CREATININE URINE RATIO
Creatinine,U: 90.6 mg/dL
MICROALB UR: 2.7 mg/dL — AB (ref 0.0–1.9)
MICROALB/CREAT RATIO: 3 mg/g (ref 0.0–30.0)

## 2014-01-02 MED ORDER — CITALOPRAM HYDROBROMIDE 20 MG PO TABS: 20.0000 mg | ORAL_TABLET | Freq: Every day | ORAL | Status: DC

## 2014-01-02 MED ORDER — OLMESARTAN MEDOXOMIL 40 MG PO TABS: 40.0000 mg | ORAL_TABLET | Freq: Every day | ORAL | Status: DC

## 2014-01-02 NOTE — Progress Notes (Signed)
   Subjective:    Patient ID: Cynthia Forbes, female    DOB: 1950-09-10, 63 y.o.   MRN: [id number]  HPI   Blood pressures at home ranges 117-126/75-90. She states that her blood pressure was previously better controlled on Micardis. She was changed from this to hydralazine after being evaluated in the emergency room  She was seen 12/26/13 by Dr. Selena BattenKim but no change made in her medications  She has been compliant with her medicines and denies adverse effects.  She does have persistent tinnitus and is followed by Dr. Dorma RussellKraus    Review of Systems   Chest pain, palpitations, tachycardia, exertional dyspnea, paroxysmal nocturnal dyspnea, claudication or edema are absent.      Objective:   Physical Exam Appears healthy and well-nourished & in no acute distress   Arteriolar narrowing with AV crossing changes  No carotid bruits are present.No neck vein distention present at 10 - 15 degrees. Thyroid normal to palpation  Heart rhythm and rate are normal with no gallop or murmur  Chest is clear with no increased work of breathing  There is no evidence of aortic aneurysm or renal artery bruits  Abdomen soft with no organomegaly or masses. No HJR  No clubbing, cyanosis or edema present.  Pedal pulses are intact   No ischemic skin changes are present . Fingernails healthy   Alert and oriented. Strength, tone, DTRs reflexes normal          Assessment & Plan:  See Current Assessment & Plan in Problem List under specific Diagnosis

## 2014-01-02 NOTE — Patient Instructions (Signed)

## 2014-01-02 NOTE — Progress Notes (Signed)
Pre visit review using our clinic review tool, if applicable. No additional management support is needed unless otherwise documented below in the visit note. 

## 2014-01-02 NOTE — Assessment & Plan Note (Signed)
Avoid ASA & noise excess

## 2014-01-02 NOTE — Assessment & Plan Note (Addendum)
Benicar 40 mg added  BMET

## 2014-01-02 NOTE — Assessment & Plan Note (Signed)
A1c , urine microalbumin, BMET 

## 2014-01-03 LAB — BASIC METABOLIC PANEL
BUN: 9 mg/dL (ref 6–23)
CO2: 30 mEq/L (ref 19–32)
Calcium: 9.5 mg/dL (ref 8.4–10.5)
Chloride: 106 mEq/L (ref 96–112)
Creatinine, Ser: 0.9 mg/dL (ref 0.4–1.2)
GFR: 86.18 mL/min (ref >60.00)
Glucose, Bld: 89 mg/dL (ref 70–99)
POTASSIUM: 4 meq/L (ref 3.5–5.1)
Sodium: 142 mEq/L (ref 135–145)

## 2014-01-11 ENCOUNTER — Other Ambulatory Visit: Payer: Self-pay

## 2014-01-11 IMAGING — CT CT ABD-PELV W/ CM
1 of 3 series · 13 of 32 positions shown, 18 images · IV contrast (APPLIED)
Comparison: 05/27/2005

CLINICAL DATA: Abdominal pain.

CT ABDOMEN AND PELVIS WITH CONTRAST
TECHNIQUE: Multidetector CT imaging of the abdomen and pelvis was
performed following the standard protocol during bolus
administration of intravenous contrast.
Contrast: 100mL OMNIPAQUE IOHEXOL 300 MG/ML  SOLN

[Series 2: abd/pel with · axial · 0.78mm/px · z∈[-346,+59]mm · 13 of 91 slices shown, 18 images]
[im 5/91  soft-tissue]
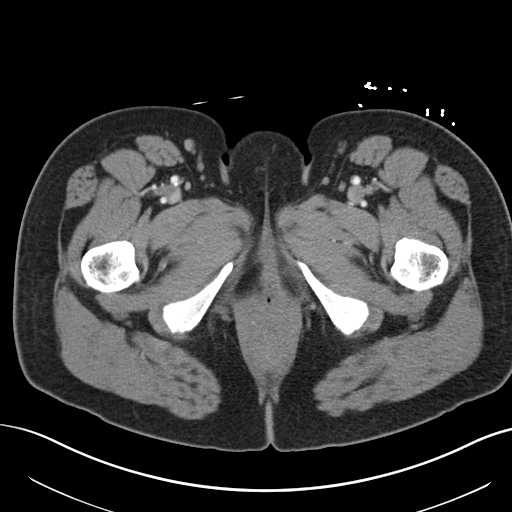
[im 5/91  bone]
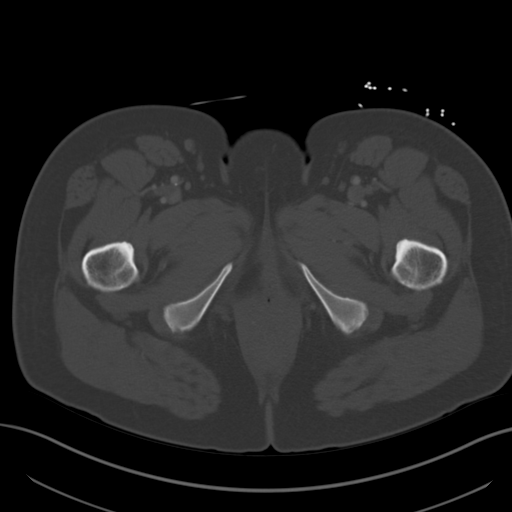
[im 14/91  soft-tissue]
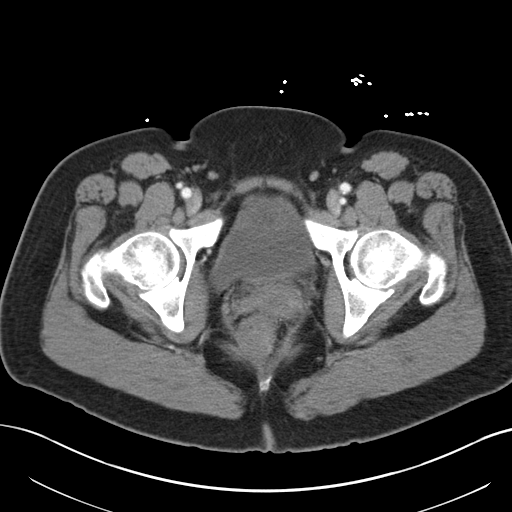
[im 19/91  soft-tissue]
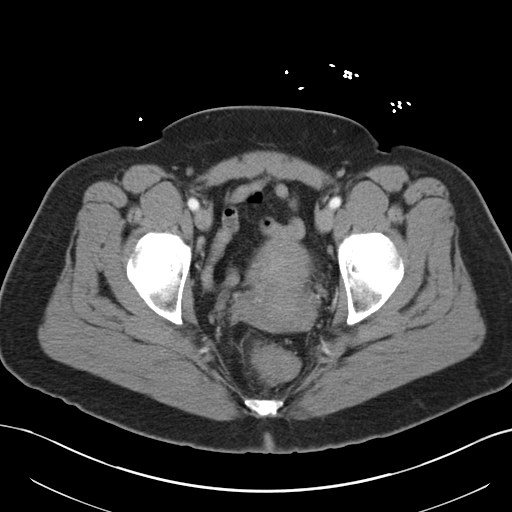
[im 28/91  soft-tissue]
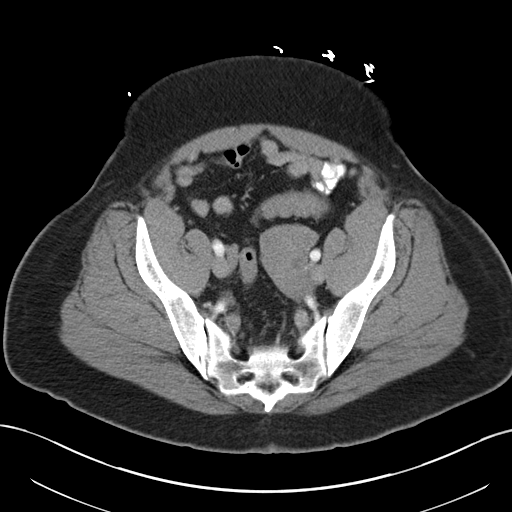
[im 37/91  soft-tissue]
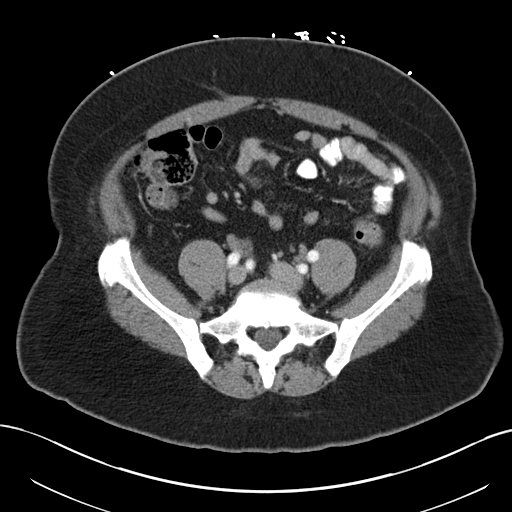
[im 41/91  soft-tissue]
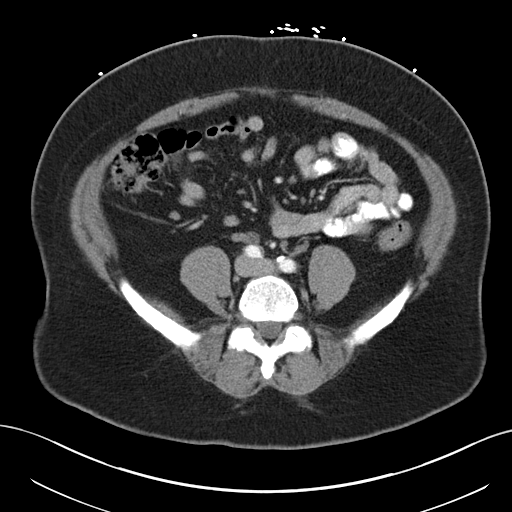
[im 50/91  soft-tissue]
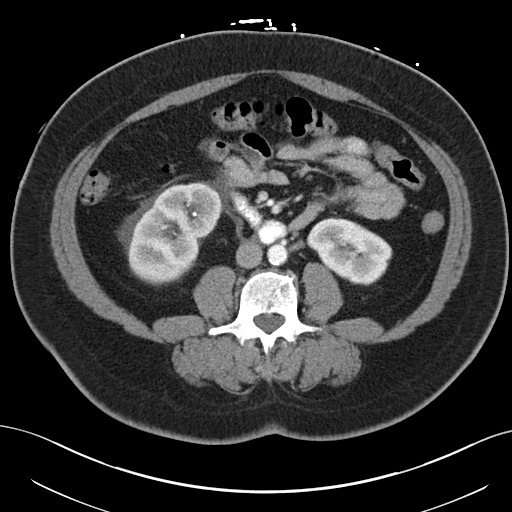
[im 55/91  soft-tissue]
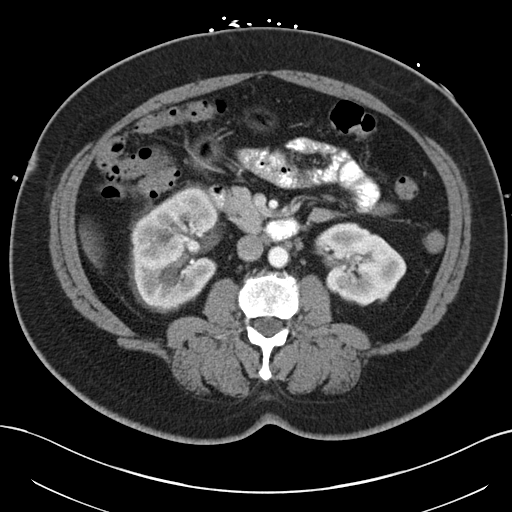
[im 64/91  soft-tissue]
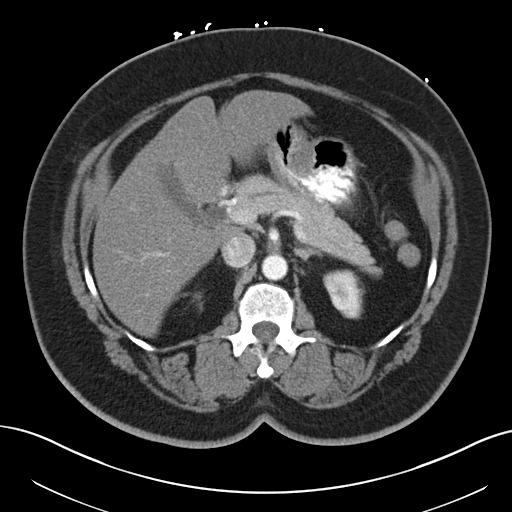
[im 64/91  bone]
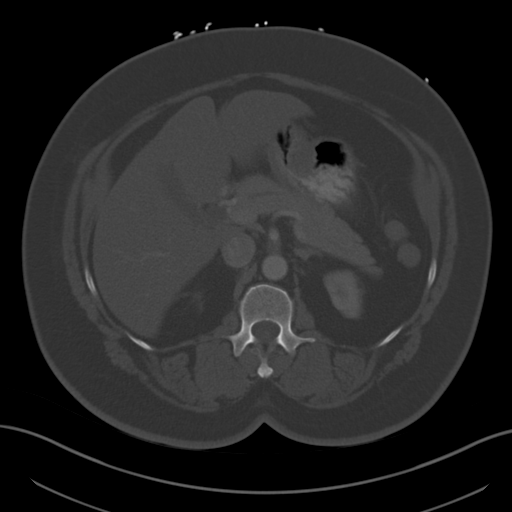
[im 73/91  soft-tissue]
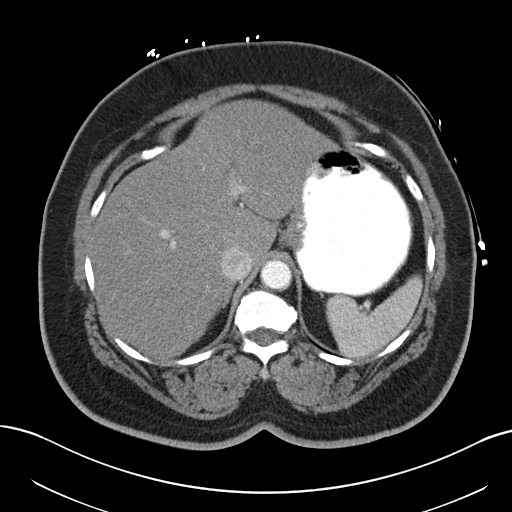
[im 73/91  lung]
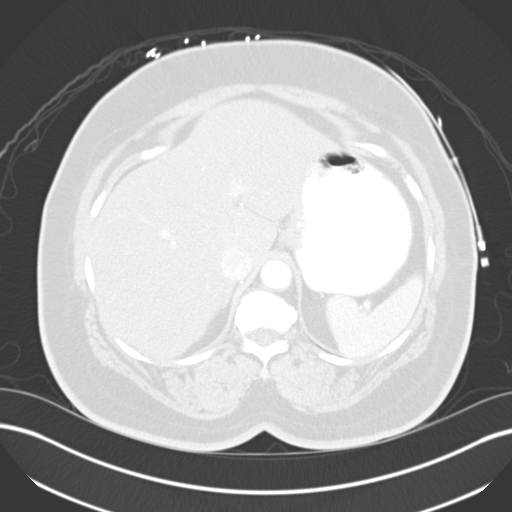
[im 77/91  soft-tissue]
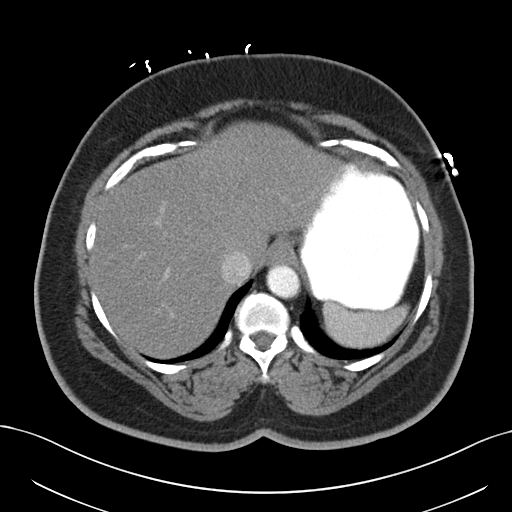
[im 77/91  lung]
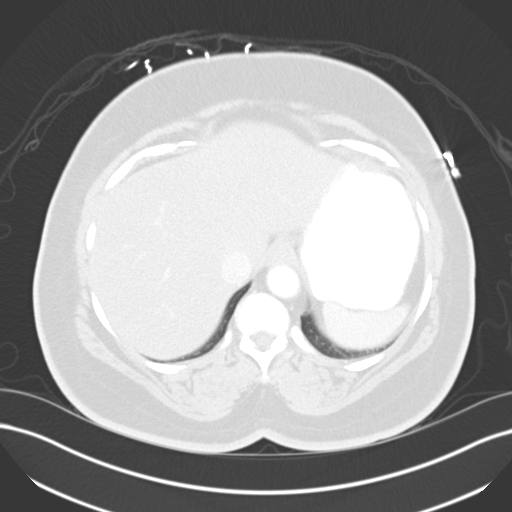
[im 82/91  lung]
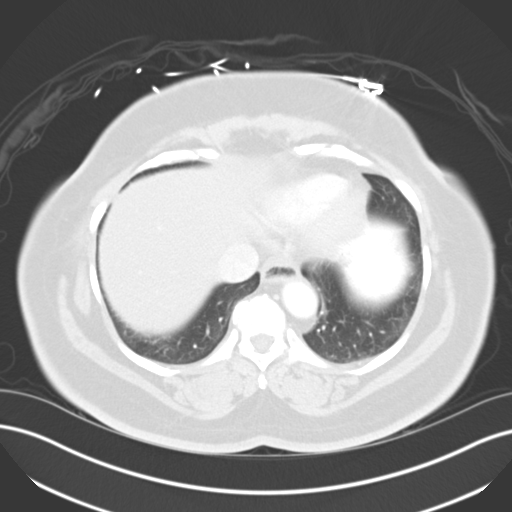
[im 86/91  soft-tissue]
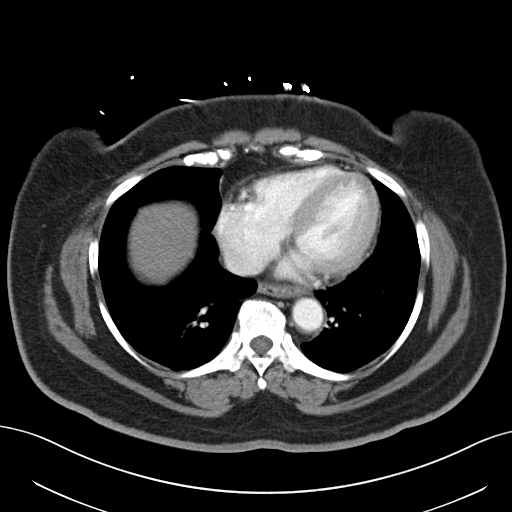
[im 86/91  lung]
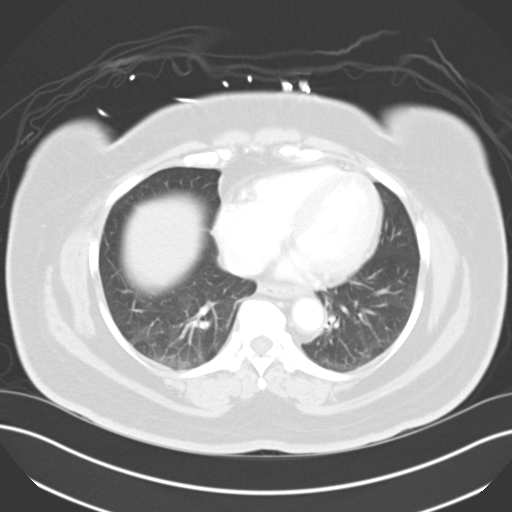

[13 of 32 positions shown; findings below may reference images not displayed]

FINDINGS: There are patchy ground-glass densities at the right lung
base.  Findings could represent atelectasis.  There is no evidence
for free air.

There is fluid surrounding the right kidney with dilatation of the
right renal collecting system and right ureter. Delayed excretion
of contrast from the right kidney.  There is a stone at the right
ureterovesical junction that measures up to 5 mm.  There is a 4 mm
stone in the right kidney.

Normal appearance of the adrenal tissue, left kidney, spleen,
pancreas and gallbladder.  The portal venous system is patent.
There is diffusely decreased attenuation of the liver suggesting
hepatic steatosis.  There is a 1.6 cm low density structure in the
right hepatic dome which probably represents a cyst.

The left ovarian tissue is markedly enlarged measuring 4.8 x 4.3 x
4.5 cm on sequence 2, image 63.  This tissue measured 4.1 x 3.2 cm
in 2778.   It is difficult to differentiate the left adnexal tissue
from the adjacent uterus.  Findings raise concern for a solid
ovarian lesion.  The right adnexal tissue appears to be grossly
normal.  There is no obvious lymphadenopathy or peritoneal disease.
No evidence for free fluid.

There is mild atherosclerotic disease involving the left
superficial femoral artery without occlusion.  No evidence for an
aortic aneurysm. Degenerative facet changes in the lower lumbar
spine.  No acute bony abnormality.
IMPRESSION: Moderate right hydroureteronephrosis due to obstructing 5 mm stone
at the right ureterovesical junction.  There is also a
nonobstructive 4 mm stone in the right kidney.

Enlargement of the left adnexa tissue as described.  A solid
ovarian lesion cannot be excluded.  Recommend further
characterization with a pelvic ultrasound.

Hepatic steatosis and probable liver cyst.

Few patchy densities at the right lung base are most likely related
to atelectasis.

These results were called by telephone on 08/22/2010  at  [DATE] a.m.
to  Dr. Rokna, who verbally acknowledged these results.

## 2014-01-22 ENCOUNTER — Encounter (HOSPITAL_COMMUNITY): Payer: Self-pay | Admitting: Emergency Medicine

## 2014-01-22 ENCOUNTER — Emergency Department (HOSPITAL_COMMUNITY)
Admission: EM | Admit: 2014-01-22 | Discharge: 2014-01-23 | Disposition: A | Discharge status: Home / Self Care | Payer: 59 | Attending: Emergency Medicine | Admitting: Emergency Medicine

## 2014-01-22 DIAGNOSIS — R000 Tachycardia, unspecified: Secondary | ICD-10-CM | POA: Insufficient documentation

## 2014-01-22 DIAGNOSIS — Z8709 Personal history of other diseases of the respiratory system: Secondary | ICD-10-CM | POA: Insufficient documentation

## 2014-01-22 DIAGNOSIS — F419 Anxiety disorder, unspecified: Secondary | ICD-10-CM | POA: Insufficient documentation

## 2014-01-22 DIAGNOSIS — H9313 Tinnitus, bilateral: Secondary | ICD-10-CM | POA: Diagnosis not present

## 2014-01-22 DIAGNOSIS — Z79899 Other long term (current) drug therapy: Secondary | ICD-10-CM | POA: Diagnosis not present

## 2014-01-22 DIAGNOSIS — R51 Headache: Secondary | ICD-10-CM | POA: Diagnosis not present

## 2014-01-22 DIAGNOSIS — E785 Hyperlipidemia, unspecified: Secondary | ICD-10-CM | POA: Diagnosis not present

## 2014-01-22 DIAGNOSIS — E119 Type 2 diabetes mellitus without complications: Secondary | ICD-10-CM | POA: Diagnosis not present

## 2014-01-22 DIAGNOSIS — Z87442 Personal history of urinary calculi: Secondary | ICD-10-CM | POA: Diagnosis not present

## 2014-01-22 DIAGNOSIS — I10 Essential (primary) hypertension: Secondary | ICD-10-CM | POA: Diagnosis present

## 2014-01-22 DIAGNOSIS — Z7982 Long term (current) use of aspirin: Secondary | ICD-10-CM | POA: Insufficient documentation

## 2014-01-22 LAB — CBC WITH DIFFERENTIAL/PLATELET
BASOS ABS: 0 10*3/uL (ref 0.0–0.1)
Basophils Relative: 0 % (ref 0–1)
Eosinophils Absolute: 0.1 10*3/uL (ref 0.0–0.7)
Eosinophils Relative: 1 % (ref 0–5)
HEMATOCRIT: 42.3 % (ref 36.0–46.0)
HEMOGLOBIN: 13.1 g/dL (ref 12.0–15.0)
LYMPHS PCT: 31 % (ref 12–46)
Lymphs Abs: 2.5 10*3/uL (ref 0.7–4.0)
MCH: 25.7 pg — ABNORMAL LOW (ref 26.0–34.0)
MCHC: 31 g/dL (ref 30.0–36.0)
MCV: 83.1 fL (ref 78.0–100.0)
MONO ABS: 0.5 10*3/uL (ref 0.1–1.0)
MONOS PCT: 7 % (ref 3–12)
NEUTROS ABS: 5 10*3/uL (ref 1.7–7.7)
Neutrophils Relative %: 61 % (ref 43–77)
Platelets: 326 10*3/uL (ref 150–400)
RBC: 5.09 MIL/uL (ref 3.87–5.11)
RDW: 14.2 % (ref 11.5–15.5)
WBC: 8 10*3/uL (ref 4.0–10.5)

## 2014-01-22 LAB — COMPREHENSIVE METABOLIC PANEL
ALT: 13 U/L (ref 0–35)
AST: 15 U/L (ref 0–37)
Albumin: 3.9 g/dL (ref 3.5–5.2)
Alkaline Phosphatase: 100 U/L (ref 39–117)
Anion gap: 15 (ref 5–15)
BILIRUBIN TOTAL: 0.2 mg/dL — AB (ref 0.3–1.2)
BUN: 11 mg/dL (ref 6–23)
CHLORIDE: 105 meq/L (ref 96–112)
CO2: 25 meq/L (ref 19–32)
CREATININE: 0.92 mg/dL (ref 0.50–1.10)
Calcium: 9.4 mg/dL (ref 8.4–10.5)
GFR calc Af Amer: 76 mL/min — ABNORMAL LOW (ref >90)
GFR, EST NON AFRICAN AMERICAN: 66 mL/min — AB (ref >90)
Glucose, Bld: 127 mg/dL — ABNORMAL HIGH (ref 70–99)
Potassium: 3.9 mEq/L (ref 3.7–5.3)
Sodium: 145 mEq/L (ref 137–147)
Total Protein: 8.4 g/dL — ABNORMAL HIGH (ref 6.0–8.3)

## 2014-01-22 LAB — URINALYSIS, ROUTINE W REFLEX MICROSCOPIC
BILIRUBIN URINE: NEGATIVE
Glucose, UA: NEGATIVE mg/dL
HGB URINE DIPSTICK: NEGATIVE
KETONES UR: NEGATIVE mg/dL
Nitrite: NEGATIVE
PH: 7.5 (ref 5.0–8.0)
PROTEIN: NEGATIVE mg/dL
Specific Gravity, Urine: 1.013 (ref 1.005–1.030)
Urobilinogen, UA: 0.2 mg/dL (ref 0.0–1.0)

## 2014-01-22 LAB — I-STAT TROPONIN, ED: TROPONIN I, POC: 0.01 ng/mL (ref 0.00–0.08)

## 2014-01-22 LAB — URINE MICROSCOPIC-ADD ON

## 2014-01-22 MED ORDER — HYDRALAZINE HCL 25 MG PO TABS
25.0000 mg | ORAL_TABLET | Freq: Once | ORAL | Status: AC
  Administered 2014-01-22: 25 mg via ORAL
  Filled 2014-01-22: qty 1

## 2014-01-22 MED ORDER — METOPROLOL TARTRATE 25 MG PO TABS
25.0000 mg | ORAL_TABLET | Freq: Once | ORAL | Status: AC
  Administered 2014-01-22: 25 mg via ORAL
  Filled 2014-01-22: qty 1

## 2014-01-22 MED ORDER — METOPROLOL TARTRATE 25 MG PO TABS
100.0000 mg | ORAL_TABLET | Freq: Once | ORAL | Status: AC
  Administered 2014-01-22: 100 mg via ORAL
  Filled 2014-01-22: qty 4

## 2014-01-22 NOTE — ED Notes (Signed)
Pt presents with c/o hypertension. Pt reports that she noticed pressure in her head today. Also felt some ringing in her ears today but says that this has been constant for her recently and she has been seeing a doctor for the ringing. Pt has a hx of HTN.

## 2014-01-22 NOTE — ED Provider Notes (Signed)
CSN: [id number]     Arrival date & time 01/22/14  2033 History   First MD Initiated Contact with Patient 01/22/14 2114     Chief Complaint  Patient presents with  . Hypertension   Cynthia FreibergShelah D Forbes is a [age] y.o. female with history of hypertension and tinnitus presents to the ED complaining of high blood pressure. She reports her blood pressure was 170/117 at home. She also complains of some "head pressure" that is generalized about her head. She reports this began today around 5 PM. She states that she has new glasses and this may be contributing to her "head pressure." She reports a long history of having problem with her elevated blood pressure. She reports adhering to her medications. She does state that she has not had her evening dose of metoprolol for hydralazine, but she has taken Benicar 40 mg today. She states she has a history of tinnitus that is constant and unchanging. She reports that she is worried the hydralazine is not working. She reports that she feels very anxious when coming to the hospital and thinks this can contribute to her elevated blood pressure. The patient denies fever, chills, changes to her vision, dizziness, loss of coordination, difficulty speaking, weakness, numbness, chest pain, shortness of breath, palpitations, abdominal pain, nausea, vomiting, dysuria, hematuria.  (Consider location/radiation/quality/duration/timing/severity/associated sxs/prior Treatment) Patient is a 63 y.o. female presenting with hypertension. The history is provided by the patient and a relative.  Hypertension This is a chronic problem. The current episode started more than 1 year ago. The problem has been unchanged. Associated symptoms include headaches. Pertinent negatives include no abdominal pain, anorexia, arthralgias, change in bowel habit, chest pain, chills, congestion, coughing, diaphoresis, fatigue, fever, joint swelling, myalgias, nausea, neck pain, numbness, rash, sore throat, urinary  symptoms, vertigo, visual change, vomiting or weakness.    Past Medical History  Diagnosis Date  . Hypertension   . Bronchitis   . Kidney stones 2005 & 08/2011     passed spontaneously X 2  . Hyperlipemia   . Diabetes mellitus without complication    Past Surgical History  Procedure Laterality Date  . Tonsillectomy    . Abdominal hysterectomy  10/05/11    Dr Jennette KettleNeal  . Oophorectomy  10/05/11    degenerative fibroid; Dr Jennette KettleNeal  . Vaginal bleed  10/19/11    hematoma; Dr Marcelle OverlieHolland   No family history on file. History  Substance Use Topics  . Smoking status: Never Smoker   . Smokeless tobacco: Not on file  . Alcohol Use: No   OB History   Grav Para Term Preterm Abortions TAB SAB Ect Mult Living                 Review of Systems  Constitutional: Negative for fever, chills, diaphoresis and fatigue.  HENT: Negative for congestion, facial swelling, postnasal drip, rhinorrhea, sinus pressure, sore throat and trouble swallowing.   Eyes: Negative for visual disturbance.  Respiratory: Negative for cough, shortness of breath and wheezing.   Cardiovascular: Negative for chest pain, palpitations and leg swelling.  Gastrointestinal: Negative for nausea, vomiting, abdominal pain, diarrhea, blood in stool, anorexia and change in bowel habit.  Genitourinary: Negative for dysuria, urgency, frequency, hematuria, flank pain and difficulty urinating.  Musculoskeletal: Negative for arthralgias, back pain, gait problem, joint swelling, myalgias, neck pain and neck stiffness.  Skin: Negative for rash.  Neurological: Positive for headaches. Negative for dizziness, vertigo, seizures, syncope, facial asymmetry, speech difficulty, weakness, light-headedness and numbness.  Psychiatric/Behavioral: Negative  for confusion. The patient is nervous/anxious.   All other systems reviewed and are negative.     Allergies  Codeine; Ivp dye; Clonidine derivatives; and Strawberry  Home Medications   Prior to  Admission medications   Medication Sig Start Date End Date Taking? Authorizing Provider  aspirin 81 MG chewable tablet Chew 81 mg by mouth daily.   Yes Historical Provider, MD  ibuprofen (ADVIL,MOTRIN) 200 MG tablet Take 200 mg by mouth every 6 (six) hours as needed for moderate pain.   Yes Historical Provider, MD  metoprolol (LOPRESSOR) 100 MG tablet Take 100 mg by mouth 2 (two) times daily.   Yes Historical Provider, MD  olmesartan (BENICAR) 40 MG tablet Take 1 tablet (40 mg total) by mouth daily. 01/02/14  Yes Pecola LawlessWilliam F Hopper, MD  pravastatin (PRAVACHOL) 40 MG tablet Take 40 mg by mouth at bedtime.    Yes Historical Provider, MD  albuterol (PROAIR HFA) 108 (90 BASE) MCG/ACT inhaler Inhale 2 puffs into the lungs every 6 (six) hours as needed for wheezing or shortness of breath. 06/27/13   Pecola LawlessWilliam F Hopper, MD  citalopram (CELEXA) 20 MG tablet Take 1 tablet (20 mg total) by mouth daily. 01/02/14   Pecola LawlessWilliam F Hopper, MD  hydrALAZINE (APRESOLINE) 25 MG tablet Take two tablets (50 mg total) in the morning and evening. Take one tablet (25 mg) total midday. 01/23/14   Einar GipWilliam Duncan Autie Vasudevan, PA   BP 141/85  Pulse 66  Temp(Src) 99.3 F (37.4 C) (Oral)  Resp 19  Ht 5' 8.25" (1.734 m)  Wt 217 lb (98.431 kg)  BMI 32.74 kg/m2  SpO2 99% Physical Exam  Nursing note and vitals reviewed. Constitutional: She is oriented to person, place, and time. She appears well-developed and well-nourished. No distress.  HENT:  Head: Normocephalic and atraumatic.  Right Ear: External ear normal.  Left Ear: External ear normal.  Mouth/Throat: Oropharynx is clear and moist.  TMs intact bilaterally. No erythema or loss of landmarks.  Eyes: Conjunctivae and EOM are normal. Pupils are equal, round, and reactive to light. Right eye exhibits no discharge. Left eye exhibits no discharge. No scleral icterus.  Neck: Neck supple.  Cardiovascular: Regular rhythm, normal heart sounds and intact distal pulses.  Exam reveals no  gallop and no friction rub.   No murmur heard. Tachycardic at 108  Pulmonary/Chest: Effort normal and breath sounds normal. No respiratory distress. She has no wheezes. She has no rales.  Abdominal: Soft. Bowel sounds are normal. She exhibits no distension and no mass. There is no tenderness. There is no rebound and no guarding.  Musculoskeletal: She exhibits no edema.  Lymphadenopathy:    She has no cervical adenopathy.  Neurological: She is alert and oriented to person, place, and time. She has normal strength and normal reflexes. She displays normal reflexes. No cranial nerve deficit or sensory deficit. She exhibits normal muscle tone. Coordination and gait normal.  Skin: Skin is warm and dry. No rash noted. She is not diaphoretic. No erythema. No pallor.  Psychiatric: Her speech is normal and behavior is normal. Her mood appears anxious. She does not exhibit a depressed mood.    ED Course  Procedures (including critical care time) Labs Review Labs Reviewed  CBC WITH DIFFERENTIAL - Abnormal; Notable for the following:    MCH 25.7 (*)    All other components within normal limits  COMPREHENSIVE METABOLIC PANEL - Abnormal; Notable for the following:    Glucose, Bld 127 (*)    Total Protein  8.4 (*)    Total Bilirubin 0.2 (*)    GFR calc non Af Amer 66 (*)    GFR calc Af Amer 76 (*)    All other components within normal limits  URINALYSIS, ROUTINE W REFLEX MICROSCOPIC - Abnormal; Notable for the following:    Leukocytes, UA TRACE (*)    All other components within normal limits  URINE MICROSCOPIC-ADD ON - Abnormal; Notable for the following:    Squamous Epithelial / LPF FEW (*)    Bacteria, UA FEW (*)    All other components within normal limits  URINE CULTURE  I-STAT TROPOININ, ED    Imaging Review No results found.   EKG Interpretation None      Filed Vitals:   01/22/14 2330 01/22/14 2345 01/23/14 0000 01/23/14 0015  BP: 146/82 134/83 143/89 141/85  Pulse: 63 66 64 66   Temp:      TempSrc:      Resp: 16 21 20 19   Height:      Weight:      SpO2: 98% 98% 98% 99%     MDM   Meds given in ED:  Medications  metoprolol tartrate (LOPRESSOR) tablet 100 mg (100 mg Oral Given 01/22/14 2210)  hydrALAZINE (APRESOLINE) tablet 25 mg (25 mg Oral Given 01/22/14 2219)  metoprolol tartrate (LOPRESSOR) tablet 25 mg (25 mg Oral Given 01/22/14 2219)    Discharge Medication List as of 01/23/2014 12:25 AM      Final diagnoses:  Essential hypertension  Tinnitus, bilateral   Cynthia Forbes is a 62 y.o. female who presented to ED complaining of high blood pressure and "head pressure." On arrival she was hypertensive at 193/111 and tachycardic at 104. She reports she has not taken her evening dose of metoprolol or hydralazine. Lab work revealed a negative troponin, a CBC with an MCH of 25.7 and otherwise normal. CMP with mildly elevated glucose 127 elevated protein 4 GFR 76. Her urinalysis revealed 3-6 white blood cells with few squamous epithelial cells and therefore will culture despite her lack of urinary symptoms. Overall, lab work is acceptable. After the patient received her evening dose of metoprolol 100 mg and hydralazine 25 mg her blood pressure improved with a pressure of 134/83 and a pulse of 70. Reports her anxiety has decreased. She reports her head pressure has resolved and would like to be discharged. Dr. Donnald Garre evaluated this patient and suggested we increase her dose of hydralazine to 50 mg every morning and every evening and 25 mg at midday. Advised patient to continue taking metoprolol 100 mg twice a day as well as Benicar 40 mg once a day. Advised the patient follow up with her primary care provider tomorrow for blood pressure check. Advised the patient return to the ED if new or worsening symptoms or new concerns. The patient verbalizes understanding and agreement with plan. Patient was treated and evaluated in conjunction with Dr.  Donnald Garre.    Lawana Chambers, [place] 01/23/14 [phone]

## 2014-01-23 MED ORDER — HYDRALAZINE HCL 25 MG PO TABS: ORAL_TABLET | ORAL | Status: DC

## 2014-01-23 NOTE — ED Provider Notes (Signed)
Medical screening examination/treatment/procedure(s) were conducted as a shared visit with non-physician practitioner(s) and myself.  I personally evaluated the patient during the encounter.   EKG Interpretation None     The patient presented reporting high blood pressure at home. She reports compliance with her medications and yet consistently having difficulty keeping her blood pressure and a normal range. The patient reports some generalized head pressure but no neurologic dysfunction.  As I come in the room she is alert and appropriate. There are no signs of confusion. Her movements are coordinated and purposeful symmetric.  Patient responded well to administration of her medications. The hydralazine dose will be increased. The patient is to follow-up with her family physician for several rechecks and non-stressful situations.  Arby BarretteMarcy Jolea Dolle, MD 01/23/14 [phone]

## 2014-01-23 NOTE — Discharge Instructions (Signed)
Hypertension °Hypertension, commonly called high blood pressure, is when the force of blood pumping through your arteries is too strong. Your arteries are the blood vessels that carry blood from your heart throughout your body. A blood pressure reading consists of a higher number over a lower number, such as 110/72. The higher number (systolic) is the pressure inside your arteries when your heart pumps. The lower number (diastolic) is the pressure inside your arteries when your heart relaxes. Ideally you want your blood pressure below 120/80. °Hypertension forces your heart to work harder to pump blood. Your arteries may become narrow or stiff. Having hypertension puts you at risk for heart disease, stroke, and other problems.  °RISK FACTORS °Some risk factors for high blood pressure are controllable. Others are not.  °Risk factors you cannot control include:  °· Race. You may be at higher risk if you are African American. °· Age. Risk increases with age. °· Gender. Men are at higher risk than women before age 45 years. After age 65, women are at higher risk than men. °Risk factors you can control include: °· Not getting enough exercise or physical activity. °· Being overweight. °· Getting too much fat, sugar, calories, or salt in your diet. °· Drinking too much alcohol. °SIGNS AND SYMPTOMS °Hypertension does not usually cause signs or symptoms. Extremely high blood pressure (hypertensive crisis) may cause headache, anxiety, shortness of breath, and nosebleed. °DIAGNOSIS  °To check if you have hypertension, your health care provider will measure your blood pressure while you are seated, with your arm held at the level of your heart. It should be measured at least twice using the same arm. Certain conditions can cause a difference in blood pressure between your right and left arms. A blood pressure reading that is higher than normal on one occasion does not mean that you need treatment. If one blood pressure reading  is high, ask your health care provider about having it checked again. °TREATMENT  °Treating high blood pressure includes making lifestyle changes and possibly taking medicine. Living a healthy lifestyle can help lower high blood pressure. You may need to change some of your habits. °Lifestyle changes may include: °· Following the DASH diet. This diet is high in fruits, vegetables, and whole grains. It is low in salt, red meat, and added sugars. °· Getting at least 2½ hours of brisk physical activity every week. °· Losing weight if necessary. °· Not smoking. °· Limiting alcoholic beverages. °· Learning ways to reduce stress. ° If lifestyle changes are not enough to get your blood pressure under control, your health care provider may prescribe medicine. You may need to take more than one. Work closely with your health care provider to understand the risks and benefits. °HOME CARE INSTRUCTIONS °· Have your blood pressure rechecked as directed by your health care provider.   °· Take medicines only as directed by your health care provider. Follow the directions carefully. Blood pressure medicines must be taken as prescribed. The medicine does not work as well when you skip doses. Skipping doses also puts you at risk for problems.   °· Do not smoke.   °· Monitor your blood pressure at home as directed by your health care provider.  °SEEK MEDICAL CARE IF:  °· You think you are having a reaction to medicines taken. °· You have recurrent headaches or feel dizzy. °· You have swelling in your ankles. °· You have trouble with your vision. °SEEK IMMEDIATE MEDICAL CARE IF: °· You develop a severe headache or confusion. °·   You have unusual weakness, numbness, or feel faint.  You have severe chest or abdominal pain.  You vomit repeatedly.  You have trouble breathing. MAKE SURE YOU:   Understand these instructions.  Will watch your condition.  Will get help right away if you are not doing well or get worse. Document  Released: 03/15/2005 Document Revised: 07/30/2013 Document Reviewed: 01/05/2013 [facility] Patient Information 2015 [employer] [place]. This information is not intended to replace advice given to you by your health care provider. Make sure you discuss any questions you have with your health care provider.  Please continue to take all your blood pressure medications. Please increase your dose of Hydralazine - now take two tablets (50 mg total) every morning and evening and take one tablet (25 mg total) at midday.  Continue taking metoprolol 100 mg twice a day. Continue taking Benicar 40 mg once daily. Please follow-up with your primary care provider tomorrow for blood pressure check.

## 2014-01-24 ENCOUNTER — Telehealth: Payer: Self-pay | Admitting: *Deleted

## 2014-01-24 LAB — URINE CULTURE: Special Requests: NORMAL

## 2014-01-24 NOTE — Telephone Encounter (Signed)
[facility] Triage Call Report Triage Record Num: [id number] Operator: Frederico HammanSusan Trenor Patient Name: Cynthia Forbes Call Date & Time: 01/22/2014 7:07:26PM Patient Phone: [phone] PCP: Marga MelnickWilliam Hopper Patient Gender: Female PCP Fax : [phone] Patient DOB: May 08, 1950 [facility] Name[facility] Reason for Call: Caller: Cynthia Forbes/Patient; PCP: Marga MelnickHopper, William; CB#: [phone]; Call regarding Ringing in ears; Cynthia Forbes states her BP is higher than normal. HAs had ringing in ears in AM and PM which causes her to not sleep well. Thinks lack of sleep affects her blood pressure. States BP at 17:50 was 181/116 in right arm and 172/115 in left arm. States BP in right arm was 177/120 at 18:30. BP at 19:38 is 199/133. States BP over the last week has ranged from 151 to 180 systolic over 91 to 117 diastolic. Per hypertension, diagnosed or suspected protocol has see provider within next 4 hours due to Systolic blood pressure >15780mmHg or diastolic blood pressure of more than 120mmHg. Advised to be seen within next 4 hours. States [facility]. Care advice given. Advised to follow up with office if needed. Protocol(s) Used: Hypertension, Diagnosed or Suspected Recommended Outcome per Protocol: See Provider within 4 hours Reason for Outcome: Systolic blood pressure of more than 180 mmHg OR diastolic blood pressure of more than 120 mmHg Care Advice: ~ Another adult should drive. Call EMS 911 if new symptoms develop, such as severe shortness of breath, chest pain, change in mental status, acute neurologic deficit, seizure, visual disturbances, pulse rate > 120 / minute, or very irregular pulse. ~ ~ HEALTH PROMOTION / MAINTENANCE ~ List, or take, all current prescription(s), nonprescription or alternative medication(s) to provider for evaluation. 01/22/2014 7:45:23PM Page 1 of 1 [email or web address]

## 2014-03-15 ENCOUNTER — Telehealth: Payer: Self-pay | Admitting: Internal Medicine

## 2014-03-15 DIAGNOSIS — I10 Essential (primary) hypertension: Secondary | ICD-10-CM

## 2014-03-15 MED ORDER — HYDRALAZINE HCL 25 MG PO TABS: ORAL_TABLET | ORAL | Status: DC

## 2014-03-15 NOTE — Telephone Encounter (Signed)
Pt called and requesting a refill on her   hydrALAZINE (APRESOLINE) 25 MG tablet [[id number]][[id number]]

## 2014-03-15 NOTE — Telephone Encounter (Signed)
Called pt verified pharmacy inform will send to [facility]...Raechel Chute/lmb

## 2014-03-27 ENCOUNTER — Ambulatory Visit: Payer: 59 | Admitting: Internal Medicine

## 2014-03-27 ENCOUNTER — Telehealth: Payer: Self-pay | Admitting: *Deleted

## 2014-03-27 NOTE — Telephone Encounter (Signed)
Left msg on triage stating she can not afford the benicar that md rx at her last visit. She has use all the samples that was given. Wanting to see will md change med to something else prefer generic...Raechel Chute/lmb

## 2014-03-27 NOTE — Telephone Encounter (Signed)
Notified pt with md response.../lmb 

## 2014-03-27 NOTE — Telephone Encounter (Signed)
She needs OV with all meds, BP readings & 2016 HTN med formulary to determine best option for her

## 2014-04-25 ENCOUNTER — Encounter: Payer: Self-pay | Admitting: Internal Medicine

## 2014-04-25 ENCOUNTER — Ambulatory Visit (INDEPENDENT_AMBULATORY_CARE_PROVIDER_SITE_OTHER): Payer: 59 | Admitting: Internal Medicine

## 2014-04-25 VITALS — BP 150/85 | HR 97 | Temp 98.1°F | Resp 14 | Ht 68.0 in | Wt 214.0 lb

## 2014-04-25 DIAGNOSIS — I10 Essential (primary) hypertension: Secondary | ICD-10-CM

## 2014-04-25 MED ORDER — BENAZEPRIL HCL 20 MG PO TABS: 20.0000 mg | ORAL_TABLET | Freq: Every day | ORAL | Status: DC

## 2014-04-25 NOTE — Progress Notes (Signed)
Pre visit review using our clinic review tool, if applicable. No additional management support is needed unless otherwise documented below in the visit note. 

## 2014-04-25 NOTE — Progress Notes (Signed)
   Subjective:    Patient ID: Cynthia Forbes, female    DOB: 1950-06-04, 64 y.o.   MRN: [id number]  HPI  She stopped the Benicar after the samples were completed; the prescription would've been several $100.  In the last 3 months blood pressures range has been 117-160/85-100.  She also questions whether the Benicar caused some hair loss and fatigue.  She exercises on a bike 15 minutes 3 times a week. She does take stairs @ work. Her FitBit goal has been met. Occasional DOE, otherwise no cardiopulmonary symptoms. .  She does describe some stress at work.  She is on a DASH diet no added salt.  Review of Systems    Chest pain, palpitations, tachycardia,  paroxysmal nocturnal dyspnea, claudication or edema are absent.      Objective:   Physical Exam   Appears healthy and well-nourished & in no acute distress  No carotid bruits are present.No neck vein distention present at 10 - 15 degrees. Thyroid normal to palpation  Heart rhythm and rate are normal with no gallop or murmur  Chest is clear with no increased work of breathing  There is no evidence of aortic aneurysm or renal artery bruits  Abdomen soft but protuberant with no organomegaly or masses. No HJR  No clubbing, cyanosis or edema present.  Pedal pulses are intact   No ischemic skin changes are present . Fingernails/ toenails healthy   Alert and oriented. Strength, tone, DTRs reflexes normal       Assessment & Plan:  #1 HTN  See orders

## 2014-04-25 NOTE — Patient Instructions (Signed)
Minimal Blood Pressure Goal= AVERAGE < 140/90;  Ideal is an AVERAGE < 135/85. This AVERAGE should be calculated from @ least 5-7 BP readings taken @ different times of day on different days of week. You should not respond to isolated BP readings , but rather the AVERAGE for that week .Please bring your  blood pressure cuff to office visits to verify that it is reliable.It  can also be checked against the blood pressure device at the pharmacy. Finger or wrist cuffs are not dependable; an arm cuff is. Report any significant cough or swelling of the lips or tongue with the new blood pressure pill. These are not expected but there are rare occurrences. Stop the medicine immediately should either occur.

## 2014-07-04 ENCOUNTER — Ambulatory Visit (INDEPENDENT_AMBULATORY_CARE_PROVIDER_SITE_OTHER): Payer: 59 | Admitting: Internal Medicine

## 2014-07-04 ENCOUNTER — Encounter: Payer: Self-pay | Admitting: Internal Medicine

## 2014-07-04 VITALS — BP 138/100 | HR 76 | Temp 98.3°F | Ht 68.0 in | Wt 209.2 lb

## 2014-07-04 DIAGNOSIS — H9319 Tinnitus, unspecified ear: Secondary | ICD-10-CM | POA: Diagnosis not present

## 2014-07-04 DIAGNOSIS — H6123 Impacted cerumen, bilateral: Secondary | ICD-10-CM

## 2014-07-04 DIAGNOSIS — R51 Headache: Secondary | ICD-10-CM

## 2014-07-04 DIAGNOSIS — I10 Essential (primary) hypertension: Secondary | ICD-10-CM

## 2014-07-04 DIAGNOSIS — R519 Headache, unspecified: Secondary | ICD-10-CM

## 2014-07-04 MED ORDER — HYDRALAZINE HCL 25 MG PO TABS: ORAL_TABLET | ORAL | Status: DC

## 2014-07-04 NOTE — Patient Instructions (Signed)
   Please increase the Lotensin 20 mg to 2 pills daily and monitor blood pressure.  Please keep a diary of your headaches . Document  each occurrence on the calendar with notation of : #1 any prodrome ( any non headache symptom such as marked fatigue,visual changes, ,etc ) which precedes actual headache ; #2) severity on 1-10 scale; #3) any triggers ( food/ drink,enviromenntal or weather changes ,physical or emotional stress) in 8-12 hour period prior to the headache; & #4) response to any medications or other intervention. Please review "Headache" @ [facility] for additional information.   Please do not use Q-tips as we discussed. Should wax build up occur, please put 2-3 drops of mineral oil in the affected  ear at night to soften the wax .Cover the canal with a  cotton ball to prevent the oil from staining bed linens. In the morning fill the ear canal with hydrogen peroxide & lie in the opposite lateral decubitus position(on the side opposite the affected ear)  for 10-15 minutes. After allowing this period of time for the peroxide to dissolve the wax ;shower and use the thinnest washrag available to wick out the wax. If both ears are involved ; alternate this treatment from ear to ear each night until no wax is found on the washrag.  Go to Web[employer] for information about tinnitus. Avoid excess aspirin and excess noise exposure. "White noise" machine @ bedside can prevent tinnitus from affecting sleep. If symptoms persist or progress; ENT reassessment indicated.  Flonase OR Nasacort AQ 1 spray in each nostril twice a day as needed. Use the "crossover" technique into opposite nostril spraying toward opposite ear @ 45 degree angle, not straight up into nostril.

## 2014-07-04 NOTE — Progress Notes (Signed)
   Subjective:    Patient ID: Cynthia Forbes, female    DOB: 12/14/1950, 64 y.o.   MRN: [id number]  HPI She has several concerns.  Her blood pressure today was 130/90 which was good for her but she is concerned that she's been having intermittent headaches since she's been on Lotensin. She has no extrinsic symptoms or cough. The headache variesy in location. It usually as throbbing when present. She had possible migraines as a child.   She has had tinnitus without associated hearing loss. She describes this as a sensation of "running water". She has seen Dr.Kraus, ENT, who found no significant pathology. He did remove the cerumen impactions.    Review of Systems Extrinsic symptoms of itchy, watery eyes, sneezing, or angioedema are denied. There is no significant cough, sputum production, wheezing,or  paroxysmal nocturnal dyspnea.  Facial pain , nasal purulence, dental pain, sore throat , otic pain or otic discharge denied. No fever , chills or sweats.  Chest pain, palpitations, tachycardia, exertional dyspnea, paroxysmal nocturnal dyspnea, claudication or edema are absent.  Mental status change or memory loss denied. Blurred vision , diplopia or vision loss absent. Vertigo, near syncope or imbalance denied. There is no numbness, tingling, or weakness in extremities.   No loss of control of bladder or bowels. Radicular type pain absent.       Objective:   Physical Exam Pertinent or positive findings include: She has cerumen bilaterally, greater on the left than the right. The second heart sound is split.  She has crepitus in her knees.  There is no cranial nerve deficit.  Neurologic exam is unremarkable.  Appears healthy and well-nourished & in no acute distress No carotid bruits are present.No neck vein distention present at 10 - 15 degrees.  Thyroid normal to palpation Heart rhythm and rate are normal with no gallop or murmur Chest is clear with no increased work of  breathing There is no evidence of aortic aneurysm or renal artery bruits Abdomen soft with no organomegaly or masses. No HJR No clubbing, cyanosis or edema present. Pedal pulses are intact  No ischemic skin changes are present . Fingernails healthy  Alert and oriented. Strength, tone, DTRs reflexes normal        Assessment & Plan:  #1 headaches  #2 hypertension  #3 tinnitus  #4 cerumen  Plan: She'll keep a headache diary. Protocol for wax accumulations discussed.  Lotensin will be increased to 40 mg daily with blood pressure monitoring. If blood pressure control is not achieved or if headaches progress;otions would be changing the metoprolol to carvedilol with titration. A non-vasodilating calcium channel blocker such as verapamil or diltiazem could be considered to treat the hypertension as well as headaches.  Interventions for tinnitus discussed. Possible eustachian tube component will be treated with inhaled nasal steroids.

## 2014-07-04 NOTE — Progress Notes (Signed)
Pre visit review using our clinic review tool, if applicable. No additional management support is needed unless otherwise documented below in the visit note. 

## 2014-09-23 ENCOUNTER — Other Ambulatory Visit: Payer: Self-pay

## 2014-10-16 ENCOUNTER — Other Ambulatory Visit: Payer: Self-pay | Admitting: Obstetrics and Gynecology

## 2014-10-16 LAB — HM PAP SMEAR: HM PAP: NEGATIVE

## 2014-10-17 LAB — CYTOLOGY - PAP

## 2014-10-22 LAB — HM MAMMOGRAPHY

## 2014-11-15 ENCOUNTER — Other Ambulatory Visit: Payer: Self-pay | Admitting: Internal Medicine

## 2015-01-26 IMAGING — CR DG FINGER THUMB 2+V*L*
1 series · 1 of 1 positions shown · non-contrast
Comparison: None.

CLINICAL DATA: Pain for 1 month

LEFT THUMB 2+V

[AP]
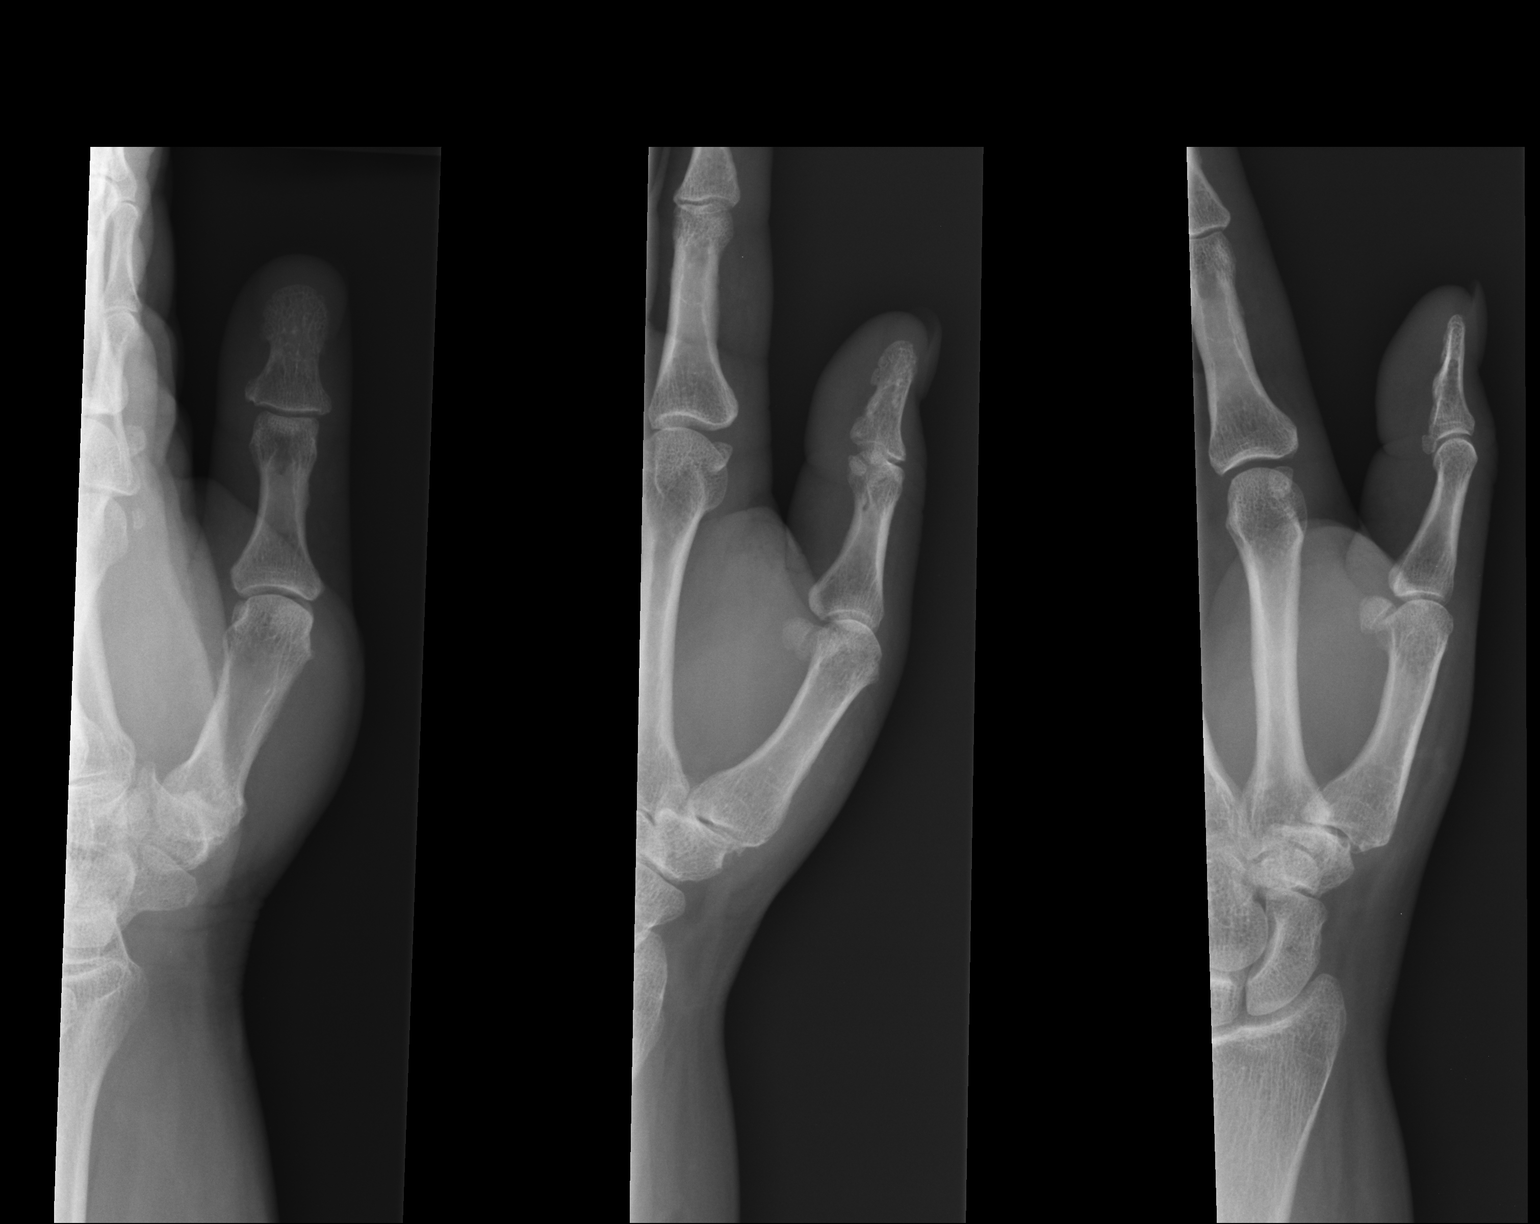

[1 of 1 positions shown; findings below may reference images not displayed]

FINDINGS: Normal alignment of the thumb.  No acute or healing
fracture is identified.  No focal soft tissue swelling appreciated
radiographically.
IMPRESSION: No acute bony abnormality.

Clinically significant discrepancy from primary report, if
provided: None

## 2015-05-03 ENCOUNTER — Encounter: Payer: Self-pay | Admitting: Family Medicine

## 2015-05-03 ENCOUNTER — Ambulatory Visit (INDEPENDENT_AMBULATORY_CARE_PROVIDER_SITE_OTHER): Payer: 59 | Admitting: Family Medicine

## 2015-05-03 VITALS — BP 160/100 | HR 93 | Temp 98.5°F | Ht 68.0 in | Wt 216.0 lb

## 2015-05-03 DIAGNOSIS — R05 Cough: Secondary | ICD-10-CM

## 2015-05-03 DIAGNOSIS — R059 Cough, unspecified: Secondary | ICD-10-CM | POA: Insufficient documentation

## 2015-05-03 MED ORDER — ALBUTEROL SULFATE HFA 108 (90 BASE) MCG/ACT IN AERS: 2.0000 | INHALATION_SPRAY | Freq: Four times a day (QID) | RESPIRATORY_TRACT | Status: DC | PRN

## 2015-05-03 NOTE — Assessment & Plan Note (Signed)
Nontoxic, likely viral, restart SABA and f/u prn.  Likely not flu, based on exam.  No need to test at this point.  Well appearing.

## 2015-05-03 NOTE — Patient Instructions (Addendum)
Restart the albuterol as needed.  That should help the cough.   Get some rest and drink plenty of fluids.  Glad to see you.  Take care.

## 2015-05-03 NOTE — Progress Notes (Signed)
Pre visit review using our clinic review tool, if applicable. No additional management support is needed unless otherwise documented below in the visit note.  Off pravastatin and benazepril, will defer to PCP.   Out of SABA.   BP usually controlled better at home, higher at OV.    Sx started about 4-5 days ago.  In the meantime, more cough.   Felt tired and diffusely weak.  Insomnia, not sleeping well.  More cough in the last 24 hours.  Hasn't been using SABA.  Some episodic sputum production.  Hoarse now, likely from coughing.  No FCNAVD.  No rash.  No ear pain. No facial pain.  Cough is the most bothersome sx for the patient.    She had a flu shot this fall.    Meds, vitals, and allergies reviewed.   ROS: See HPI.  Otherwise, noncontributory.  GEN: nad, alert and oriented HEENT: mucous membranes moist, tm w/o erythema, nasal exam w/o erythema, scant amount of scabbed blood on medial L nostril, clear discharge noted,  OP with minimal cobblestoning NECK: supple w/o LA CV: rrr.   PULM: ctab, no inc wob EXT: no edema

## 2015-06-20 ENCOUNTER — Other Ambulatory Visit: Payer: Self-pay | Admitting: Internal Medicine

## 2015-09-25 ENCOUNTER — Other Ambulatory Visit (INDEPENDENT_AMBULATORY_CARE_PROVIDER_SITE_OTHER): Payer: 59

## 2015-09-25 ENCOUNTER — Ambulatory Visit (INDEPENDENT_AMBULATORY_CARE_PROVIDER_SITE_OTHER): Payer: 59 | Admitting: Internal Medicine

## 2015-09-25 ENCOUNTER — Encounter: Payer: Self-pay | Admitting: Internal Medicine

## 2015-09-25 VITALS — BP 184/126 | HR 70 | Temp 98.9°F | Resp 16 | Ht 68.75 in | Wt 218.0 lb

## 2015-09-25 DIAGNOSIS — E785 Hyperlipidemia, unspecified: Secondary | ICD-10-CM | POA: Diagnosis not present

## 2015-09-25 DIAGNOSIS — E1165 Type 2 diabetes mellitus with hyperglycemia: Secondary | ICD-10-CM

## 2015-09-25 DIAGNOSIS — I10 Essential (primary) hypertension: Secondary | ICD-10-CM

## 2015-09-25 DIAGNOSIS — IMO0001 Reserved for inherently not codable concepts without codable children: Secondary | ICD-10-CM

## 2015-09-25 LAB — LIPID PANEL
CHOL/HDL RATIO: 4
Cholesterol: 202 mg/dL — ABNORMAL HIGH (ref 0–200)
HDL: 45.1 mg/dL (ref >39.00)
LDL Cholesterol: 135 mg/dL — ABNORMAL HIGH (ref 0–99)
NONHDL: 156.72
Triglycerides: 107 mg/dL (ref 0.0–149.0)
VLDL: 21.4 mg/dL (ref 0.0–40.0)

## 2015-09-25 LAB — HEMOGLOBIN A1C: HEMOGLOBIN A1C: 6.6 % — AB (ref 4.6–6.5)

## 2015-09-25 LAB — COMPREHENSIVE METABOLIC PANEL
ALT: 13 U/L (ref 0–35)
AST: 15 U/L (ref 0–37)
Albumin: 4.2 g/dL (ref 3.5–5.2)
Alkaline Phosphatase: 82 U/L (ref 39–117)
BUN: 10 mg/dL (ref 6–23)
CHLORIDE: 107 meq/L (ref 96–112)
CO2: 29 mEq/L (ref 19–32)
Calcium: 9.6 mg/dL (ref 8.4–10.5)
Creatinine, Ser: 0.94 mg/dL (ref 0.40–1.20)
GFR: 77.34 mL/min (ref >60.00)
GLUCOSE: 104 mg/dL — AB (ref 70–99)
POTASSIUM: 3.9 meq/L (ref 3.5–5.1)
SODIUM: 141 meq/L (ref 135–145)
Total Bilirubin: 0.5 mg/dL (ref 0.2–1.2)
Total Protein: 8.2 g/dL (ref 6.0–8.3)

## 2015-09-25 LAB — MICROALBUMIN / CREATININE URINE RATIO
Creatinine,U: 347.7 mg/dL
Microalb Creat Ratio: 1.4 mg/g (ref 0.0–30.0)
Microalb, Ur: 4.9 mg/dL — ABNORMAL HIGH (ref 0.0–1.9)

## 2015-09-25 LAB — CBC WITH DIFFERENTIAL/PLATELET
BASOS PCT: 0.3 % (ref 0.0–3.0)
Basophils Absolute: 0 10*3/uL (ref 0.0–0.1)
EOS PCT: 0.8 % (ref 0.0–5.0)
Eosinophils Absolute: 0.1 10*3/uL (ref 0.0–0.7)
HCT: 40.8 % (ref 36.0–46.0)
Hemoglobin: 13.2 g/dL (ref 12.0–15.0)
LYMPHS ABS: 2.8 10*3/uL (ref 0.7–4.0)
Lymphocytes Relative: 39.3 % (ref 12.0–46.0)
MCHC: 32.4 g/dL (ref 30.0–36.0)
MCV: 79.7 fl (ref 78.0–100.0)
MONO ABS: 0.4 10*3/uL (ref 0.1–1.0)
Monocytes Relative: 6.1 % (ref 3.0–12.0)
NEUTROS PCT: 53.5 % (ref 43.0–77.0)
Neutro Abs: 3.8 10*3/uL (ref 1.4–7.7)
Platelets: 378 10*3/uL (ref 150.0–400.0)
RBC: 5.12 Mil/uL — ABNORMAL HIGH (ref 3.87–5.11)
RDW: 15.6 % — AB (ref 11.5–15.5)
WBC: 7.1 10*3/uL (ref 4.0–10.5)

## 2015-09-25 LAB — TSH: TSH: 1.05 u[IU]/mL (ref 0.35–4.50)

## 2015-09-25 MED ORDER — BENAZEPRIL HCL 20 MG PO TABS: 20.0000 mg | ORAL_TABLET | Freq: Every day | ORAL | Status: DC

## 2015-09-25 MED ORDER — PRAVASTATIN SODIUM 40 MG PO TABS: 40.0000 mg | ORAL_TABLET | Freq: Every day | ORAL | Status: DC

## 2015-09-25 MED ORDER — METOPROLOL SUCCINATE ER 100 MG PO TB24: 100.0000 mg | ORAL_TABLET | Freq: Every day | ORAL | Status: DC

## 2015-09-25 NOTE — Assessment & Plan Note (Signed)
Not taking pravastatin - will restart Check lipids today

## 2015-09-25 NOTE — Progress Notes (Signed)
Pre visit review using our clinic review tool, if applicable. No additional management support is needed unless otherwise documented below in the visit note. 

## 2015-09-25 NOTE — Assessment & Plan Note (Addendum)
Not controlled Taking metoprolol and hydralazine - but does sometimes miss the middle dose of hydralazine Not taking benazepril - will restart Continue to monitor at home Cmp, tsh, basic labs

## 2015-09-25 NOTE — Patient Instructions (Addendum)
  Test(s) ordered today. Your results will be released to MyChart (or called to you) after review, usually within 72hours after test completion. If any changes need to be made, you will be notified at that same time.  Medications reviewed and updated.  Changes include restarting the pravastatin.  We will also restart the benazepril and change the metoprolol once a day.   Your prescription(s) have been submitted to your pharmacy. Please take as directed and contact our office if you believe you are having problem(s) with the medication(s).  Please followup in 3 months.

## 2015-09-25 NOTE — Progress Notes (Signed)
Subjective:    Patient ID: Cynthia FreibergShelah D Porto, female    DOB: [birth year], 65 y.o.   MRN: [id number]  HPI She is here to establish with a new pcp.  She is here for follow up.  Hypertension: She has been told she has white coat hypertension.  She is taking her medication daily, but has not been taking the benazepril - "it was not renewed". She is fairly compliant with a low sodium diet.  She denies chest pain, palpitations, edema, shortness of breath and regular headaches. She is not exercising regularly.  She does monitor her blood pressure at work - 136/85, 115/70, and more recently 136-140/85-95.  She is concerned her medication is not working any more and may need it to be changed.    Diabetes: She is controlling her sugars with lifestyle. She is fairly compliant with a diabetic diet. She is not exercising regularly. She checks her feet daily and denies foot lesions. She is up-to-date with an ophthalmology examination.   Hyperlipidemia: She is not taking her medication daily - she says it was not renewed by Dr Alwyn RenHopper. She is compliant with a low fat/cholesterol diet. She is not exercising regularly.     Medications and allergies reviewed with patient and updated if appropriate.  Patient Active Problem List   Diagnosis Date Noted  . Cough 05/03/2015  . 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 Diabetes type 2, uncontrolled (HCC) 08/20/2008  . Hyperlipidemia 06/13/2007  . NONSPECIFIC ABNORMAL ELECTROCARDIOGRAM 06/13/2007  . Essential hypertension 07/04/2006  . MITRAL VALVE PROLAPSE, HX OF 07/04/2006    Current Outpatient Prescriptions on File Prior to Visit  Medication Sig Dispense Refill  . albuterol (PROAIR HFA) 108 (90 Base) MCG/ACT inhaler Inhale 2 puffs into the lungs every 6 (six) hours as needed for wheezing or shortness of breath. 18 g 0  . aspirin 81 MG chewable tablet Chew 81 mg by mouth daily.    . hydrALAZINE (APRESOLINE) 25 MG tablet TAKE TWO TABLETS BY MOUTH ONCE DAILY IN THE  MORNING 1  TABLET  BY  MOUTH  MID  DAY  AND  2  DAILY  IN  EVENING 150 tablet 3  . metoprolol (LOPRESSOR) 100 MG tablet Take 100 mg by mouth 2 (two) times daily.    . benazepril (LOTENSIN) 20 MG tablet Take 1 tablet (20 mg total) by mouth daily. (Patient not taking: Reported on 05/03/2015) 30 tablet 2  . pravastatin (PRAVACHOL) 40 MG tablet Take 40 mg by mouth at bedtime. Reported on 09/25/2015     No current facility-administered medications on file prior to visit.    Past Medical History  Diagnosis Date  . Hypertension   . Bronchitis   . Kidney stones 2005 & 08/2011     passed spontaneously X 2  . Hyperlipemia   . Diabetes mellitus without complication [facility](HCC)     Past Surgical History  Procedure Laterality Date  . Tonsillectomy    . Abdominal hysterectomy  10/05/11    Dr Jennette KettleNeal  . Oophorectomy  10/05/11    degenerative fibroid; Dr Jennette KettleNeal  . Vaginal bleed  10/19/11    hematoma; Dr Marcelle OverlieHolland    Social History   Social History  . Marital Status: Divorced    Spouse Name: N/A  . Number of Children: N/A  . Years of Education: N/A   Social History Main Topics  . Smoking status: Never Smoker   . Smokeless tobacco: Not on file  . Alcohol Use: No  .  Drug Use: No  . Sexual Activity: Not Currently   Other Topics Concern  . Not on file   Social History Narrative    No family history on file.  Review of Systems  Constitutional: Negative for fever.  Respiratory: Negative for cough, shortness of breath and wheezing.   Cardiovascular: Positive for palpitations (rare). Negative for chest pain and leg swelling.  Neurological: Positive for headaches (on computer all day). Negative for dizziness and light-headedness.       Objective:   Filed Vitals:   09/25/15 0913  BP: 184/126  Pulse: 70  Temp: 98.9 F (37.2 C)  Resp: 16   Filed Weights   09/25/15 0913  Weight: 218 lb (98.884 kg)   Body mass index is 32.44 kg/(m^2).   Physical Exam Constitutional: Appears well-developed and  well-nourished. No distress.  Neck: Neck supple. No tracheal deviation present. No thyromegaly present.  No carotid bruit. No cervical adenopathy.   Cardiovascular: Normal rate, regular rhythm and normal heart sounds.   No murmur heard.  No edema Pulmonary/Chest: Effort normal and breath sounds normal. No respiratory distress. No wheezes.         Assessment & Plan:    See Problem List for Assessment and Plan of chronic medical problems.  F/u in 3 months, sooner if BP is not controlled

## 2015-09-25 NOTE — Assessment & Plan Note (Addendum)
Diet controlled Check a1c, urine microalbumin, cmp Stressed regularly exercise and diabetic diet

## 2015-09-27 ENCOUNTER — Encounter: Payer: Self-pay | Admitting: Internal Medicine

## 2015-09-29 ENCOUNTER — Telehealth: Payer: Self-pay

## 2015-09-29 NOTE — Telephone Encounter (Signed)
metoprolol succinate (TOPROL-XL) 100 MG 24 hr tablet [[id number]][[id number]]   Patient called and said this medication cost to much. Could you call in the original she was on. Please follow up, thank you.

## 2015-10-01 MED ORDER — METOPROLOL TARTRATE 100 MG PO TABS: 100.0000 mg | ORAL_TABLET | Freq: Two times a day (BID) | ORAL | Status: DC

## 2015-10-01 NOTE — Telephone Encounter (Signed)
Please advise 

## 2015-10-01 NOTE — Telephone Encounter (Signed)
Lopressor sent to pof

## 2015-10-28 ENCOUNTER — Encounter: Payer: Self-pay | Admitting: Internal Medicine

## 2015-10-28 ENCOUNTER — Telehealth: Payer: Self-pay | Admitting: Internal Medicine

## 2015-10-28 ENCOUNTER — Ambulatory Visit (INDEPENDENT_AMBULATORY_CARE_PROVIDER_SITE_OTHER): Payer: 59 | Admitting: Internal Medicine

## 2015-10-28 DIAGNOSIS — J069 Acute upper respiratory infection, unspecified: Secondary | ICD-10-CM

## 2015-10-28 MED ORDER — HYDROCODONE-HOMATROPINE 5-1.5 MG/5ML PO SYRP: 5.0000 mL | ORAL_SOLUTION | Freq: Three times a day (TID) | ORAL | 0 refills | Status: DC | PRN

## 2015-10-28 MED ORDER — FLUTICASONE PROPIONATE 50 MCG/ACT NA SUSP: 2.0000 | Freq: Every day | NASAL | 6 refills | Status: DC

## 2015-10-28 MED ORDER — BENZONATATE 100 MG PO CAPS: 100.0000 mg | ORAL_CAPSULE | Freq: Three times a day (TID) | ORAL | 0 refills | Status: DC | PRN

## 2015-10-28 NOTE — Telephone Encounter (Signed)
[facility] - Client TELEPHONE ADVICE RECORD [facility]  Patient Name: [facility]  DOB: 03-13-1951    Initial Comment Caller states has strep throat and hard to swallow. She had a fever of 101-102 on Saturday.   Nurse Assessment  Nurse: Anner Crete, RN, Massie Bougie Date/Time (Eastern Time): 10/28/2015 8:07:03 AM  Confirm and document reason for call. If symptomatic, describe symptoms. You must click the next button to save text entered. ---Caller states that she had a fever on Friday. She think that she has strep throat and is having difficulty swallowing. She no longer has a fever.  Has the patient traveled out of the country within the last 30 days? ---Not Applicable  Does the patient have any new or worsening symptoms? ---Yes  Will a triage be completed? ---Yes  Related visit to physician within the last 2 weeks? ---No  Does the PT have any chronic conditions? (i.e. diabetes, asthma, etc.) ---Yes  List chronic conditions. ---HTN  Is this a behavioral health or substance abuse call? ---No     Guidelines    Guideline Title Affirmed Question Affirmed Notes  Sore Throat SEVERE (e.g., excruciating) throat pain    Final Disposition User   See Physician within 24 Hours Wells, RN, Massie Bougie    Comments  NOTE NO AVAILABLE APPT WITH BURNS, MD Given appt today 10-28-2015 345pm with Hillard Danker, MD at [facility] office for c/o sore throat fever present since Friday  NOTE Triage done by Suzy Bouchard, RN Appt obtained by Bryson Corona, RN BSN and documented into Epic at this time.   Referrals  REFERRED TO PCP OFFICE   Disagree/Comply: Comply

## 2015-10-28 NOTE — Progress Notes (Signed)
Pre visit review using our clinic review tool, if applicable. No additional management support is needed unless otherwise documented below in the visit note. 

## 2015-10-28 NOTE — Progress Notes (Signed)
   Subjective:    Patient ID: Cynthia Forbes, female    DOB: 09-07-1950, 65 y.o.   MRN: [id number]  HPI The patient is a 65 YO female coming in for sore throat. Going on about 3 days. Had a fever the first 2 days or so. She is coughing a mild amount. Some nasal drainage and ear pain and pressure. Denies sinus pressure. Has not tried anything for it. Overall the sore throat is worsening but the cough is mildly better. She does not take allergy medicine.   Review of Systems  Constitutional: Positive for chills and fever. Negative for activity change, appetite change and unexpected weight change.  HENT: Positive for congestion, ear pain, postnasal drip, rhinorrhea and sore throat. Negative for ear discharge, sinus pressure and trouble swallowing.   Eyes: Negative.   Respiratory: Positive for cough. Negative for chest tightness, shortness of breath and wheezing.   Cardiovascular: Negative for chest pain, palpitations and leg swelling.  Gastrointestinal: Negative.       Objective:   Physical Exam  Constitutional: She is oriented to person, place, and time. She appears well-developed and well-nourished.  HENT:  Head: Normocephalic and atraumatic.  Bilateral TM normal, oropharynx with redness and clear drainage, no sinus tenderness  Eyes: EOM are normal.  Neck: Normal range of motion. No JVD present.  Cardiovascular: Normal rate and regular rhythm.   Pulmonary/Chest: Effort normal and breath sounds normal. No respiratory distress. She has no wheezes. She has no rales. She exhibits no tenderness.  Abdominal: Soft.  Lymphadenopathy:    She has no cervical adenopathy.  Neurological: She is alert and oriented to person, place, and time.  Skin: Skin is warm and dry.   Vitals:   10/28/15 1549  BP: (!) 152/100  Pulse: 90  Resp: 12  Temp: 99.1 F (37.3 C)  TempSrc: Oral  SpO2: 97%  Weight: 217 lb (98.4 kg)  Height: 5' 8.5" (1.74 m)      Assessment & Plan:

## 2015-10-28 NOTE — Patient Instructions (Addendum)
We have given you a prescription for cough syrup that you can use as needed for the cough. It can make you slightly sleepy so do not take before driving.   We have also sent in flonase which you use 2 sprays in each nostril once a day. This can take 2-3 days to kick in.   You are not contagious once you are fever free for 24 hours.    Upper Respiratory Infection, Adult Most upper respiratory infections (URIs) are a viral infection of the air passages leading to the lungs. A URI affects the nose, throat, and upper air passages. The most common type of URI is nasopharyngitis and is typically referred to as "the common cold." URIs run their course and usually go away on their own. Most of the time, a URI does not require medical attention, but sometimes a bacterial infection in the upper airways can follow a viral infection. This is called a secondary infection. Sinus and middle ear infections are common types of secondary upper respiratory infections. Bacterial pneumonia can also complicate a URI. A URI can worsen asthma and chronic obstructive pulmonary disease (COPD). Sometimes, these complications can require emergency medical care and may be life threatening.  CAUSES Almost all URIs are caused by viruses. A virus is a type of germ and can spread from one person to another.  RISKS FACTORS You may be at risk for a URI if:   You smoke.   You have chronic heart or lung disease.  You have a weakened defense (immune) system.   You are very young or very old.   You have nasal allergies or asthma.  You work in crowded or poorly ventilated areas.  You work in health care facilities or schools. SIGNS AND SYMPTOMS  Symptoms typically develop 2-3 days after you come in contact with a cold virus. Most viral URIs last 7-10 days. However, viral URIs from the influenza virus (flu virus) can last 14-18 days and are typically more severe. Symptoms may include:   Runny or stuffy (congested) nose.    Sneezing.   Cough.   Sore throat.   Headache.   Fatigue.   Fever.   Loss of appetite.   Pain in your forehead, behind your eyes, and over your cheekbones (sinus pain).  Muscle aches.  DIAGNOSIS  Your health care provider may diagnose a URI by:  Physical exam.  Tests to check that your symptoms are not due to another condition such as:  Strep throat.  Sinusitis.  Pneumonia.  Asthma. TREATMENT  A URI goes away on its own with time. It cannot be cured with medicines, but medicines may be prescribed or recommended to relieve symptoms. Medicines may help:  Reduce your fever.  Reduce your cough.  Relieve nasal congestion. HOME CARE INSTRUCTIONS   Take medicines only as directed by your health care provider.   Gargle warm saltwater or take cough drops to comfort your throat as directed by your health care provider.  Use a warm mist humidifier or inhale steam from a shower to increase air moisture. This may make it easier to breathe.  Drink enough fluid to keep your urine clear or pale yellow.   Eat soups and other clear broths and maintain good nutrition.   Rest as needed.   Return to work when your temperature has returned to normal or as your health care provider advises. You may need to stay home longer to avoid infecting others. You can also use a face mask  and careful hand washing to prevent spread of the virus.  Increase the usage of your inhaler if you have asthma.   Do not use any tobacco products, including cigarettes, chewing tobacco, or electronic cigarettes. If you need help quitting, ask your health care provider. PREVENTION  The best way to protect yourself from getting a cold is to practice good hygiene.   Avoid oral or hand contact with people with cold symptoms.   Wash your hands often if contact occurs.  There is no clear evidence that vitamin C, vitamin E, echinacea, or exercise reduces the chance of developing a cold.  However, it is always recommended to get plenty of rest, exercise, and practice good nutrition.  SEEK MEDICAL CARE IF:   You are getting worse rather than better.   Your symptoms are not controlled by medicine.   You have chills.  You have worsening shortness of breath.  You have brown or red mucus.  You have yellow or brown nasal discharge.  You have pain in your face, especially when you bend forward.  You have a fever.  You have swollen neck glands.  You have pain while swallowing.  You have white areas in the back of your throat. SEEK IMMEDIATE MEDICAL CARE IF:   You have severe or persistent:  Headache.  Ear pain.  Sinus pain.  Chest pain.  You have chronic lung disease and any of the following:  Wheezing.  Prolonged cough.  Coughing up blood.  A change in your usual mucus.  You have a stiff neck.  You have changes in your:  Vision.  Hearing.  Thinking.  Mood. MAKE SURE YOU:   Understand these instructions.  Will watch your condition.  Will get help right away if you are not doing well or get worse.   This information is not intended to replace advice given to you by your health care provider. Make sure you discuss any questions you have with your health care provider.   Document Released: 09/08/2000 Document Revised: 07/30/2014 Document Reviewed: 06/20/2013 Elsevier Interactive Patient Education [employer]

## 2015-10-29 ENCOUNTER — Telehealth: Payer: Self-pay | Admitting: Internal Medicine

## 2015-10-29 DIAGNOSIS — J069 Acute upper respiratory infection, unspecified: Secondary | ICD-10-CM | POA: Insufficient documentation

## 2015-10-29 NOTE — Telephone Encounter (Signed)
Pt called stating [facility] on [address] on back order for 2 med that Dr. Okey Dupre sent in yesterday, she was wondering if we can sent it [facility] on [address] or other pharmacy that may have in stock. Please call her back, she can not go back to work until she take some medicine.

## 2015-10-29 NOTE — Assessment & Plan Note (Signed)
Rx for flonase and for tessalon perles. Talked to her about expected course, lack of indication for antibiotics at this time.

## 2015-10-30 ENCOUNTER — Other Ambulatory Visit: Payer: Self-pay | Admitting: Geriatric Medicine

## 2015-10-30 NOTE — Telephone Encounter (Signed)
Patient aware. Placed in cabinet up front.  

## 2015-10-30 NOTE — Telephone Encounter (Signed)
Printed and signed for pickup.  

## 2015-10-30 NOTE — Telephone Encounter (Signed)
Called and spoke to patient and she got her medicine. She said she is going to need another letter to cover her to stay out of work until Monday.

## 2015-11-03 ENCOUNTER — Encounter: Payer: Self-pay | Admitting: Family Medicine

## 2015-11-03 ENCOUNTER — Ambulatory Visit (INDEPENDENT_AMBULATORY_CARE_PROVIDER_SITE_OTHER): Payer: 59 | Admitting: Family Medicine

## 2015-11-03 ENCOUNTER — Encounter: Payer: Self-pay | Admitting: *Deleted

## 2015-11-03 ENCOUNTER — Telehealth: Payer: Self-pay | Admitting: Internal Medicine

## 2015-11-03 VITALS — BP 142/98 | HR 89 | Temp 98.6°F | Ht 68.5 in | Wt 217.3 lb

## 2015-11-03 DIAGNOSIS — J0100 Acute maxillary sinusitis, unspecified: Secondary | ICD-10-CM | POA: Diagnosis not present

## 2015-11-03 MED ORDER — AMOXICILLIN-POT CLAVULANATE 875-125 MG PO TABS: 1.0000 | ORAL_TABLET | Freq: Two times a day (BID) | ORAL | 0 refills | Status: DC

## 2015-11-03 NOTE — Patient Instructions (Signed)
BEFORE YOU LEAVE: -work note seen today, may return tomorrow  Start the antibiotic today and take according to instructions.  Follow up with your doctor if symptoms worsen or do not resolve with treatment.

## 2015-11-03 NOTE — Telephone Encounter (Signed)
Patient states she has gotten worse since she has seen Dr. Okey Duprerawford on 8/1.  States Dr. Okey Duprerawford did not prescribe her any medication.  Patient states she woke up this morning and had one of her eyes matted shut.  Patient states she is at work but work would like to know if patient is contagious.  Patient states her throat is still sore.  States that she had a bad weekend.  Patient states she can not afford to come back in for another OV.  Patient states she also had a sharp pain in eye and her eyes started tearing yesterday.  She states eye is red today.  States the infection in all on the left side in throat, ear and eye.

## 2015-11-03 NOTE — Progress Notes (Signed)
Pre visit review using our clinic review tool, if applicable. No additional management support is needed unless otherwise documented below in the visit note. 

## 2015-11-03 NOTE — Telephone Encounter (Signed)
Please advise in Dr. Crawford's absence, thanks.  

## 2015-11-03 NOTE — Progress Notes (Signed)
HPI:  Acute visit for:  URI: -started 2 weeks ago -initially fever, sore throat, nasal congestion, cough -fever resolved but persistent nasal congestion, now thick and discolored, sinus pressure, PND, cough -denies: persistent fevers, SOB, NVD, abx allergies  ROS: See pertinent positives and negatives per HPI.  Past Medical History:  Diagnosis Date  . Bronchitis   . Diabetes mellitus without complication (HCC)   . Hyperlipemia   . Hypertension   . Kidney stones 2005 & 08/2011    passed spontaneously X 2    Past Surgical History:  Procedure Laterality Date  . ABDOMINAL HYSTERECTOMY  10/05/11   Dr Jennette KettleNeal  . OOPHORECTOMY  10/05/11   degenerative fibroid; Dr Jennette KettleNeal  . TONSILLECTOMY    . vaginal bleed  10/19/11   hematoma; Dr Marcelle OverlieHolland    No family history on file.  Social History   Social History  . Marital status: Divorced    Spouse name: N/A  . Number of children: N/A  . Years of education: N/A   Social History Main Topics  . Smoking status: Never Smoker  . Smokeless tobacco: None  . Alcohol use No  . Drug use: No  . Sexual activity: Not Currently   Other Topics Concern  . None   Social History Narrative  . None     Current Outpatient Prescriptions:  .  albuterol (PROAIR HFA) 108 (90 Base) MCG/ACT inhaler, Inhale 2 puffs into the lungs every 6 (six) hours as needed for wheezing or shortness of breath., Disp: 18 g, Rfl: 0 .  aspirin 81 MG chewable tablet, Chew 81 mg by mouth daily., Disp: , Rfl:  .  benazepril (LOTENSIN) 20 MG tablet, Take 1 tablet (20 mg total) by mouth daily., Disp: 90 tablet, Rfl: 3 .  benzonatate (TESSALON) 100 MG capsule, Take 1 capsule (100 mg total) by mouth 3 (three) times daily as needed for cough., Disp: 60 capsule, Rfl: 0 .  fluticasone (FLONASE) 50 MCG/ACT nasal spray, Place 2 sprays into both nostrils daily., Disp: 16 g, Rfl: 6 .  hydrALAZINE (APRESOLINE) 25 MG tablet, TAKE TWO TABLETS BY MOUTH ONCE DAILY IN THE MORNING 1  TABLET  BY   MOUTH  MID  DAY  AND  2  DAILY  IN  EVENING, Disp: 150 tablet, Rfl: 3 .  metoprolol (LOPRESSOR) 100 MG tablet, Take 1 tablet (100 mg total) by mouth 2 (two) times daily., Disp: 180 tablet, Rfl: 3 .  pravastatin (PRAVACHOL) 40 MG tablet, Take 1 tablet (40 mg total) by mouth at bedtime. Reported on 09/25/2015, Disp: 90 tablet, Rfl: 3 .  amoxicillin-clavulanate (AUGMENTIN) 875-125 MG tablet, Take 1 tablet by mouth 2 (two) times daily., Disp: 20 tablet, Rfl: 0  EXAM:  Vitals:   11/03/15 1619  BP: (!) 142/98  Pulse: 89  Temp: 98.6 F (37 C)    Body mass index is 32.56 kg/m.  GENERAL: vitals reviewed and listed above, alert, oriented, appears well hydrated and in no acute distress  HEENT: atraumatic, conjunttiva clear, no obvious abnormalities on inspection of external nose and ears, normal appearance of ear canals and TMs, thick nasal congestion, mild post oropharyngeal erythema with PND, no tonsillar edema or exudate, no sinus TTP  NECK: no obvious masses on inspection, ant cervical LAD  LUNGS: clear to auscultation bilaterally, no wheezes, rales or rhonchi, good air movement  CV: HRRR, no peripheral edema  MS: moves all extremities without noticeable abnormality  PSYCH: pleasant and cooperative, no obvious depression or anxiety  ASSESSMENT  AND PLAN:  Discussed the following assessment and plan:  Acute maxillary sinusitis, recurrence not specified  -given duration of symptoms and thick discolored nasal congestion opted for tx with abx for likely sinusitis -no fevers or severe symptoms, work note for today - may return tomorrow -advised BP check not coughing prior to leaving and close follow up with PCP if remains elevated -Patient advised to return or notify a doctor immediately if symptoms worsen or persist or new concerns arise.  Patient Instructions  BEFORE YOU LEAVE: -work note seen today, may return tomorrow  Start the antibiotic today and take according to  instructions.  Follow up with your doctor if symptoms worsen or do not resolve with treatment.   Cynthia Basque R., DO

## 2015-12-06 IMAGING — CR DG CHEST 2V
2 series · 2 of 2 positions shown · non-contrast
Comparison: None.

CLINICAL DATA: Cough, fever

EXAM:
CHEST  2 VIEW

[view not recorded (1 of 2)]
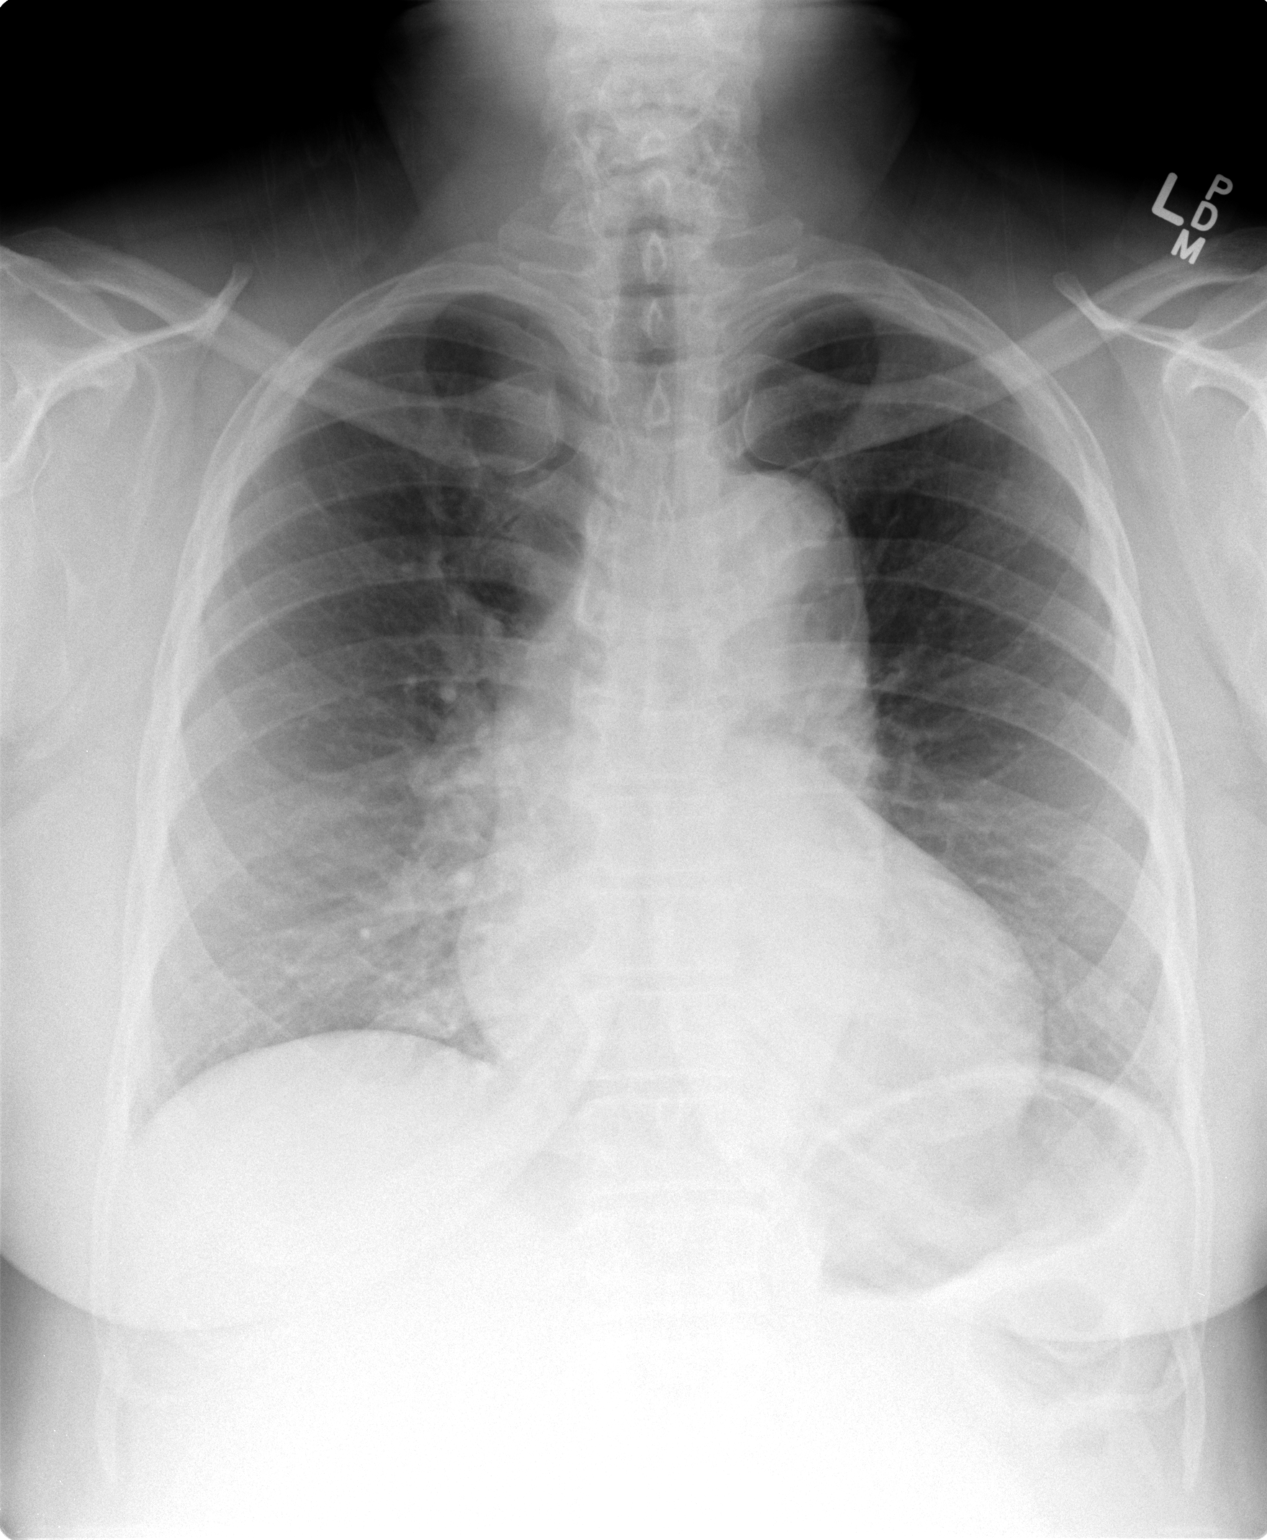

[view not recorded (2 of 2)]
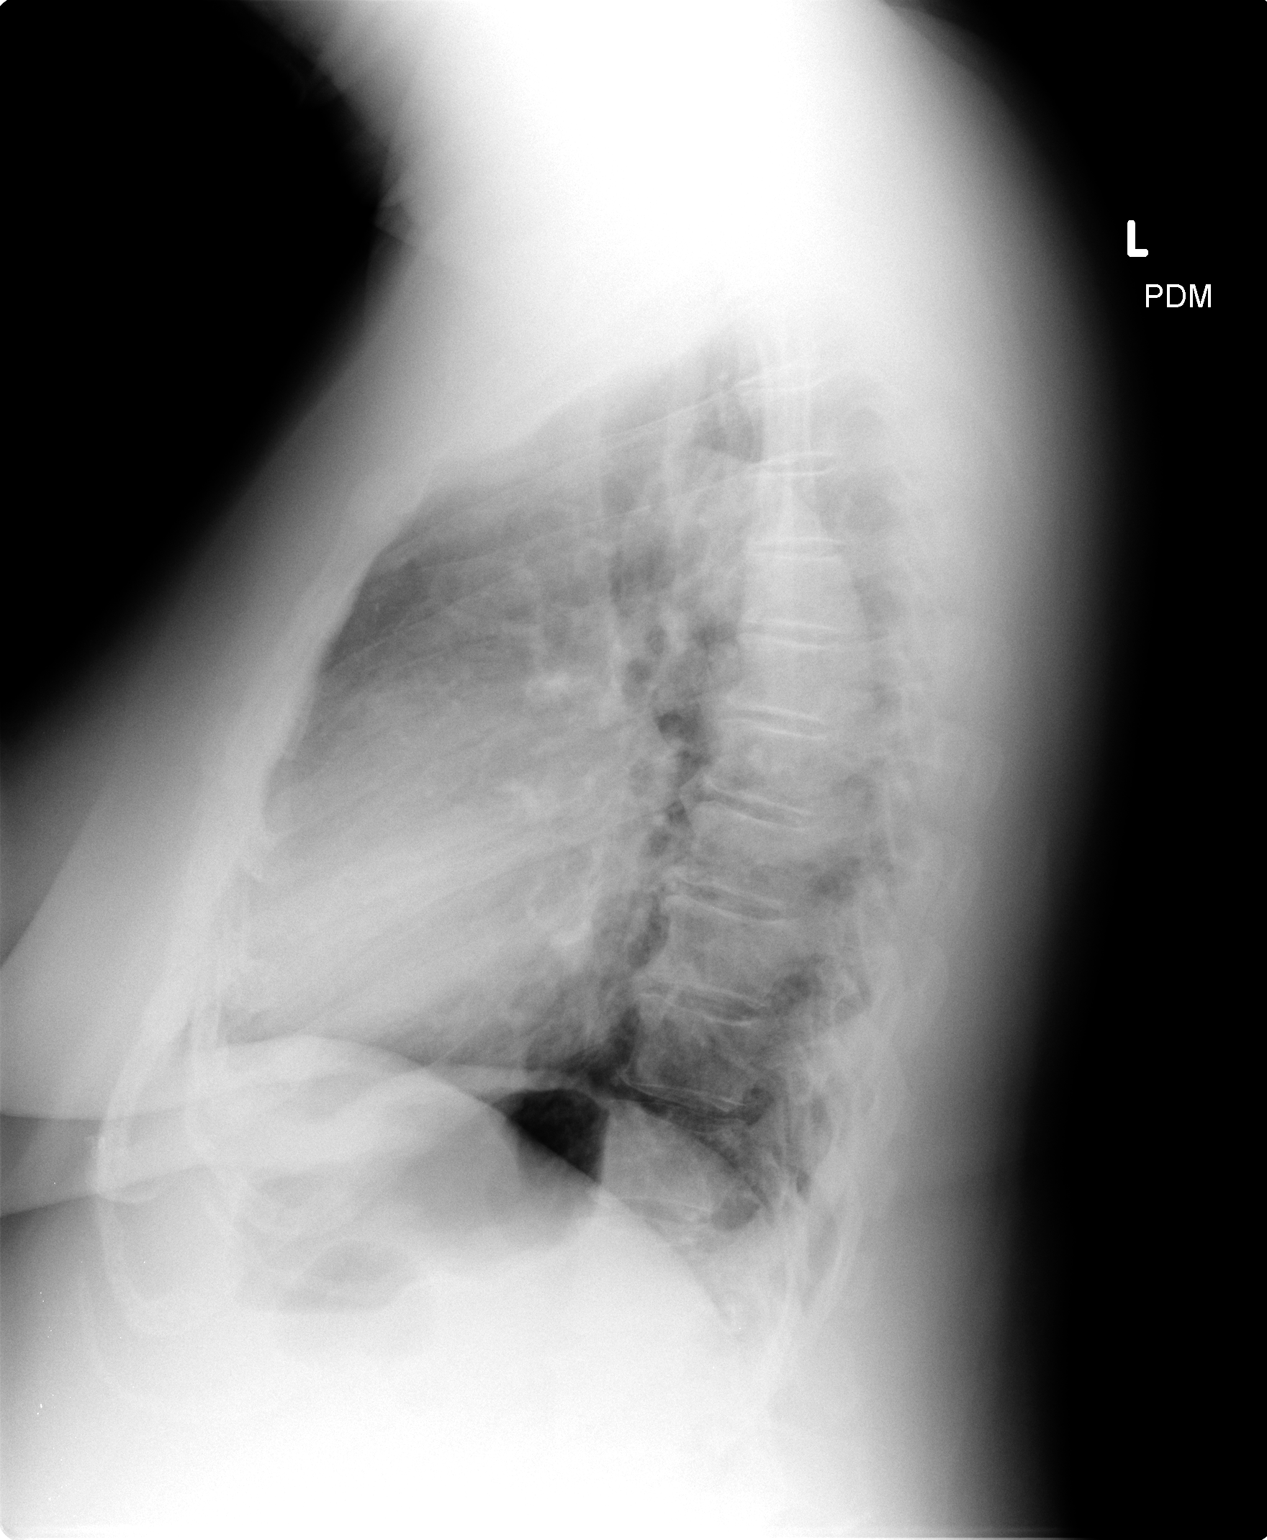

[2 of 2 positions shown; findings below may reference images not displayed]

FINDINGS: The heart size and mediastinal contours are within normal limits.
Both lungs are clear. The visualized skeletal structures are
unremarkable.
IMPRESSION: No active cardiopulmonary disease.

## 2016-02-24 ENCOUNTER — Other Ambulatory Visit: Payer: Self-pay | Admitting: Internal Medicine

## 2016-05-15 ENCOUNTER — Other Ambulatory Visit: Payer: Self-pay | Admitting: Internal Medicine

## 2016-05-17 MED ORDER — HYDRALAZINE HCL 25 MG PO TABS: ORAL_TABLET | ORAL | 0 refills | Status: DC

## 2016-06-08 ENCOUNTER — Ambulatory Visit (INDEPENDENT_AMBULATORY_CARE_PROVIDER_SITE_OTHER): Payer: 59 | Admitting: Internal Medicine

## 2016-06-08 ENCOUNTER — Other Ambulatory Visit (INDEPENDENT_AMBULATORY_CARE_PROVIDER_SITE_OTHER): Payer: 59

## 2016-06-08 ENCOUNTER — Encounter: Payer: Self-pay | Admitting: Internal Medicine

## 2016-06-08 ENCOUNTER — Telehealth: Payer: Self-pay | Admitting: Internal Medicine

## 2016-06-08 VITALS — BP 172/90 | HR 69 | Temp 98.5°F | Resp 16 | Ht 69.0 in | Wt 220.0 lb

## 2016-06-08 DIAGNOSIS — Z1159 Encounter for screening for other viral diseases: Secondary | ICD-10-CM | POA: Diagnosis not present

## 2016-06-08 DIAGNOSIS — E119 Type 2 diabetes mellitus without complications: Secondary | ICD-10-CM

## 2016-06-08 DIAGNOSIS — I10 Essential (primary) hypertension: Secondary | ICD-10-CM

## 2016-06-08 DIAGNOSIS — Z0000 Encounter for general adult medical examination without abnormal findings: Secondary | ICD-10-CM

## 2016-06-08 DIAGNOSIS — E7800 Pure hypercholesterolemia, unspecified: Secondary | ICD-10-CM | POA: Diagnosis not present

## 2016-06-08 DIAGNOSIS — Z114 Encounter for screening for human immunodeficiency virus [HIV]: Secondary | ICD-10-CM

## 2016-06-08 DIAGNOSIS — R9431 Abnormal electrocardiogram [ECG] [EKG]: Secondary | ICD-10-CM

## 2016-06-08 LAB — MICROALBUMIN / CREATININE URINE RATIO
CREATININE, U: 197.5 mg/dL
MICROALB/CREAT RATIO: 2.4 mg/g (ref 0.0–30.0)
Microalb, Ur: 4.8 mg/dL — ABNORMAL HIGH (ref 0.0–1.9)

## 2016-06-08 LAB — COMPREHENSIVE METABOLIC PANEL
ALBUMIN: 4.3 g/dL (ref 3.5–5.2)
ALK PHOS: 77 U/L (ref 39–117)
ALT: 13 U/L (ref 0–35)
AST: 15 U/L (ref 0–37)
BILIRUBIN TOTAL: 0.4 mg/dL (ref 0.2–1.2)
BUN: 12 mg/dL (ref 6–23)
CALCIUM: 9.8 mg/dL (ref 8.4–10.5)
CO2: 29 mEq/L (ref 19–32)
Chloride: 105 mEq/L (ref 96–112)
Creatinine, Ser: 0.92 mg/dL (ref 0.40–1.20)
GFR: 79.1 mL/min (ref >60.00)
Glucose, Bld: 86 mg/dL (ref 70–99)
Potassium: 3.8 mEq/L (ref 3.5–5.1)
Sodium: 142 mEq/L (ref 135–145)
TOTAL PROTEIN: 8.1 g/dL (ref 6.0–8.3)

## 2016-06-08 LAB — CBC WITH DIFFERENTIAL/PLATELET
BASOS ABS: 0.1 10*3/uL (ref 0.0–0.1)
Basophils Relative: 0.7 % (ref 0.0–3.0)
Eosinophils Absolute: 0.1 10*3/uL (ref 0.0–0.7)
Eosinophils Relative: 0.9 % (ref 0.0–5.0)
HEMATOCRIT: 41.2 % (ref 36.0–46.0)
Hemoglobin: 12.9 g/dL (ref 12.0–15.0)
LYMPHS PCT: 39.6 % (ref 12.0–46.0)
Lymphs Abs: 3.1 10*3/uL (ref 0.7–4.0)
MCHC: 31.3 g/dL (ref 30.0–36.0)
MCV: 81 fl (ref 78.0–100.0)
MONOS PCT: 5.4 % (ref 3.0–12.0)
Monocytes Absolute: 0.4 10*3/uL (ref 0.1–1.0)
Neutro Abs: 4.2 10*3/uL (ref 1.4–7.7)
Neutrophils Relative %: 53.4 % (ref 43.0–77.0)
Platelets: 331 10*3/uL (ref 150.0–400.0)
RBC: 5.08 Mil/uL (ref 3.87–5.11)
RDW: 15.8 % — ABNORMAL HIGH (ref 11.5–15.5)
WBC: 7.8 10*3/uL (ref 4.0–10.5)

## 2016-06-08 LAB — LIPID PANEL
CHOLESTEROL: 198 mg/dL (ref 0–200)
HDL: 47.9 mg/dL (ref >39.00)
LDL Cholesterol: 131 mg/dL — ABNORMAL HIGH (ref 0–99)
NONHDL: 150.53
TRIGLYCERIDES: 96 mg/dL (ref 0.0–149.0)
Total CHOL/HDL Ratio: 4
VLDL: 19.2 mg/dL (ref 0.0–40.0)

## 2016-06-08 LAB — TSH: TSH: 1.27 u[IU]/mL (ref 0.35–4.50)

## 2016-06-08 LAB — HEMOGLOBIN A1C: HEMOGLOBIN A1C: 6.9 % — AB (ref 4.6–6.5)

## 2016-06-08 LAB — VITAMIN B12: VITAMIN B 12: 292 pg/mL (ref 211–911)

## 2016-06-08 LAB — VITAMIN D 25 HYDROXY (VIT D DEFICIENCY, FRACTURES): VITD: 8.6 ng/mL — ABNORMAL LOW (ref 30.00–100.00)

## 2016-06-08 MED ORDER — METOPROLOL TARTRATE 100 MG PO TABS: 100.0000 mg | ORAL_TABLET | Freq: Two times a day (BID) | ORAL | 3 refills | Status: DC

## 2016-06-08 NOTE — Telephone Encounter (Signed)
Her EKG does not look that different than her prior EKG's  - there is a slight abnormality which may be normal for her, but give that she has hypertension and diabetes she is at an increased risk of heart disease.  I would like her to have a stress test if she is ok with that.  Can she walk on a treadmill?

## 2016-06-08 NOTE — Assessment & Plan Note (Addendum)
Diet controlled Check a1c Low sugar / carb diet Stressed regular exercise, weight loss  

## 2016-06-08 NOTE — Progress Notes (Signed)
Subjective:    Patient ID: Cynthia Forbes, female    DOB: [birth year], 66 y.o.   MRN: [id number]  HPI She is here for a physical exam.   Hypertension:  She did not tolerate the benazepril.  She had heart racing, sob ad she stopped it.  She checks her BP 126/78 - 200's/ ?  It depends on her stress level.    Diabetes:  She is fairly compliant with diabetic diet. She is not currently exercising, but plans on starting to walk at work.   She takes pravastatin, but not daily. She just forgets.   Medications and allergies reviewed with patient and updated if appropriate.  Patient Active Problem List   Diagnosis Date Noted  . URI (upper respiratory infection) 10/29/2015  . 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 Diabetes type 2, uncontrolled (HCC) 08/20/2008  . Hyperlipidemia 06/13/2007  . NONSPECIFIC ABNORMAL ELECTROCARDIOGRAM 06/13/2007  . Essential hypertension 07/04/2006  . MITRAL VALVE PROLAPSE, HX OF 07/04/2006    Current Outpatient Prescriptions on File Prior to Visit  Medication Sig Dispense Refill  . albuterol (PROAIR HFA) 108 (90 Base) MCG/ACT inhaler Inhale 2 puffs into the lungs every 6 (six) hours as needed for wheezing or shortness of breath. 18 g 0  . aspirin 81 MG chewable tablet Chew 81 mg by mouth daily.    . fluticasone (FLONASE) 50 MCG/ACT nasal spray Place 2 sprays into both nostrils daily. 16 g 6  . hydrALAZINE (APRESOLINE) 25 MG tablet TAKE 2 TABLETS PO IN THE MORNING, ONE TABLET PO AT MIDDAY AND TWO TABLETS PO IN THE EVENING --- Office visit needed for further refills 150 tablet 0  . metoprolol (LOPRESSOR) 100 MG tablet Take 1 tablet (100 mg total) by mouth 2 (two) times daily. 180 tablet 3  . pravastatin (PRAVACHOL) 40 MG tablet Take 1 tablet (40 mg total) by mouth at bedtime. Reported on 09/25/2015 (Patient not taking: Reported on 06/08/2016) 90 tablet 3   No current facility-administered medications on file prior to visit.     Past Medical History:  Diagnosis  Date  . Bronchitis   . Diabetes mellitus without complication (HCC)   . Hyperlipemia   . Hypertension   . Kidney stones 2005 & 08/2011    passed spontaneously X 2    Past Surgical History:  Procedure Laterality Date  . ABDOMINAL HYSTERECTOMY  10/05/11   Dr Jennette Kettle  . OOPHORECTOMY  10/05/11   degenerative fibroid; Dr Jennette Kettle  . TONSILLECTOMY    . vaginal bleed  10/19/11   hematoma; Dr Marcelle Overlie    Social History   Social History  . Marital status: Divorced    Spouse name: N/A  . Number of children: N/A  . Years of education: N/A   Social History Main Topics  . Smoking status: Never Smoker  . Smokeless tobacco: Not on file  . Alcohol use No  . Drug use: No  . Sexual activity: Not Currently   Other Topics Concern  . Not on file   Social History Narrative  . No narrative on file    No family history on file.  Review of Systems  Constitutional: Negative for chills and fever.  Eyes: Negative for visual disturbance.  Respiratory: Negative for cough, shortness of breath and wheezing.   Cardiovascular: Negative for chest pain, palpitations and leg swelling.  Gastrointestinal: Negative for abdominal pain, blood in stool, constipation and diarrhea.  Genitourinary: Negative for dysuria and hematuria.  Musculoskeletal: Positive for arthralgias (mild,  arthritis). Negative for back pain.  Skin: Negative for rash.  Neurological: Negative for light-headedness and headaches.  Psychiatric/Behavioral: Negative for dysphoric mood. The patient is nervous/anxious.        Objective:   Vitals:   06/08/16 1518  BP: (!) 172/90  Pulse: 69  Resp: 16  Temp: 98.5 F (36.9 C)   Filed Weights   06/08/16 1518  Weight: 220 lb (99.8 kg)   Body mass index is 32.49 kg/m.  Wt Readings from Last 3 Encounters:  06/08/16 220 lb (99.8 kg)  11/03/15 217 lb 4.8 oz (98.6 kg)  10/28/15 217 lb (98.4 kg)     Physical Exam Constitutional: She appears well-developed and well-nourished. No distress.    HENT:  Head: Normocephalic and atraumatic.  Right Ear: External ear normal. Normal ear canal and TM Left Ear: External ear normal.  Normal ear canal and TM Mouth/Throat: Oropharynx is clear and moist.  Eyes: Conjunctivae and EOM are normal.  Neck: Neck supple. No tracheal deviation present. No thyromegaly present.  No carotid bruit  Cardiovascular: Normal rate, regular rhythm and normal heart sounds.   No murmur heard.  No edema. Pulmonary/Chest: Effort normal and breath sounds normal. No respiratory distress. She has no wheezes. She has no rales.  Breast: deferred to Gyn Abdominal: Soft. She exhibits no distension. There is no tenderness.  Lymphadenopathy: She has no cervical adenopathy.  Skin: Skin is warm and dry. She is not diaphoretic.  Psychiatric: She has a normal mood and affect. Her behavior is normal.         Assessment & Plan:   Physical exam: Screening blood work ordered Immunizations-  - deferred pneumovax today Colonoscopy  - never had one - deferred today Mammogram - will schedule  Gyn - will schedule Eye exams - Up to date  EKG     Today shows sinus rhythm, RSR, left axis and non-specific T wave abnormality in anterior-lateral leads -- there was no significant change compared to EKG from 2015. No stress testing on file.  Exercise -  none - encouraged regular exercise Weight  - encouarged weight loss Skin no concerns Substance abuse   none  See Problem List for Assessment and Plan of chronic medical problems.  FU in 6 months

## 2016-06-08 NOTE — Patient Instructions (Addendum)
Test(s) ordered today. Your results will be released to [place] (or called to you) after review, usually within 72hours after test completion. If any changes need to be made, you will be notified at that same time.  All other Health Maintenance issues reviewed.   All recommended immunizations and age-appropriate screenings are up-to-date or discussed.  No immunizations administered today.   Medications reviewed and updated.  No changes recommended at this time.  Monitor your BP at home. The goal is less than 130/80.  You should have a colonoscopy, mammogram, pap smear.   Please followup in 6 months   Health Maintenance, Female Adopting a healthy lifestyle and getting preventive care can go a long way to promote health and wellness. Talk with your health care provider about what schedule of regular examinations is right for you. This is a good chance for you to check in with your provider about disease prevention and staying healthy. In between checkups, there are plenty of things you can do on your own. Experts have done a lot of research about which lifestyle changes and preventive measures are most likely to keep you healthy. Ask your health care provider for more information. Weight and diet Eat a healthy diet  Be sure to include plenty of vegetables, fruits, low-fat dairy products, and lean protein.  Do not eat a lot of foods high in solid fats, added sugars, or salt.  Get regular exercise. This is one of the most important things you can do for your health.  Most adults should exercise for at least 150 minutes each week. The exercise should increase your heart rate and make you sweat (moderate-intensity exercise).  Most adults should also do strengthening exercises at least twice a week. This is in addition to the moderate-intensity exercise. Maintain a healthy weight  Body mass index (BMI) is a measurement that can be used to identify possible weight problems. It estimates  body fat based on height and weight. Your health care provider can help determine your BMI and help you achieve or maintain a healthy weight.  For females 60 years of age and older:  A BMI below 18.5 is considered underweight.  A BMI of 18.5 to 24.9 is normal.  A BMI of 25 to 29.9 is considered overweight.  A BMI of 30 and above is considered obese. Watch levels of cholesterol and blood lipids  You should start having your blood tested for lipids and cholesterol at 64 years of age, then have this test every 5 years.  You may need to have your cholesterol levels checked more often if:  Your lipid or cholesterol levels are high.  You are older than 64 years of age.  You are at high risk for heart disease. Cancer screening Lung Cancer  Lung cancer screening is recommended for adults 5-83 years old who are at high risk for lung cancer because of a history of smoking.  A yearly low-dose CT scan of the lungs is recommended for people who:  Currently smoke.  Have quit within the past 15 years.  Have at least a 30-pack-year history of smoking. A pack year is smoking an average of one pack of cigarettes a day for 1 year.  Yearly screening should continue until it has been 15 years since you quit.  Yearly screening should stop if you develop a health problem that would prevent you from having lung cancer treatment. Breast Cancer  Practice breast self-awareness. This means understanding how your breasts normally appear and feel.  It also means doing regular breast self-exams. Let your health care provider know about any changes, no matter how small.  If you are in your 20s or 30s, you should have a clinical breast exam (CBE) by a health care provider every 1-3 years as part of a regular health exam.  If you are 41 or older, have a CBE every year. Also consider having a breast X-ray (mammogram) every year.  If you have a family history of breast cancer, talk to your health care  provider about genetic screening.  If you are at high risk for breast cancer, talk to your health care provider about having an MRI and a mammogram every year.  Breast cancer gene (BRCA) assessment is recommended for women who have family members with BRCA-related cancers. BRCA-related cancers include:  Breast.  Ovarian.  Tubal.  Peritoneal cancers.  Results of the assessment will determine the need for genetic counseling and BRCA1 and BRCA2 testing. Cervical Cancer  Your health care provider may recommend that you be screened regularly for cancer of the pelvic organs (ovaries, uterus, and vagina). This screening involves a pelvic examination, including checking for microscopic changes to the surface of your cervix (Pap test). You may be encouraged to have this screening done every 3 years, beginning at age 65.  For women ages 19-65, health care providers may recommend pelvic exams and Pap testing every 3 years, or they may recommend the Pap and pelvic exam, combined with testing for human papilloma virus (HPV), every 5 years. Some types of HPV increase your risk of cervical cancer. Testing for HPV may also be done on women of any age with unclear Pap test results.  Other health care providers may not recommend any screening for nonpregnant women who are considered low risk for pelvic cancer and who do not have symptoms. Ask your health care provider if a screening pelvic exam is right for you.  If you have had past treatment for cervical cancer or a condition that could lead to cancer, you need Pap tests and screening for cancer for at least 20 years after your treatment. If Pap tests have been discontinued, your risk factors (such as having a new sexual partner) need to be reassessed to determine if screening should resume. Some women have medical problems that increase the chance of getting cervical cancer. In these cases, your health care provider may recommend more frequent screening and  Pap tests. Colorectal Cancer  This type of cancer can be detected and often prevented.  Routine colorectal cancer screening usually begins at 64 years of age and continues through 64 years of age.  Your health care provider may recommend screening at an earlier age if you have risk factors for colon cancer.  Your health care provider may also recommend using home test kits to check for hidden blood in the stool.  A small camera at the end of a tube can be used to examine your colon directly (sigmoidoscopy or colonoscopy). This is done to check for the earliest forms of colorectal cancer.  Routine screening usually begins at age 61.  Direct examination of the colon should be repeated every 5-10 years through 64 years of age. However, you may need to be screened more often if early forms of precancerous polyps or small growths are found. Skin Cancer  Check your skin from head to toe regularly.  Tell your health care provider about any new moles or changes in moles, especially if there is a change in  a mole's shape or color.  Also tell your health care provider if you have a mole that is larger than the size of a pencil eraser.  Always use sunscreen. Apply sunscreen liberally and repeatedly throughout the day.  Protect yourself by wearing long sleeves, pants, a wide-brimmed hat, and sunglasses whenever you are outside. Heart disease, diabetes, and high blood pressure  High blood pressure causes heart disease and increases the risk of stroke. High blood pressure is more likely to develop in:  People who have blood pressure in the high end of the normal range (130-139/85-89 mm Hg).  People who are overweight or obese.  People who are African American.  If you are 80-13 years of age, have your blood pressure checked every 3-5 years. If you are 78 years of age or older, have your blood pressure checked every year. You should have your blood pressure measured twice-once when you are at a  hospital or clinic, and once when you are not at a hospital or clinic. Record the average of the two measurements. To check your blood pressure when you are not at a hospital or clinic, you can use:  An automated blood pressure machine at a pharmacy.  A home blood pressure monitor.  If you are between 66 years and 8 years old, ask your health care provider if you should take aspirin to prevent strokes.  Have regular diabetes screenings. This involves taking a blood sample to check your fasting blood sugar level.  If you are at a normal weight and have a low risk for diabetes, have this test once every three years after 64 years of age.  If you are overweight and have a high risk for diabetes, consider being tested at a younger age or more often. Preventing infection Hepatitis B  If you have a higher risk for hepatitis B, you should be screened for this virus. You are considered at high risk for hepatitis B if:  You were born in a country where hepatitis B is common. Ask your health care provider which countries are considered high risk.  Your parents were born in a high-risk country, and you have not been immunized against hepatitis B (hepatitis B vaccine).  You have HIV or AIDS.  You use needles to inject street drugs.  You live with someone who has hepatitis B.  You have had sex with someone who has hepatitis B.  You get hemodialysis treatment.  You take certain medicines for conditions, including cancer, organ transplantation, and autoimmune conditions. Hepatitis C  Blood testing is recommended for:  Everyone born from [birth year] through 1963.  Anyone with known risk factors for hepatitis C. Sexually transmitted infections (STIs)  You should be screened for sexually transmitted infections (STIs) including gonorrhea and chlamydia if:  You are sexually active and are younger than 64 years of age.  You are older than 64 years of age and your health care provider tells you  that you are at risk for this type of infection.  Your sexual activity has changed since you were last screened and you are at an increased risk for chlamydia or gonorrhea. Ask your health care provider if you are at risk.  If you do not have HIV, but are at risk, it may be recommended that you take a prescription medicine daily to prevent HIV infection. This is called pre-exposure prophylaxis (PrEP). You are considered at risk if:  You are sexually active and do not regularly use condoms or know the  HIV status of your partner(s).  You take drugs by injection.  You are sexually active with a partner who has HIV. Talk with your health care provider about whether you are at high risk of being infected with HIV. If you choose to begin PrEP, you should first be tested for HIV. You should then be tested every 3 months for as long as you are taking PrEP. Pregnancy  If you are premenopausal and you may become pregnant, ask your health care provider about preconception counseling.  If you may become pregnant, take 400 to 800 micrograms (mcg) of folic acid every day.  If you want to prevent pregnancy, talk to your health care provider about birth control (contraception). Osteoporosis and menopause  Osteoporosis is a disease in which the bones lose minerals and strength with aging. This can result in serious bone fractures. Your risk for osteoporosis can be identified using a bone density scan.  If you are 18 years of age or older, or if you are at risk for osteoporosis and fractures, ask your health care provider if you should be screened.  Ask your health care provider whether you should take a calcium or vitamin D supplement to lower your risk for osteoporosis.  Menopause may have certain physical symptoms and risks.  Hormone replacement therapy may reduce some of these symptoms and risks. Talk to your health care provider about whether hormone replacement therapy is right for you. Follow  these instructions at home:  Schedule regular health, dental, and eye exams.  Stay current with your immunizations.  Do not use any tobacco products including cigarettes, chewing tobacco, or electronic cigarettes.  If you are pregnant, do not drink alcohol.  If you are breastfeeding, limit how much and how often you drink alcohol.  Limit alcohol intake to no more than 1 drink per day for nonpregnant women. One drink equals 12 ounces of beer, 5 ounces of wine, or 1 ounces of hard liquor.  Do not use street drugs.  Do not share needles.  Ask your health care provider for help if you need support or information about quitting drugs.  Tell your health care provider if you often feel depressed.  Tell your health care provider if you have ever been abused or do not feel safe at home. This information is not intended to replace advice given to you by your health care provider. Make sure you discuss any questions you have with your health care provider. Document Released: 09/28/2010 Document Revised: 08/21/2015 Document Reviewed: 12/17/2014 Elsevier Interactive Patient Education  2017 [employer].

## 2016-06-08 NOTE — Progress Notes (Signed)
Pre visit review using our clinic review tool, if applicable. No additional management support is needed unless otherwise documented below in the visit note. 

## 2016-06-08 NOTE — Assessment & Plan Note (Addendum)
Not controlled here - white coat htn She feels it may be controlled She will monitor regularly - stressed concern over BP not being controlled Discussed goal bp cmp

## 2016-06-08 NOTE — Assessment & Plan Note (Addendum)
Check lipid panel  Continue statin - not taking daily -start daily Regular exercise and healthy diet encouraged

## 2016-06-09 ENCOUNTER — Other Ambulatory Visit: Payer: Self-pay | Admitting: Internal Medicine

## 2016-06-09 ENCOUNTER — Encounter: Payer: Self-pay | Admitting: Internal Medicine

## 2016-06-09 DIAGNOSIS — E559 Vitamin D deficiency, unspecified: Secondary | ICD-10-CM

## 2016-06-09 DIAGNOSIS — E538 Deficiency of other specified B group vitamins: Secondary | ICD-10-CM

## 2016-06-09 LAB — HIV ANTIBODY (ROUTINE TESTING W REFLEX): HIV: NONREACTIVE

## 2016-06-09 LAB — HEPATITIS C ANTIBODY: HCV AB: NEGATIVE

## 2016-06-09 MED ORDER — VITAMIN D (ERGOCALCIFEROL) 1.25 MG (50000 UNIT) PO CAPS: 50000.0000 [IU] | ORAL_CAPSULE | ORAL | 0 refills | Status: DC

## 2016-06-09 NOTE — Telephone Encounter (Signed)
Patient states that the last stress test she did a few year ago was "bad" not the results but "bad for her" she states that they had to stop the stress test. Is there something else that can be done to further evaluate? Please advise.

## 2016-06-09 NOTE — Telephone Encounter (Signed)
There are other options - I will refer her to cardiology and they can consider a different type of stress test.

## 2016-06-10 NOTE — Telephone Encounter (Signed)
Ok,noted

## 2016-06-10 NOTE — Telephone Encounter (Signed)
Spoke with pt, due to financial reasons she would like to try to hold off on further testing. She states she is going to work on diet and exercise and monitoring her BP at home.

## 2016-06-25 ENCOUNTER — Ambulatory Visit (INDEPENDENT_AMBULATORY_CARE_PROVIDER_SITE_OTHER): Payer: 59 | Admitting: Cardiovascular Disease

## 2016-06-25 ENCOUNTER — Encounter: Payer: Self-pay | Admitting: Cardiovascular Disease

## 2016-06-25 VITALS — BP 172/114 | HR 71 | Ht 68.0 in | Wt 220.0 lb

## 2016-06-25 DIAGNOSIS — I119 Hypertensive heart disease without heart failure: Secondary | ICD-10-CM

## 2016-06-25 DIAGNOSIS — E7800 Pure hypercholesterolemia, unspecified: Secondary | ICD-10-CM

## 2016-06-25 DIAGNOSIS — E6609 Other obesity due to excess calories: Secondary | ICD-10-CM | POA: Diagnosis not present

## 2016-06-25 DIAGNOSIS — R9431 Abnormal electrocardiogram [ECG] [EKG]: Secondary | ICD-10-CM | POA: Diagnosis not present

## 2016-06-25 MED ORDER — CARVEDILOL 25 MG PO TABS: 25.0000 mg | ORAL_TABLET | Freq: Two times a day (BID) | ORAL | 5 refills | Status: DC

## 2016-06-25 NOTE — Patient Instructions (Signed)
Medication Instructions:  STOP METOPROLOL   START CARVEDILOL 25 MG TWICE A DAY   Labwork: NONE  Testing/Procedures: NONE  Follow-Up: Your physician recommends that you schedule a follow-up appointment in: 4/24 THE OFFICE WILL CALL YOU WITH A TIME   Any Other Special Instructions Will Be Listed Below (If Applicable). KEEP A LOG OF YOUR BLOOD PRESSURES AND BRING TO YOUR FOLLOW UP APPOINTMENT   If you need a refill on your cardiac medications before your next appointment, please call your pharmacy.

## 2016-06-25 NOTE — Progress Notes (Signed)
Cardiology Office Note   Date:  06/25/2016   ID:  Cynthia Forbes, Cynthia Forbes 10-12-1950, MRN [id number]  PCP:  Pincus Sanes, MD  Cardiologist:   Chilton Si, MD   No chief complaint on file.     History of Present Illness: Cynthia Forbes is a 66 y.o. female hypertension, hyperlipidemia, diabetes, and mitral valve prolapse who is being seen today for the evaluation of an abnormal EKG at the request of Pincus Sanes, MD.  Cynthia Forbes saw Dr. Lawerance Bach on 06/08/16 for routine follow up and her EKG showed anterolateral T wave inversions.  Dr. Lawerance Bach recommended stress testing, but Cynthia Forbes requested to avoid stress testing.  She was referred to cardiology for further evaluation.  Cynthia Forbes has been feeling well.  Her main complaint is stress at work and fatigue.  Since starting vitamin D supplementation her energy has improved.  She also reports having a lot of stress at work. She hasn't been exercising very much but her office just started a new exercise regimen of walking 2 miles on Wednesdays. She also just received a standing desk. She notes tightness in her shoulders when stressed but it gets better with stretching. She never has chest pain or shortness of breath. She also denies lower extremity edema, orthopnea, or PND. She checks her blood pressure at home and it typically runs in the 120s to 140s over 80s to 110s. She is very affected by stress and anxiety. She has white coat hypertension and her blood pressure is always elevated when she sees the doctor. She reports that her EKG has always been abnormal.  Past Medical History:  Diagnosis Date  . Bronchitis   . Diabetes mellitus without complication (HCC)   . Hyperlipemia   . Hypertension   . Kidney stones 2005 & 08/2011    passed spontaneously X 2    Past Surgical History:  Procedure Laterality Date  . ABDOMINAL HYSTERECTOMY  10/05/11   Dr Jennette Kettle  . OOPHORECTOMY  10/05/11   degenerative fibroid; Dr Jennette Kettle  . TONSILLECTOMY    . vaginal bleed   10/19/11   hematoma; Dr Marcelle Overlie     Current Outpatient Prescriptions  Medication Sig Dispense Refill  . albuterol (PROAIR HFA) 108 (90 Base) MCG/ACT inhaler Inhale 2 puffs into the lungs every 6 (six) hours as needed for wheezing or shortness of breath. 18 g 0  . aspirin 81 MG chewable tablet Chew 81 mg by mouth daily.    . fluticasone (FLONASE) 50 MCG/ACT nasal spray Place 2 sprays into both nostrils daily as needed for allergies or rhinitis.    . hydrALAZINE (APRESOLINE) 25 MG tablet TAKE 2 TABLETS PO IN THE MORNING, ONE TABLET PO AT MIDDAY AND TWO TABLETS PO IN THE EVENING --- Office visit needed for further refills 150 tablet 0  . pravastatin (PRAVACHOL) 40 MG tablet Take 1 tablet (40 mg total) by mouth at bedtime. Reported on 09/25/2015 90 tablet 3  . Vitamin D, Ergocalciferol, (DRISDOL) 50000 units CAPS capsule Take 1 capsule (50,000 Units total) by mouth every 7 (seven) days. 12 capsule 0  . carvedilol (COREG) 25 MG tablet Take 1 tablet (25 mg total) by mouth 2 (two) times daily. 60 tablet 5   No current facility-administered medications for this visit.     Allergies:   Codeine; Ivp dye [iodinated diagnostic agents]; Benazepril; Benicar [olmesartan]; Clonidine derivatives; and Strawberry extract    Social History:  The patient  reports that she has never  smoked. She has never used smokeless tobacco. She reports that she does not drink alcohol or use drugs.   Family History:  The patient's family history includes Alzheimer's disease in her mother; Cancer in her brother and mother; Diabetes in her father; Kidney disease in her father; Liver cancer in her maternal grandfather; Prostate cancer in her father.    ROS:  Please see the history of present illness.   Otherwise, review of systems are positive for none.   All other systems are reviewed and negative.    PHYSICAL EXAM: VS:  BP (!) 172/114 (BP Location: Left Arm)   Pulse 71   Ht  (1.727 m)   Wt 99.8 kg (220 lb)   BMI  33.45 kg/m  , BMI Body mass index is 33.45 kg/m. GENERAL:  Well appearing HEENT:  Pupils equal round and reactive, fundi not visualized, oral mucosa unremarkable NECK:  No jugular venous distention, waveform within normal limits, carotid upstroke brisk and symmetric, no bruits, no thyromegaly LYMPHATICS:  No cervical adenopathy LUNGS:  Clear to auscultation bilaterally HEART:  RRR.  PMI not displaced or sustained,S1 and S2 within normal limits, no S3, no S4, no clicks, no rubs, no murmurs ABD:  Flat, positive bowel sounds normal in frequency in pitch, no bruits, no rebound, no guarding, no midline pulsatile mass, no hepatomegaly, no splenomegaly EXT:  2 plus pulses throughout, no edema, no cyanosis no clubbing SKIN:  No rashes no nodules NEURO:  Cranial nerves II through XII grossly intact, motor grossly intact throughout PSYCH:  Cognitively intact, oriented to person place and time    EKG:  EKG is not ordered today. The ekg ordered 06/08/16 demonstrates sinus rhythm. Rate 68 bpm. Left anterior fascicular block. Anterolateral T wave inversions. 01/23/14:  Sinus rhythm. Rate 98 bpm. early R wave transition. Left anterior fascicular block. Anterolateral T wave inversions.  Recent Labs: 06/08/2016: ALT 13; BUN 12; Creatinine, Ser 0.92; Hemoglobin 12.9; Platelets 331.0; Potassium 3.8; Sodium 142; TSH 1.27    Lipid Panel    Component Value Date/Time   CHOL 198 06/08/2016 1615   TRIG 96.0 06/08/2016 1615   HDL 47.90 06/08/2016 1615   CHOLHDL 4 06/08/2016 1615   VLDL 19.2 06/08/2016 1615   LDLCALC 131 (H) 06/08/2016 1615   LDLDIRECT 176.7 01/11/2013 0821      Wt Readings from Last 3 Encounters:  06/25/16 99.8 kg (220 lb)  06/08/16 99.8 kg (220 lb)  11/03/15 98.6 kg (217 lb 4.8 oz)      ASSESSMENT AND PLAN:  # Abnormal EKG:  Cynthia Forbes' EKG shows anterolateral T-wave inversions. This is unchanged from her previous EKGs in 2015) prior. She has no symptoms of ischemia. Therefore we  will not pursue stress testing at this time. I did advise her that as she starts increasing her exercise regimen, if she has any issues with chest pain or shortness of breath, we would have a low threshold for stress testing.   # Hypertension: Blood pressure was elevated here both initially and on repeat.  There is certainly a component of white coat hypertension. however, her blood pressure is also elevated at home. We will stop metoprolol and switch to carvedilol 25 mg twice daily.  Continue hydralazine.  # Hyperlipidemia: Continue pravastatin. She admits to not taking it regularly.  # Obesity: We discussed the importance of weight loss and exercise. Recommended that she exercise at least 90-150 minutes weekly.    Current medicines are reviewed at length with the patient  today.  The patient does not have concerns regarding medicines.  The following changes have been made:  Switch metoprolol to carvedilol   Labs/ tests ordered today include:  No orders of the defined types were placed in this encounter.    Disposition:   FU with Ciena Sampley C. Duke Salvia, MD, [facility] in 1 month.   This note was written with the assistance of speech recognition software.  Please excuse any transcriptional errors.  Signed, Noelani Harbach C. Duke Salvia, MD, [facility]  06/25/2016 5:24 PM    [facility]

## 2016-06-29 ENCOUNTER — Other Ambulatory Visit: Payer: Self-pay | Admitting: Internal Medicine

## 2016-07-21 ENCOUNTER — Ambulatory Visit (INDEPENDENT_AMBULATORY_CARE_PROVIDER_SITE_OTHER): Payer: 59 | Admitting: Cardiovascular Disease

## 2016-07-21 ENCOUNTER — Encounter: Payer: Self-pay | Admitting: Cardiovascular Disease

## 2016-07-21 VITALS — BP 161/98 | HR 84 | Ht 68.0 in | Wt 214.0 lb

## 2016-07-21 DIAGNOSIS — E7800 Pure hypercholesterolemia, unspecified: Secondary | ICD-10-CM | POA: Diagnosis not present

## 2016-07-21 DIAGNOSIS — E6609 Other obesity due to excess calories: Secondary | ICD-10-CM

## 2016-07-21 DIAGNOSIS — I119 Hypertensive heart disease without heart failure: Secondary | ICD-10-CM | POA: Diagnosis not present

## 2016-07-21 MED ORDER — TRIAMTERENE-HCTZ 37.5-25 MG PO TABS: ORAL_TABLET | ORAL | 5 refills | Status: DC

## 2016-07-21 MED ORDER — HYDRALAZINE HCL 50 MG PO TABS: ORAL_TABLET | ORAL | 3 refills | Status: DC

## 2016-07-21 NOTE — Patient Instructions (Signed)
Medication Instructions:  CHANGE YOUR HYDRALAZINE TO 50 MG THREE TIMES A DAY  START TRIAMTERENE-HCT 37.5-25 MG 1/2 TABLET DAILY   Labwork: FASTING LP/CMET WHEN YOU RETURN FOR PHARM D  Testing/Procedures: NONE  Follow-Up: Your physician recommends that you schedule a follow-up appointment in: PHARM D FOR BLOOD PRESSURE IN 1 MONTH   Your physician wants you to follow-up in: 1 YEAR OV You will receive a reminder letter in the mail two months in advance. If you don't receive a letter, please call our office to schedule the follow-up appointment.  If you need a refill on your cardiac medications before your next appointment, please call your pharmacy.

## 2016-07-21 NOTE — Addendum Note (Signed)
Addended by: Regis Bill B on: 07/21/2016 06:17 PM   Modules accepted: Orders

## 2016-07-21 NOTE — Progress Notes (Signed)
Cardiology Office Note   Date:  07/21/2016   ID:  Cynthia Forbes, DOB 1950-05-07, MRN [id number]  PCP:  Pincus Sanes, MD  Cardiologist:   Chilton Si, MD   Chief Complaint  Patient presents with  . Follow-up  . Dizziness    when BP goes up and down.       History of Present Illness: Cynthia Forbes is a 66 y.o. female hypertension, hyperlipidemia, diabetes, and mitral valve prolapse here for follow up.  She was first seen 06/25/16 for the evaluation of an abnormal EKG with anterolateral T wave inversions.  It was unchanged from 2015.  At the time she was feeling well and denied chest pain or shortness of breath.  She was not interested in stress testing.  Her blood pressure was elevated, so she was switched from metoprolol to carvedilol.  Since then her blood pressure has been better but still elevated. It is mostly been in the 130s-150/70s-80s.  She's been under a lot of stress lately both at work and at home. Someone packed into her account and deposited several thousand dollars. This was identified by the bank but people viewed positive Cynthia Forbes have contacted her trying to get her money. This is been very stressful and her blood pressure has been more elevated in finding this out. She has not experienced any chest pain or pressure. In the past her blood pressure is very elevated she felt discomfort in her neck. This has not happened since her last appointment.  Cynthia Forbes has been working on her diet and trying to exercise more. She's lost 6 pounds since her last appointment. She has no exertional symptoms and has not noted any lower extremity edema, orthopnea, or PND. She has started taking her pravastatin regularly.   Past Medical History:  Diagnosis Date  . Bronchitis   . Diabetes mellitus without complication (HCC)   . Hyperlipemia   . Hypertension   . Kidney stones 2005 & 08/2011    passed spontaneously X 2    Past Surgical History:  Procedure Laterality Date  .  ABDOMINAL HYSTERECTOMY  10/05/11   Dr Jennette Kettle  . OOPHORECTOMY  10/05/11   degenerative fibroid; Dr Jennette Kettle  . TONSILLECTOMY    . vaginal bleed  10/19/11   hematoma; Dr Marcelle Overlie     Current Outpatient Prescriptions  Medication Sig Dispense Refill  . albuterol (PROAIR HFA) 108 (90 Base) MCG/ACT inhaler Inhale 2 puffs into the lungs every 6 (six) hours as needed for wheezing or shortness of breath. 18 g 0  . aspirin 81 MG chewable tablet Chew 81 mg by mouth daily.    . carvedilol (COREG) 25 MG tablet Take 1 tablet (25 mg total) by mouth 2 (two) times daily. 60 tablet 5  . fluticasone (FLONASE) 50 MCG/ACT nasal spray Place 2 sprays into both nostrils daily as needed for allergies or rhinitis.    . hydrALAZINE (APRESOLINE) 50 MG tablet TAKE 1 TABLET BY MOUTH 3 TIMES A DAY 90 tablet 3  . pravastatin (PRAVACHOL) 40 MG tablet Take 1 tablet (40 mg total) by mouth at bedtime. Reported on 09/25/2015 90 tablet 3  . Vitamin D, Ergocalciferol, (DRISDOL) 50000 units CAPS capsule Take 1 capsule (50,000 Units total) by mouth every 7 (seven) days. 12 capsule 0  . triamterene-hydrochlorothiazide (MAXZIDE-25) 37.5-25 MG tablet TAKE 1/2 TABLET BY MOUTH DAILY 15 tablet 5   No current facility-administered medications for this visit.     Allergies:   Codeine; Ivp  dye [iodinated diagnostic agents]; Benazepril; Benicar [olmesartan]; Clonidine derivatives; and Strawberry extract    Social History:  The patient  reports that she has never smoked. She has never used smokeless tobacco. She reports that she does not drink alcohol or use drugs.   Family History:  The patient's family history includes Alzheimer's disease in her mother; Cancer in her brother and mother; Diabetes in her father; Kidney disease in her father; Liver cancer in her maternal grandfather; Prostate cancer in her father.    ROS:  Please see the history of present illness.   Otherwise, review of systems are positive for none.   All other systems are  reviewed and negative.    PHYSICAL EXAM: VS:  BP (!) 161/98   Pulse 84   Ht  (1.727 m)   Wt 97.1 kg (214 lb)   BMI 32.54 kg/m  , BMI Body mass index is 32.54 kg/m. GENERAL:  Well appearing.  Anxious HEENT:  Pupils equal round and reactive, fundi not visualized, oral mucosa unremarkable NECK:  No jugular venous distention, waveform within normal limits, carotid upstroke brisk and symmetric, no bruits LYMPHATICS:  No cervical adenopathy LUNGS:  Clear to auscultation bilaterally.  No crackles, rhonchi or wheezes HEART:  RRR.  PMI not displaced or sustained,S1 and S2 within normal limits, no S3, no S4, no clicks, no rubs, no murmurs ABD:  Flat, positive bowel sounds normal in frequency in pitch, no bruits, no rebound, no guarding, no midline pulsatile mass, no hepatomegaly, no splenomegaly EXT:  2 plus pulses throughout, no edema, no cyanosis no clubbing SKIN:  No rashes no nodules NEURO:  Cranial nerves II through XII grossly intact, motor grossly intact throughout PSYCH:  Cognitively intact, oriented to person place and time   EKG:  EKG is not ordered today. The ekg ordered 06/08/16 demonstrates sinus rhythm. Rate 68 bpm. Left anterior fascicular block. Anterolateral T wave inversions. 01/23/14:  Sinus rhythm. Rate 98 bpm. early R wave transition. Left anterior fascicular block. Anterolateral T wave inversions.  Recent Labs: 06/08/2016: ALT 13; BUN 12; Creatinine, Ser 0.92; Hemoglobin 12.9; Platelets 331.0; Potassium 3.8; Sodium 142; TSH 1.27    Lipid Panel    Component Value Date/Time   CHOL 198 06/08/2016 1615   TRIG 96.0 06/08/2016 1615   HDL 47.90 06/08/2016 1615   CHOLHDL 4 06/08/2016 1615   VLDL 19.2 06/08/2016 1615   LDLCALC 131 (H) 06/08/2016 1615   LDLDIRECT 176.7 01/11/2013 0821      Wt Readings from Last 3 Encounters:  07/21/16 97.1 kg (214 lb)  06/25/16 99.8 kg (220 lb)  06/08/16 99.8 kg (220 lb)      ASSESSMENT AND PLAN:  # Hypertension: Blood  pressure  is better but remains elevated. Continue the carvedilol and increase hydralazine to 50 mg tid.  Start HCTZ/triamterene 12.5/18/75mg  daily.  She will need a CMP in 1 week.  # Hyperlipidemia: Continue pravastatin. Check lipids and CMP at follow up.  # Obesity: Congratulated on weight loss and exercise.   Current medicines are reviewed at length with the patient today.  The patient does not have concerns regarding medicines.  The following changes have been made:  Increase hydralazine to 50 mg tid and start maxzide.  Labs/ tests ordered today include:  No orders of the defined types were placed in this encounter.    Disposition:   FU with Abdulla Pooley C. Duke Salvia, MD, [facility] in 1 year.  Pharmacy appointment for BP check in 1 week.  This note was written with the assistance of speech recognition software.  Please excuse any transcriptional errors.  Signed, Jennavecia Schwier C. Duke Salvia, MD, [facility]  07/21/2016 8:34 AM    [facility]

## 2016-08-26 ENCOUNTER — Telehealth: Payer: Self-pay | Admitting: *Deleted

## 2016-08-26 ENCOUNTER — Ambulatory Visit (INDEPENDENT_AMBULATORY_CARE_PROVIDER_SITE_OTHER): Payer: 59 | Admitting: Pharmacist

## 2016-08-26 VITALS — BP 148/96 | HR 66

## 2016-08-26 DIAGNOSIS — I10 Essential (primary) hypertension: Secondary | ICD-10-CM | POA: Diagnosis not present

## 2016-08-26 MED ORDER — BLOOD PRESSURE MONITOR KIT: 1.0000 | PACK | Freq: Every day | 0 refills | Status: DC

## 2016-08-26 NOTE — Progress Notes (Signed)
Patient ID: Cynthia Forbes                 DOB: 04/07/1950                      MRN: [id number]     HPI: Cynthia Forbes is a 66 y.o. female referred by Dr. Duke Salvia to HTN clinic. PMH includes hypertension, hyperlipidemia, diabetes, and mitral valve prolapse. Noted multiple ADR/allergies to anti-hypertensive medication.  Hydralazine dose was increased from 25mg  tid to 50mg  tid during most recent OV with Dr Duke Salvia on 07/21/16.  Maxdize also added to therapy on the same day with instructions to repeat BMET 2 weeks after.  Patient presents to HTN clinic for evaluation and medication titrations as needed. No recent BMET noted in computer.  No dizziness, shortness of breath, fatigue or chest pain. Reports a very stressful job and complains of urgency to use the bathroom due to new diuretic.   Current HTN meds:  Triamterene/HCTZ 37.5mg -25mg   1/2 tablet daily  Hydralazine 50mg  three times a day Carvedilol 25mg  twice daily  Previously tried:  Metoprolol succ - changed to carvedilol Benazepril - shortness of breath and tachicardia Olmesartan - hair loss Clonidine  - insomnia, dry mouth, dizziness  BP goal: <130/80  Family History: father with prostate cancer, diabetes and kidney disease; mother with Alzheimer's and cancer  Social History: Denies tobacco use, alcohol use or any other illicit drug  Diet: about half home cooker meal and some eat out. Mainly snack for lunch.  Exercise: 30 minutes walking about 2 times per week  Home BP readings:no cuff at home (need Rx to get cuff for home use) - prefers wrist cuff  Wt Readings from Last 3 Encounters:  07/21/16 214 lb (97.1 kg)  06/25/16 220 lb (99.8 kg)  06/08/16 220 lb (99.8 kg)   BP Readings from Last 3 Encounters:  08/26/16 (!) 148/96  07/21/16 (!) 161/98  06/25/16 (!) 172/114   Pulse Readings from Last 3 Encounters:  08/26/16 66  07/21/16 84  06/25/16 71    Renal function: CrCl cannot be calculated (Patient's most recent lab  result is older than the maximum 21 days allowed.).  Past Medical History:  Diagnosis Date  . Bronchitis   . Diabetes mellitus without complication (HCC)   . Hyperlipemia   . Hypertension   . Kidney stones 2005 & 08/2011    passed spontaneously X 2    Current Outpatient Prescriptions on File Prior to Visit  Medication Sig Dispense Refill  . albuterol (PROAIR HFA) 108 (90 Base) MCG/ACT inhaler Inhale 2 puffs into the lungs every 6 (six) hours as needed for wheezing or shortness of breath. 18 g 0  . aspirin 81 MG chewable tablet Chew 81 mg by mouth daily.    . carvedilol (COREG) 25 MG tablet Take 1 tablet (25 mg total) by mouth 2 (two) times daily. 60 tablet 5  . fluticasone (FLONASE) 50 MCG/ACT nasal spray Place 2 sprays into both nostrils daily as needed for allergies or rhinitis.    . hydrALAZINE (APRESOLINE) 50 MG tablet TAKE 1 TABLET BY MOUTH 3 TIMES A DAY 90 tablet 3  . pravastatin (PRAVACHOL) 40 MG tablet Take 1 tablet (40 mg total) by mouth at bedtime. Reported on 09/25/2015 90 tablet 3  . triamterene-hydrochlorothiazide (MAXZIDE-25) 37.5-25 MG tablet TAKE 1/2 TABLET BY MOUTH DAILY 15 tablet 5  . Vitamin D, Ergocalciferol, (DRISDOL) 50000 units CAPS capsule Take 1 capsule (50,000 Units total)  by mouth every 7 (seven) days. 12 capsule 0   No current facility-administered medications on file prior to visit.     Allergies  Allergen Reactions  . Codeine     Rash , tachycardia & dyspnea Able to take Percocet  . Ivp Dye [Iodinated Diagnostic Agents] Hives and Shortness Of Breath  . Benazepril     Heart racing, sob  . Benicar [Olmesartan]     Hair loss D/Ced after samples  . Clonidine Derivatives     See 08/07/13 dizziness, dry mouth/eyes, insomnia  . Strawberry Extract Hives    Blood pressure (!) 148/96, pulse 66, SpO2 98 %.  Essential hypertension:  Blood pressure remains above goal during office visit today, but improved from previous readings. Hydralazine and Maxide are  the most recent therapy changes but patient had NOT repeat her blood work as ordered by cardiologist.  Patient is not monitoring BP at home either and report not taking any medication this morning before her appointment..  Will sent and order to the pharmacy for a new BP cuff, continue current therapy without changes, and repeat BMET. Patient instructed to continue lifestyle modifications and start recording BP readings at home/work to bring to next OV. If BP remains elevated and BMET remains within normal limit; plan to add Doxazosin to her therapy.  Avari Gelles Rodriguez-Guzman PharmD, BCPS [facility] [address] [place] [phone] 08/26/2016 8:13 AM

## 2016-08-26 NOTE — Telephone Encounter (Signed)
Sent blood pressure Rx to [facility] as requested by Raquel Pharm D

## 2016-08-26 NOTE — Patient Instructions (Addendum)
Return for a a follow up appointment in 4 weeks (Blood work within 2 weeks)  Your blood pressure today is 148/96 pulse 66  Check your blood pressure at home daily (if able) and keep record of the readings.  Take your BP meds as follows: **Continue all medication as prescribed**  Bring  your BP cuff and your record of home blood pressures to your next appointment.  Exercise as you're able, try to walk approximately 30 minutes per day.  Keep salt intake to a minimum, especially watch canned and prepared boxed foods.  Eat more fresh fruits and vegetables and fewer canned items.  Avoid eating in fast food restaurants.    HOW TO TAKE YOUR BLOOD PRESSURE: . Rest 5 minutes before taking your blood pressure. .  Don't smoke or drink caffeinated beverages for at least 30 minutes before. . Take your blood pressure before (not after) you eat. . Sit comfortably with your back supported and both feet on the floor (don't cross your legs). . Elevate your arm to heart level on a table or a desk. . Use the proper sized cuff. It should fit smoothly and snugly around your bare upper arm. There should be enough room to slip a fingertip under the cuff. The bottom edge of the cuff should be 1 inch above the crease of the elbow. . Ideally, take 3 measurements at one sitting and record the average.

## 2016-09-30 ENCOUNTER — Ambulatory Visit (INDEPENDENT_AMBULATORY_CARE_PROVIDER_SITE_OTHER): Payer: 59 | Admitting: Pharmacist

## 2016-09-30 ENCOUNTER — Other Ambulatory Visit: Payer: Self-pay | Admitting: *Deleted

## 2016-09-30 VITALS — BP 138/90 | HR 76

## 2016-09-30 DIAGNOSIS — I10 Essential (primary) hypertension: Secondary | ICD-10-CM

## 2016-09-30 LAB — LIPID PANEL
CHOLESTEROL TOTAL: 174 mg/dL (ref 100–199)
Chol/HDL Ratio: 4 ratio (ref 0.0–4.4)
HDL: 44 mg/dL (ref >39)
LDL Calculated: 110 mg/dL — ABNORMAL HIGH (ref 0–99)
TRIGLYCERIDES: 99 mg/dL (ref 0–149)
VLDL Cholesterol Cal: 20 mg/dL (ref 5–40)

## 2016-09-30 NOTE — Progress Notes (Signed)
Patient ID: Cynthia Forbes                 DOB: [birth year]                      MRN: [id number]     HPI: Cynthia Forbes is a 65 y.o. female referred by Dr. Oval Linsey to HTN clinic. PMH includes hypertension, hyperlipidemia, diabetes, and mitral valve prolapse. Noted multiple ADR/allergies to anti-hypertensive medication.  Maxdize also added to therapy on 07/21/16 and patient received instructions to repeat BMET 2 weeks after.   Patient presents to HTN clinic for follow up. Blood work not done yet and no BP home records available either. Patient denies dizziness, headaches, fatigue or swelling. Patient also reports great levels of stress at work and she self-adjusted her diuretic to every other day instead of daily.  Current HTN meds:  Triamterene/HCTZ 37.57m-25mg  1/2 tablet daily  Hydralazine 566mthree times a day Carvedilol 2572mwice daily  Previously tried:  Metoprolol succ - changed to carvedilol Benazepril - shortness of breath and tachicardia Olmesartan - hair loss Clonidine  - insomnia, dry mouth, dizziness  BP goal: <130/80  Family History: father with prostate cancer, diabetes and kidney disease; mother with Alzheimer's and cancer  Social History: Denies tobacco use, alcohol use or any other illicit drug  Diet: about half home cooker meal and some eat out. Mainly snack for lunch.  Exercise: 30 minutes walking about 2 times per week  Home BP readings: none available  Wt Readings from Last 3 Encounters:  07/21/16 214 lb (97.1 kg)  06/25/16 220 lb (99.8 kg)  06/08/16 220 lb (99.8 kg)   BP Readings from Last 3 Encounters:  09/30/16 138/90  08/26/16 (!) 148/96  07/21/16 (!) 161/98   Pulse Readings from Last 3 Encounters:  09/30/16 76  08/26/16 66  07/21/16 84    Past Medical History:  Diagnosis Date  . Bronchitis   . Diabetes mellitus without complication ([facility] . Hyperlipemia   . Hypertension   . Kidney stones 2005 & 08/2011    passed spontaneously X 2     Current Outpatient Prescriptions on File Prior to Visit  Medication Sig Dispense Refill  . albuterol (PROAIR HFA) 108 (90 Base) MCG/ACT inhaler Inhale 2 puffs into the lungs every 6 (six) hours as needed for wheezing or shortness of breath. 18 g 0  . aspirin 81 MG chewable tablet Chew 81 mg by mouth daily.    . Blood Pressure Monitor KIT 1 Package by Does not apply route daily. Check blood pressure as directed by pharmacist or physician. Diagnosis code I10 1 each 0  . carvedilol (COREG) 25 MG tablet Take 1 tablet (25 mg total) by mouth 2 (two) times daily. 60 tablet 5  . fluticasone (FLONASE) 50 MCG/ACT nasal spray Place 2 sprays into both nostrils daily as needed for allergies or rhinitis.    . hydrALAZINE (APRESOLINE) 50 MG tablet TAKE 1 TABLET BY MOUTH 3 TIMES A DAY 90 tablet 3  . pravastatin (PRAVACHOL) 40 MG tablet Take 1 tablet (40 mg total) by mouth at bedtime. Reported on 09/25/2015 90 tablet 3  . triamterene-hydrochlorothiazide (MAXZIDE-25) 37.5-25 MG tablet TAKE 1/2 TABLET BY MOUTH DAILY 15 tablet 5  . Vitamin D, Ergocalciferol, (DRISDOL) 50000 units CAPS capsule Take 1 capsule (50,000 Units total) by mouth every 7 (seven) days. 12 capsule 0   No current facility-administered medications on file prior to visit.  Allergies  Allergen Reactions  . Codeine     Rash , tachycardia & dyspnea Able to take Percocet  . Ivp Dye [Iodinated Diagnostic Agents] Hives and Shortness Of Breath  . Benazepril     Heart racing, sob  . Benicar [Olmesartan]     Hair loss D/Ced after samples  . Clonidine Derivatives     See 08/07/13 dizziness, dry mouth/eyes, insomnia  . Strawberry Extract Hives    Blood pressure 138/90, pulse 76, SpO2 97 %.  Essential hypertension: Blood pressure remains above desired goal of 130/80. Patient has issues with therapy like: medication self-adjustments, and non-compliance with blood work orders.   Impact of elevated blood pressure on heart health and renal  health was also discussed during this appointment. Will continue current therapy as previously prescribed. Patient encouraged to resume taking Triamterene/HCTZ 37.60m-25mg 1/2 tablet daily.  She is to measure BP daily at home and keep records to bring to next follow up in 6 weeks. Patient is to repeat BMET today before leaving the clinic.  Cynthia Forbes PharmD, [id number]/07/2016 7:58 PM

## 2016-09-30 NOTE — Assessment & Plan Note (Signed)
Blood pressure remains above desired goal of 130/80. Patient has issues with therapy like: medication self-adjustments, and non-compliance with blood work orders.   Impact of elevated blood pressure on heart health and renal health was also discussed during this appointment. Will continue current therapy as previously prescribed. Patient encouraged to resume taking Triamterene/HCTZ 37.5mg -25mg  1/2 tablet daily.  She is to measure BP daily at home and keep records to bring to next follow up in 6 weeks.

## 2016-09-30 NOTE — Patient Instructions (Addendum)
Return for a  follow up appointment in 6 weeks (call to make appointment)   Your blood pressure today is 138/90 pulse 76  Check your blood pressure at home daily (if able) and keep record of the readings.  Take your BP meds as follows: **ALL medication as prescribed**  Bring all of your meds, your BP cuff and your record of home blood pressures to your next appointment.  Exercise as you're able, try to walk approximately 30 minutes per day.  Keep salt intake to a minimum, especially watch canned and prepared boxed foods.  Eat more fresh fruits and vegetables and fewer canned items.  Avoid eating in fast food restaurants.    HOW TO TAKE YOUR BLOOD PRESSURE: . Rest 5 minutes before taking your blood pressure. .  Don't smoke or drink caffeinated beverages for at least 30 minutes before. . Take your blood pressure before (not after) you eat. . Sit comfortably with your back supported and both feet on the floor (don't cross your legs). . Elevate your arm to heart level on a table or a desk. . Use the proper sized cuff. It should fit smoothly and snugly around your bare upper arm. There should be enough room to slip a fingertip under the cuff. The bottom edge of the cuff should be 1 inch above the crease of the elbow. . Ideally, take 3 measurements at one sitting and record the average.

## 2016-10-01 LAB — COMPREHENSIVE METABOLIC PANEL
A/G RATIO: 1.4 (ref 1.2–2.2)
ALT: 11 IU/L (ref 0–32)
AST: 14 IU/L (ref 0–40)
Albumin: 4.3 g/dL (ref 3.6–4.8)
Alkaline Phosphatase: 78 IU/L (ref 39–117)
BUN/Creatinine Ratio: 8 — ABNORMAL LOW (ref 12–28)
BUN: 8 mg/dL (ref 8–27)
Bilirubin Total: 0.3 mg/dL (ref 0.0–1.2)
CALCIUM: 9.6 mg/dL (ref 8.7–10.3)
CO2: 24 mmol/L (ref 20–29)
CREATININE: 1.01 mg/dL — AB (ref 0.57–1.00)
Chloride: 105 mmol/L (ref 96–106)
GFR, EST AFRICAN AMERICAN: 68 mL/min/{1.73_m2} (ref >59)
GFR, EST NON AFRICAN AMERICAN: 59 mL/min/{1.73_m2} — AB (ref >59)
GLOBULIN, TOTAL: 3 g/dL (ref 1.5–4.5)
Glucose: 103 mg/dL — ABNORMAL HIGH (ref 65–99)
POTASSIUM: 4.1 mmol/L (ref 3.5–5.2)
SODIUM: 141 mmol/L (ref 134–144)
TOTAL PROTEIN: 7.3 g/dL (ref 6.0–8.5)

## 2016-10-07 ENCOUNTER — Telehealth: Payer: Self-pay | Admitting: *Deleted

## 2016-10-07 DIAGNOSIS — E7800 Pure hypercholesterolemia, unspecified: Secondary | ICD-10-CM

## 2016-10-07 DIAGNOSIS — Z79899 Other long term (current) drug therapy: Secondary | ICD-10-CM

## 2016-10-07 DIAGNOSIS — I10 Essential (primary) hypertension: Secondary | ICD-10-CM

## 2016-10-07 NOTE — Telephone Encounter (Signed)
-----   Message from Chilton Siiffany Callaway, MD sent at 10/06/2016 11:06 AM EDT ----- Cholesterol levels are better but still not where they need to be.  LDL should be <100.  If she has been taking it regularly, increase to 80 mg and repeat in 6 weeks.

## 2016-10-07 NOTE — Telephone Encounter (Signed)
Patient has not been taking every day, will try to do better and recheck in 6 weeks

## 2017-04-23 ENCOUNTER — Other Ambulatory Visit: Payer: Self-pay | Admitting: Cardiovascular Disease

## 2017-04-25 NOTE — Telephone Encounter (Signed)
Please review for refill, Thanks !  

## 2017-04-25 NOTE — Telephone Encounter (Signed)
Rx request sent to pharmacy.  

## 2017-06-29 ENCOUNTER — Telehealth: Payer: Self-pay | Admitting: Cardiovascular Disease

## 2017-06-29 MED ORDER — CARVEDILOL 25 MG PO TABS: 25.0000 mg | ORAL_TABLET | Freq: Two times a day (BID) | ORAL | 0 refills | Status: DC

## 2017-06-29 NOTE — Telephone Encounter (Signed)
New Message      *STAT* If patient is at the pharmacy, call can be transferred to refill team.   1. Which medications need to be refilled? (please list name of each medication and dose if known)  carvedilol (COREG) 25 MG tablet TAKE 1 TABLET BY MOUTH TWICE DAILY (DISCONTINUE METOPROLOL)        2. Which pharmacy/location (including street and city if local pharmacy) is medication to be sent to? [facility] [place]   3. Do they need a 30 day or 90 day supply? 90

## 2017-07-20 ENCOUNTER — Other Ambulatory Visit: Payer: Self-pay | Admitting: Internal Medicine

## 2017-07-20 NOTE — Telephone Encounter (Signed)
Copied from CRM [phone]. Topic: Quick Communication - Rx Refill/Question >> Jul 20, 2017 11:17 AM Crist InfanteHarrald, Kathy J wrote: Medication:  1. hydrALAZINE (APRESOLINE) 50 MG tablet  Pt states the dr has her taking 2 in the am , 1 at lunch and 2 at night (5 tabs a day) The prior instruction were for only 4 pills a day, pt taking 5 pills a day.   2. pravastatin (PRAVACHOL) 40 MG tablet (1/day)  Pt is out of her meds and her appt is not until 5/14  Can you send in enough to get her to the 08/09/17  appt?  [facility] [address] - [place] ([address]), [place] - [address] [phone] (Phone) [phone] (Fax)

## 2017-07-21 NOTE — Telephone Encounter (Signed)
Patient has tried several times to pick up her triamterene-hydrochlorothiazide (MAXZIDE-25) 37.5-25 MG tablet [facility] [address] They keep stating they have not received it when the practice received confirmation that it was sent.  So she does not want to deal with this pharmacy anymore and would like for it to be deleted from her record.  Please send the prescription to [facility] [address]

## 2017-07-21 NOTE — Telephone Encounter (Signed)
Patient called and clarified which medication she needs refilled and to the pharmacy.  She says she only needs Hydralazine and her inhaler.  She is out of her BP medication and would like a refill until her appointment 08/09/17.  Hydralazine and Albuterol inhaler Last OV:06/08/16 PCP: [id number]- [place], [place] - [address] [phone] (Phone) [phone] (Fax)

## 2017-07-22 MED ORDER — ALBUTEROL SULFATE HFA 108 (90 BASE) MCG/ACT IN AERS: 2.0000 | INHALATION_SPRAY | Freq: Four times a day (QID) | RESPIRATORY_TRACT | 0 refills | Status: DC | PRN

## 2017-07-22 MED ORDER — HYDRALAZINE HCL 50 MG PO TABS: ORAL_TABLET | ORAL | 0 refills | Status: DC

## 2017-07-22 NOTE — Telephone Encounter (Signed)
Per office policy sent 30 day to local pharmacy until appt.../lmb  

## 2017-08-08 NOTE — Patient Instructions (Addendum)
Test(s) ordered today. Your results will be released to [place] (or called to you) after review, usually within 72hours after test completion. If any changes need to be made, you will be notified at that same time.  All other Health Maintenance issues reviewed.   All recommended immunizations and age-appropriate screenings are up-to-date or discussed.  No immunizations administered today.   Medications reviewed and updated.  No changes recommended at this time.  Your prescription(s) have been submitted to your pharmacy. Please take as directed and contact our office if you believe you are having problem(s) with the medication(s).   Please followup in 6 months   Health Maintenance, Female Adopting a healthy lifestyle and getting preventive care can go a long way to promote health and wellness. Talk with your health care provider about what schedule of regular examinations is right for you. This is a good chance for you to check in with your provider about disease prevention and staying healthy. In between checkups, there are plenty of things you can do on your own. Experts have done a lot of research about which lifestyle changes and preventive measures are most likely to keep you healthy. Ask your health care provider for more information. Weight and diet Eat a healthy diet  Be sure to include plenty of vegetables, fruits, low-fat dairy products, and lean protein.  Do not eat a lot of foods high in solid fats, added sugars, or salt.  Get regular exercise. This is one of the most important things you can do for your health. ? Most adults should exercise for at least 150 minutes each week. The exercise should increase your heart rate and make you sweat (moderate-intensity exercise). ? Most adults should also do strengthening exercises at least twice a week. This is in addition to the moderate-intensity exercise.  Maintain a healthy weight  Body mass index (BMI) is a measurement that can  be used to identify possible weight problems. It estimates body fat based on height and weight. Your health care provider can help determine your BMI and help you achieve or maintain a healthy weight.  For females 13 years of age and older: ? A BMI below 18.5 is considered underweight. ? A BMI of 18.5 to 24.9 is normal. ? A BMI of 25 to 29.9 is considered overweight. ? A BMI of 30 and above is considered obese.  Watch levels of cholesterol and blood lipids  You should start having your blood tested for lipids and cholesterol at 65 years of age, then have this test every 5 years.  You may need to have your cholesterol levels checked more often if: ? Your lipid or cholesterol levels are high. ? You are older than 65 years of age. ? You are at high risk for heart disease.  Cancer screening Lung Cancer  Lung cancer screening is recommended for adults 54-15 years old who are at high risk for lung cancer because of a history of smoking.  A yearly low-dose CT scan of the lungs is recommended for people who: ? Currently smoke. ? Have quit within the past 15 years. ? Have at least a 30-pack-year history of smoking. A pack year is smoking an average of one pack of cigarettes a day for 1 year.  Yearly screening should continue until it has been 15 years since you quit.  Yearly screening should stop if you develop a health problem that would prevent you from having lung cancer treatment.  Breast Cancer  Practice breast self-awareness.  This means understanding how your breasts normally appear and feel.  It also means doing regular breast self-exams. Let your health care provider know about any changes, no matter how small.  If you are in your 20s or 30s, you should have a clinical breast exam (CBE) by a health care provider every 1-3 years as part of a regular health exam.  If you are 29 or older, have a CBE every year. Also consider having a breast X-ray (mammogram) every year.  If you  have a family history of breast cancer, talk to your health care provider about genetic screening.  If you are at high risk for breast cancer, talk to your health care provider about having an MRI and a mammogram every year.  Breast cancer gene (BRCA) assessment is recommended for women who have family members with BRCA-related cancers. BRCA-related cancers include: ? Breast. ? Ovarian. ? Tubal. ? Peritoneal cancers.  Results of the assessment will determine the need for genetic counseling and BRCA1 and BRCA2 testing.  Cervical Cancer Your health care provider may recommend that you be screened regularly for cancer of the pelvic organs (ovaries, uterus, and vagina). This screening involves a pelvic examination, including checking for microscopic changes to the surface of your cervix (Pap test). You may be encouraged to have this screening done every 3 years, beginning at age 40.  For women ages 44-65, health care providers may recommend pelvic exams and Pap testing every 3 years, or they may recommend the Pap and pelvic exam, combined with testing for human papilloma virus (HPV), every 5 years. Some types of HPV increase your risk of cervical cancer. Testing for HPV may also be done on women of any age with unclear Pap test results.  Other health care providers may not recommend any screening for nonpregnant women who are considered low risk for pelvic cancer and who do not have symptoms. Ask your health care provider if a screening pelvic exam is right for you.  If you have had past treatment for cervical cancer or a condition that could lead to cancer, you need Pap tests and screening for cancer for at least 20 years after your treatment. If Pap tests have been discontinued, your risk factors (such as having a new sexual partner) need to be reassessed to determine if screening should resume. Some women have medical problems that increase the chance of getting cervical cancer. In these cases,  your health care provider may recommend more frequent screening and Pap tests.  Colorectal Cancer  This type of cancer can be detected and often prevented.  Routine colorectal cancer screening usually begins at 65 years of age and continues through 65 years of age.  Your health care provider may recommend screening at an earlier age if you have risk factors for colon cancer.  Your health care provider may also recommend using home test kits to check for hidden blood in the stool.  A small camera at the end of a tube can be used to examine your colon directly (sigmoidoscopy or colonoscopy). This is done to check for the earliest forms of colorectal cancer.  Routine screening usually begins at age 96.  Direct examination of the colon should be repeated every 5-10 years through 65 years of age. However, you may need to be screened more often if early forms of precancerous polyps or small growths are found.  Skin Cancer  Check your skin from head to toe regularly.  Tell your health care provider about any  new moles or changes in moles, especially if there is a change in a mole's shape or color.  Also tell your health care provider if you have a mole that is larger than the size of a pencil eraser.  Always use sunscreen. Apply sunscreen liberally and repeatedly throughout the day.  Protect yourself by wearing long sleeves, pants, a wide-brimmed hat, and sunglasses whenever you are outside.  Heart disease, diabetes, and high blood pressure  High blood pressure causes heart disease and increases the risk of stroke. High blood pressure is more likely to develop in: ? People who have blood pressure in the high end of the normal range (130-139/85-89 mm Hg). ? People who are overweight or obese. ? People who are African American.  If you are 58-1 years of age, have your blood pressure checked every 3-5 years. If you are 52 years of age or older, have your blood pressure checked every year.  You should have your blood pressure measured twice-once when you are at a hospital or clinic, and once when you are not at a hospital or clinic. Record the average of the two measurements. To check your blood pressure when you are not at a hospital or clinic, you can use: ? An automated blood pressure machine at a pharmacy. ? A home blood pressure monitor.  If you are between 24 years and 65 years old, ask your health care provider if you should take aspirin to prevent strokes.  Have regular diabetes screenings. This involves taking a blood sample to check your fasting blood sugar level. ? If you are at a normal weight and have a low risk for diabetes, have this test once every three years after 65 years of age. ? If you are overweight and have a high risk for diabetes, consider being tested at a younger age or more often. Preventing infection Hepatitis B  If you have a higher risk for hepatitis B, you should be screened for this virus. You are considered at high risk for hepatitis B if: ? You were born in a country where hepatitis B is common. Ask your health care provider which countries are considered high risk. ? Your parents were born in a high-risk country, and you have not been immunized against hepatitis B (hepatitis B vaccine). ? You have HIV or AIDS. ? You use needles to inject street drugs. ? You live with someone who has hepatitis B. ? You have had sex with someone who has hepatitis B. ? You get hemodialysis treatment. ? You take certain medicines for conditions, including cancer, organ transplantation, and autoimmune conditions.  Hepatitis C  Blood testing is recommended for: ? Everyone born from [birth year] through 1963. ? Anyone with known risk factors for hepatitis C.  Sexually transmitted infections (STIs)  You should be screened for sexually transmitted infections (STIs) including gonorrhea and chlamydia if: ? You are sexually active and are younger than 65 years of  age. ? You are older than 65 years of age and your health care provider tells you that you are at risk for this type of infection. ? Your sexual activity has changed since you were last screened and you are at an increased risk for chlamydia or gonorrhea. Ask your health care provider if you are at risk.  If you do not have HIV, but are at risk, it may be recommended that you take a prescription medicine daily to prevent HIV infection. This is called pre-exposure prophylaxis (PrEP). You are considered at  risk if: ? You are sexually active and do not regularly use condoms or know the HIV status of your partner(s). ? You take drugs by injection. ? You are sexually active with a partner who has HIV.  Talk with your health care provider about whether you are at high risk of being infected with HIV. If you choose to begin PrEP, you should first be tested for HIV. You should then be tested every 3 months for as long as you are taking PrEP. Pregnancy  If you are premenopausal and you may become pregnant, ask your health care provider about preconception counseling.  If you may become pregnant, take 400 to 800 micrograms (mcg) of folic acid every day.  If you want to prevent pregnancy, talk to your health care provider about birth control (contraception). Osteoporosis and menopause  Osteoporosis is a disease in which the bones lose minerals and strength with aging. This can result in serious bone fractures. Your risk for osteoporosis can be identified using a bone density scan.  If you are 6 years of age or older, or if you are at risk for osteoporosis and fractures, ask your health care provider if you should be screened.  Ask your health care provider whether you should take a calcium or vitamin D supplement to lower your risk for osteoporosis.  Menopause may have certain physical symptoms and risks.  Hormone replacement therapy may reduce some of these symptoms and risks. Talk to your health  care provider about whether hormone replacement therapy is right for you. Follow these instructions at home:  Schedule regular health, dental, and eye exams.  Stay current with your immunizations.  Do not use any tobacco products including cigarettes, chewing tobacco, or electronic cigarettes.  If you are pregnant, do not drink alcohol.  If you are breastfeeding, limit how much and how often you drink alcohol.  Limit alcohol intake to no more than 1 drink per day for nonpregnant women. One drink equals 12 ounces of beer, 5 ounces of wine, or 1 ounces of hard liquor.  Do not use street drugs.  Do not share needles.  Ask your health care provider for help if you need support or information about quitting drugs.  Tell your health care provider if you often feel depressed.  Tell your health care provider if you have ever been abused or do not feel safe at home. This information is not intended to replace advice given to you by your health care provider. Make sure you discuss any questions you have with your health care provider. Document Released: 09/28/2010 Document Revised: 08/21/2015 Document Reviewed: 12/17/2014 Elsevier Interactive Patient Education  [employer].

## 2017-08-08 NOTE — Progress Notes (Signed)
  Subjective:    Patient ID: Cynthia Forbes, female    DOB: 01/17/1951, 66 y.o.   MRN: [id number]  HPI She is here for a physical exam.   She feels exhausted at the end of the day.  She goes to sleep late in the evening - 7:30-8 pm and wakes up 1:30 or 2. She is wide awake.  She is up the rest of the day.  She may lay down at 5 until 6 or 6:30 some days.  She is ok during the day and then is exhausted when she gets home.     Her monitors her BP at home and it is well controlled with her medication.    Medications and allergies reviewed with patient and updated if appropriate.  Patient Active Problem List   Diagnosis Date Noted  . B12 deficiency 06/09/2016  . Vitamin D deficiency 06/09/2016  . 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 Diabetes (HCC) 08/20/2008  . Hyperlipidemia 06/13/2007  . NONSPECIFIC ABNORMAL ELECTROCARDIOGRAM 06/13/2007  . Essential hypertension 07/04/2006  . MITRAL VALVE PROLAPSE, HX OF 07/04/2006    Current Outpatient Medications on File Prior to Visit  Medication Sig Dispense Refill  . albuterol (PROAIR HFA) 108 (90 Base) MCG/ACT inhaler Inhale 2 puffs into the lungs every 6 (six) hours as needed for wheezing or shortness of breath. Must keep appt for future refills 18 g 0  . aspirin 81 MG chewable tablet Chew 81 mg by mouth daily.    . Blood Pressure Monitor KIT 1 Package by Does not apply route daily. Check blood pressure as directed by pharmacist or physician. Diagnosis code I10 1 each 0  . carvedilol (COREG) 25 MG tablet Take 1 tablet (25 mg total) by mouth 2 (two) times daily with a meal. KEEP OV. 180 tablet 0   No current facility-administered medications on file prior to visit.     Past Medical History:  Diagnosis Date  . Bronchitis   . Diabetes mellitus without complication (HCC)   . Hyperlipemia   . Hypertension   . Kidney stones 2005 & 08/2011    passed spontaneously X 2    Past Surgical History:  Procedure Laterality Date  . ABDOMINAL  HYSTERECTOMY  10/05/11   Dr Neal  . OOPHORECTOMY  10/05/11   degenerative fibroid; Dr Neal  . TONSILLECTOMY    . vaginal bleed  10/19/11   hematoma; Dr Holland    Social History   Socioeconomic History  . Marital status: Divorced    Spouse name: Not on file  . Number of children: Not on file  . Years of education: Not on file  . Highest education level: Not on file  Occupational History  . Not on file  Social Needs  . Financial resource strain: Not on file  . Food insecurity:    Worry: Not on file    Inability: Not on file  . Transportation needs:    Medical: Not on file    Non-medical: Not on file  Tobacco Use  . Smoking status: Never Smoker  . Smokeless tobacco: Never Used  Substance and Sexual Activity  . Alcohol use: No  . Drug use: No  . Sexual activity: Not Currently  Lifestyle  . Physical activity:    Days per week: Not on file    Minutes per session: Not on file  . Stress: Not on file  Relationships  . Social connections:    Talks on phone: Not on file      Gets together: Not on file    Attends religious service: Not on file    Active member of club or organization: Not on file    Attends meetings of clubs or organizations: Not on file    Relationship status: Not on file  Other Topics Concern  . Not on file  Social History Narrative  . Not on file    Family History  Problem Relation Age of Onset  . Alzheimer's disease Mother   . Cancer Mother   . Kidney disease Father   . Diabetes Father   . Prostate cancer Father   . Cancer Brother        spine  . Liver cancer Maternal Grandfather     Review of Systems  Constitutional: Negative for chills and fever.  Eyes: Negative for visual disturbance.  Respiratory: Negative for cough, shortness of breath and wheezing.   Cardiovascular: Negative for chest pain, palpitations and leg swelling.  Gastrointestinal: Negative for abdominal pain, blood in stool, constipation, diarrhea and nausea.  Genitourinary:  Negative for dysuria and hematuria.  Musculoskeletal: Negative for arthralgias and back pain.  Skin: Negative for color change and rash.  Neurological: Negative for light-headedness and headaches.  Psychiatric/Behavioral: Positive for sleep disturbance. Negative for dysphoric mood. The patient is not nervous/anxious.        Objective:   Vitals:   08/09/17 1413  BP: (!) 150/100  Pulse: 73  Temp: 98.6 F (37 C)  SpO2: 98%   Filed Weights   08/09/17 1413  Weight: 213 lb (96.6 kg)   Body mass index is 32.39 kg/m.  Wt Readings from Last 3 Encounters:  08/09/17 213 lb (96.6 kg)  07/21/16 214 lb (97.1 kg)  06/25/16 220 lb (99.8 kg)     Physical Exam Constitutional: She appears well-developed and well-nourished. No distress.  HENT:  Head: Normocephalic and atraumatic.  Right Ear: External ear normal. Normal ear canal and TM Left Ear: External ear normal.  Normal ear canal and TM Mouth/Throat: Oropharynx is clear and moist.  Eyes: Conjunctivae and EOM are normal.  Neck: Neck supple. No tracheal deviation present. No thyromegaly present.  No carotid bruit  Cardiovascular: Normal rate, regular rhythm and normal heart sounds.   No murmur heard.  No edema. Pulmonary/Chest: Effort normal and breath sounds normal. No respiratory distress. She has no wheezes. She has no rales.  Breast: deferred to Gyn Abdominal: Soft. She exhibits no distension. There is no tenderness.  Lymphadenopathy: She has no cervical adenopathy.  Skin: Skin is warm and dry. She is not diaphoretic.  Psychiatric: She has a normal mood and affect. Her behavior is normal.   Diabetic Foot Exam - Simple   Simple Foot Form Diabetic Foot exam was performed with the following findings:  Yes 08/09/2017  3:03 PM  Visual Inspection No deformities, no ulcerations, no other skin breakdown bilaterally:  Yes Sensation Testing Intact to touch and monofilament testing bilaterally:  Yes Pulse Check Posterior Tibialis and  Dorsalis pulse intact bilaterally:  Yes Comments         Assessment & Plan:   Physical exam: Screening blood work     ordered Immunizations   Discussed shingrix, td Colonoscopy  Doing cologuard - ordered Mammogram  - will schedule this year Gyn    Up to date   [facility]   Due this year - will scheudle EKG   Last done 05/2016 Exercise  Exercise bike 15 minutes, will start walking Weight  Working on weight loss Skin      No concerns Substance abuse      none  See Problem List for Assessment and Plan of chronic medical problems.   FU in 6 mo

## 2017-08-09 ENCOUNTER — Encounter: Payer: Self-pay | Admitting: Internal Medicine

## 2017-08-09 ENCOUNTER — Ambulatory Visit (INDEPENDENT_AMBULATORY_CARE_PROVIDER_SITE_OTHER): Payer: 59 | Admitting: Internal Medicine

## 2017-08-09 VITALS — BP 150/100 | HR 73 | Temp 98.6°F | Ht 68.0 in | Wt 213.0 lb

## 2017-08-09 DIAGNOSIS — Z1212 Encounter for screening for malignant neoplasm of rectum: Secondary | ICD-10-CM | POA: Diagnosis not present

## 2017-08-09 DIAGNOSIS — I10 Essential (primary) hypertension: Secondary | ICD-10-CM

## 2017-08-09 DIAGNOSIS — G479 Sleep disorder, unspecified: Secondary | ICD-10-CM | POA: Diagnosis not present

## 2017-08-09 DIAGNOSIS — E119 Type 2 diabetes mellitus without complications: Secondary | ICD-10-CM

## 2017-08-09 DIAGNOSIS — Z0000 Encounter for general adult medical examination without abnormal findings: Secondary | ICD-10-CM

## 2017-08-09 DIAGNOSIS — Z1211 Encounter for screening for malignant neoplasm of colon: Secondary | ICD-10-CM | POA: Diagnosis not present

## 2017-08-09 DIAGNOSIS — E782 Mixed hyperlipidemia: Secondary | ICD-10-CM

## 2017-08-09 MED ORDER — HYDRALAZINE HCL 50 MG PO TABS
ORAL_TABLET | ORAL | 3 refills | Status: DC
Start: 2017-08-09 — End: 2018-09-11

## 2017-08-09 NOTE — Assessment & Plan Note (Signed)
Not taking statin Check lipids, tsh, cmp Increase exercise Healthy diet Work on weight loss

## 2017-08-09 NOTE — Assessment & Plan Note (Signed)
Monitors BP at home -  130/81, 130/81, 125/72, 136/81 BP well controlled at home Current regimen effective and well tolerated Continue current medications at current doses Cmp, tsh, cbc

## 2017-08-09 NOTE — Assessment & Plan Note (Signed)
Discussed sleep hygeine - will push back bedtime Avoid drinking too many fluids in evening - she gets up to urinate at 2ish and has difficulty getting back to sleep Try melatonin Increase exercise Weight loss Discussed prescription medication and possible side effects - want to avoid if possible

## 2017-08-09 NOTE — Assessment & Plan Note (Addendum)
Diet controlled Check a1c Low sugar / carb diet Stressed regular exercise, weight loss Advised annual eye exam

## 2017-08-10 ENCOUNTER — Encounter: Payer: Self-pay | Admitting: Cardiovascular Disease

## 2017-08-12 ENCOUNTER — Telehealth: Payer: Self-pay | Admitting: Internal Medicine

## 2017-08-12 ENCOUNTER — Encounter: Payer: Self-pay | Admitting: Cardiovascular Disease

## 2017-08-12 ENCOUNTER — Encounter: Payer: Self-pay | Admitting: Internal Medicine

## 2017-08-12 ENCOUNTER — Other Ambulatory Visit (INDEPENDENT_AMBULATORY_CARE_PROVIDER_SITE_OTHER): Payer: 59

## 2017-08-12 ENCOUNTER — Ambulatory Visit (INDEPENDENT_AMBULATORY_CARE_PROVIDER_SITE_OTHER): Payer: 59 | Admitting: Cardiovascular Disease

## 2017-08-12 VITALS — BP 192/104 | HR 72 | Ht 70.0 in | Wt 210.2 lb

## 2017-08-12 DIAGNOSIS — I10 Essential (primary) hypertension: Secondary | ICD-10-CM | POA: Diagnosis not present

## 2017-08-12 DIAGNOSIS — E782 Mixed hyperlipidemia: Secondary | ICD-10-CM

## 2017-08-12 DIAGNOSIS — E669 Obesity, unspecified: Secondary | ICD-10-CM

## 2017-08-12 DIAGNOSIS — E7800 Pure hypercholesterolemia, unspecified: Secondary | ICD-10-CM | POA: Diagnosis not present

## 2017-08-12 DIAGNOSIS — E119 Type 2 diabetes mellitus without complications: Secondary | ICD-10-CM | POA: Diagnosis not present

## 2017-08-12 LAB — CBC WITH DIFFERENTIAL/PLATELET
BASOS ABS: 0.1 10*3/uL (ref 0.0–0.1)
BASOS PCT: 1.3 % (ref 0.0–3.0)
Eosinophils Absolute: 0.1 10*3/uL (ref 0.0–0.7)
Eosinophils Relative: 1.3 % (ref 0.0–5.0)
HEMATOCRIT: 39.7 % (ref 36.0–46.0)
Hemoglobin: 12.6 g/dL (ref 12.0–15.0)
LYMPHS PCT: 39.6 % (ref 12.0–46.0)
Lymphs Abs: 2.2 10*3/uL (ref 0.7–4.0)
MCHC: 31.7 g/dL (ref 30.0–36.0)
MCV: 81.1 fl (ref 78.0–100.0)
MONOS PCT: 6.5 % (ref 3.0–12.0)
Monocytes Absolute: 0.4 10*3/uL (ref 0.1–1.0)
NEUTROS ABS: 2.8 10*3/uL (ref 1.4–7.7)
Neutrophils Relative %: 51.3 % (ref 43.0–77.0)
PLATELETS: 328 10*3/uL (ref 150.0–400.0)
RBC: 4.89 Mil/uL (ref 3.87–5.11)
RDW: 16 % — ABNORMAL HIGH (ref 11.5–15.5)
WBC: 5.5 10*3/uL (ref 4.0–10.5)

## 2017-08-12 LAB — COMPREHENSIVE METABOLIC PANEL
ALT: 9 U/L (ref 0–35)
AST: 12 U/L (ref 0–37)
Albumin: 4.2 g/dL (ref 3.5–5.2)
Alkaline Phosphatase: 75 U/L (ref 39–117)
BILIRUBIN TOTAL: 0.5 mg/dL (ref 0.2–1.2)
BUN: 13 mg/dL (ref 6–23)
CHLORIDE: 107 meq/L (ref 96–112)
CO2: 29 meq/L (ref 19–32)
Calcium: 9.6 mg/dL (ref 8.4–10.5)
Creatinine, Ser: 1.01 mg/dL (ref 0.40–1.20)
GFR: 70.76 mL/min (ref >60.00)
Glucose, Bld: 96 mg/dL (ref 70–99)
Potassium: 4 mEq/L (ref 3.5–5.1)
Sodium: 144 mEq/L (ref 135–145)
Total Protein: 8.2 g/dL (ref 6.0–8.3)

## 2017-08-12 LAB — LIPID PANEL
Cholesterol: 187 mg/dL (ref 0–200)
HDL: 46.9 mg/dL (ref >39.00)
LDL Cholesterol: 123 mg/dL — ABNORMAL HIGH (ref 0–99)
NONHDL: 140.12
Total CHOL/HDL Ratio: 4
Triglycerides: 87 mg/dL (ref 0.0–149.0)
VLDL: 17.4 mg/dL (ref 0.0–40.0)

## 2017-08-12 LAB — TSH: TSH: 1.39 u[IU]/mL (ref 0.35–4.50)

## 2017-08-12 LAB — MICROALBUMIN / CREATININE URINE RATIO
CREATININE, U: 192 mg/dL
Microalb Creat Ratio: 3.8 mg/g (ref 0.0–30.0)
Microalb, Ur: 7.3 mg/dL — ABNORMAL HIGH (ref 0.0–1.9)

## 2017-08-12 LAB — HEMOGLOBIN A1C: Hgb A1c MFr Bld: 6.4 % (ref 4.6–6.5)

## 2017-08-12 MED ORDER — PRAVASTATIN SODIUM 40 MG PO TABS: 40.0000 mg | ORAL_TABLET | Freq: Every evening | ORAL | 3 refills | Status: DC

## 2017-08-12 NOTE — Progress Notes (Signed)
Cardiology Office Note   Date:  08/12/2017   ID:  Cynthia Forbes, DOB [birth year], MRN [id number]  PCP:  Binnie Rail, MD  Cardiologist:   Skeet Latch, MD   Chief Complaint  Patient presents with  . Follow-up      History of Present Illness: Cynthia Forbes is a 67 y.o. female hypertension, hyperlipidemia, diabetes, and mitral valve prolapse here for follow up.  She was first seen 06/25/16 for the evaluation of an abnormal EKG with anterolateral T wave inversions.  It was unchanged from 2015.  She has struggled with difficult to control BP.  At her last appointment hydralazine was increased and she was started on HCTZ/triamterene.  She followed up with our pharmacist on 08/26/2016 and her blood pressure remained elevated.  She ran out of her blood pressure medication for over a month and was unable to get it filled by her primary care provider.  She has started back taking all of them with the exception of the HCTZ/triamterene.  She had this filled yesterday but has not taken any yet.  She has not taken any blood pressure medications yet this morning.  In general she says her blood pressure at home is running from the 140s to the 170s.  When her blood pressure is not high she feels headaches and gets dizzy.  She notes that when she was taking her medication it was much better controlled.  She has had a lot of stress at work and has been working a lot of long hours.  She is not getting any exercise that she looks forward to doing more in the summer.  She also has not been taking her statin because she has been out of it for quite some time.  She has no chest pain or shortness of breath.  She also denies lower extremity edema, orthopnea, or PND.   Past Medical History:  Diagnosis Date  . Bronchitis   . Diabetes mellitus without complication ([place])   . Hyperlipemia   . Hypertension   . Kidney stones 2005 & 08/2011    passed spontaneously X 2    Past Surgical History:  Procedure  Laterality Date  . ABDOMINAL HYSTERECTOMY  10/05/11   Dr Nori Riis  . OOPHORECTOMY  10/05/11   degenerative fibroid; Dr Nori Riis  . TONSILLECTOMY    . vaginal bleed  10/19/11   hematoma; Dr Matthew Saras     Current Outpatient Medications  Medication Sig Dispense Refill  . albuterol (PROAIR HFA) 108 (90 Base) MCG/ACT inhaler Inhale 2 puffs into the lungs every 6 (six) hours as needed for wheezing or shortness of breath. Must keep appt for future refills 18 g 0  . aspirin 81 MG chewable tablet Chew 81 mg by mouth daily.    . Blood Pressure Monitor KIT 1 Package by Does not apply route daily. Check blood pressure as directed by pharmacist or physician. Diagnosis code I10 1 each 0  . carvedilol (COREG) 25 MG tablet Take 1 tablet (25 mg total) by mouth 2 (two) times daily with a meal. KEEP OV. 180 tablet 0  . hydrALAZINE (APRESOLINE) 50 MG tablet TAKE 1 TABLET BY MOUTH 3 TIMES A DAY. 90 tablet 3  . triamterene-hydrochlorothiazide (MAXZIDE-25) 37.5-25 MG tablet Take 1 tablet by mouth as directed. 1/2 TABLET BY MOUTH DAILY    . pravastatin (PRAVACHOL) 40 MG tablet Take 1 tablet (40 mg total) by mouth every evening. 90 tablet 3   No current facility-administered medications for  this visit.     Allergies:   Codeine; Ivp dye [iodinated diagnostic agents]; Benazepril; Benicar [olmesartan]; Clonidine derivatives; and Strawberry extract    Social History:  The patient  reports that she has never smoked. She has never used smokeless tobacco. She reports that she does not drink alcohol or use drugs.   Family History:  The patient's family history includes Alzheimer's disease in her mother; Cancer in her brother and mother; Diabetes in her father; Kidney disease in her father; Liver cancer in her maternal grandfather; Prostate cancer in her father.    ROS:  Please see the history of present illness.   Otherwise, review of systems are positive for none.   All other systems are reviewed and negative.    PHYSICAL  EXAM: VS:  BP (!) 192/104   Pulse 72   Ht _0  (1.778 m)   Wt 210 lb 3.2 oz (95.3 kg)   BMI 30.16 kg/m  , BMI Body mass index is 30.16 kg/m. GENERAL:  Well appearing.  Anxious HEENT:  Pupils equal round and reactive, fundi not visualized, oral mucosa unremarkable NECK:  No jugular venous distention, waveform within normal limits, carotid upstroke brisk and symmetric, no bruits LYMPHATICS:  No cervical adenopathy LUNGS:  Clear to auscultation bilaterally.  No crackles, rhonchi or wheezes HEART:  RRR.  PMI not displaced or sustained,S1 and S2 within normal limits, no S3, no S4, no clicks, no rubs, no murmurs ABD:  Flat, positive bowel sounds normal in frequency in pitch, no bruits, no rebound, no guarding, no midline pulsatile mass, no hepatomegaly, no splenomegaly EXT:  2 plus pulses throughout, no edema, no cyanosis no clubbing SKIN:  No rashes no nodules NEURO:  Cranial nerves II through XII grossly intact, motor grossly intact throughout PSYCH:  Cognitively intact, oriented to person place and time   EKG:  EKG is not ordered today. The ekg ordered 06/08/16 demonstrates sinus rhythm. Rate 68 bpm. Left anterior fascicular block. Anterolateral T wave inversions. 01/23/14:  Sinus rhythm. Rate 98 bpm. early R wave transition. Left anterior fascicular block. Anterolateral T wave inversions. 08/12/17: Sinus rhythm.  Rate 72 bpm.  RBBB.  LAFB.    Recent Labs: 08/12/2017: ALT 9; BUN 13; Creatinine, Ser 1.01; Hemoglobin 12.6; Platelets 328.0; Potassium 4.0; Sodium 144; TSH 1.39    Lipid Panel    Component Value Date/Time   CHOL 187 08/12/2017 0904   CHOL 174 09/30/2016 0835   TRIG 87.0 08/12/2017 0904   HDL 46.90 08/12/2017 0904   HDL 44 09/30/2016 0835   CHOLHDL 4 08/12/2017 0904   VLDL 17.4 08/12/2017 0904   LDLCALC 123 (H) 08/12/2017 0904   LDLCALC 110 (H) 09/30/2016 0835   LDLDIRECT 176.7 01/11/2013 0821      Wt Readings from Last 3 Encounters:  08/12/17 210 lb 3.2 oz (95.3  kg)  08/09/17 213 lb (96.6 kg)  07/21/16 214 lb (97.1 kg)      ASSESSMENT AND PLAN:  # Hypertension: Blood pressure  is very poorly controlled.  This is partially due to whitecoat hypertension as she has severe anxiety about coming to the doctor.  However it is also contributed to by not exercising and not taking her medications.  We discussed the importance of taking her medications as prescribed and getting them refilled on time.  She will go home and immediately take all of her antihypertensives.  She will check her blood pressure daily for 2 weeks and bring this back to her appointment.  #  Hyperlipidemia: C  Ms. Meske has not been taking her pravastatin.  We will resume it.  She will need repeat lipids and a CMP in 6 weeks.  # Obesity: Encouraged to start back exercising..   Current medicines are reviewed at length with the patient today.  The patient does not have concerns regarding medicines.  The following changes have been made:  none  Labs/ tests ordered today include:  No orders of the defined types were placed in this encounter.    Disposition:   FU with Kasen Sako C. Oval Linsey, MD, [facility] in 2 weeks.    Signed, Eusevio Schriver C. Oval Linsey, MD, [facility]  08/12/2017 12:45 PM    [place]

## 2017-08-12 NOTE — Patient Instructions (Signed)
Medication Instructions:  RESUME YOUR PRAVASTATIN 40 MG DAILY  MAKE SURE YOU ARE TAKING YOUR MEDICATIONS DAILY AS PRESCRIBED   Labwork: NONE  Testing/Procedures: NONE  Follow-Up: Your physician recommends that you schedule a follow-up appointment in: 08/26/17 AT 10:20 AM  Any Other Special Instructions Will Be Listed Below (If Applicable).  MONITOR YOUR BLOOD PRESSURE DAILY AND WRITE DOWN YOUR READINGS BRING WITH YOU WHEN YOU COME FOR FOLLOW UP   If you need a refill on your cardiac medications before your next appointment, please call your pharmacy.

## 2017-08-12 NOTE — Telephone Encounter (Signed)
Cologuard has been ordered. 

## 2017-08-12 NOTE — Telephone Encounter (Signed)
Patient states that she has checked with her insurance and that they do cover cologuard.

## 2017-08-13 ENCOUNTER — Encounter: Payer: Self-pay | Admitting: Internal Medicine

## 2017-08-17 LAB — COLOGUARD: COLOGUARD: NEGATIVE

## 2017-08-24 ENCOUNTER — Telehealth: Payer: Self-pay | Admitting: Cardiovascular Disease

## 2017-08-24 NOTE — Telephone Encounter (Signed)
Returned the call to the patient. She stated that she has been having dizzy spells and periods of weakness which she states is not new for her. This morning her blood pressure was 198/113 at home. When she got to work it was 142/98 (after her medication). Her home blood pressure machine is reading higher than the nurse's at work. She has been advised to bring her blood pressure machine to her appointment on 5/31.  She currently takes: Carvedilol 25 mg bid Hydralazine 50 mg tid Triamterene-hydrochlorothiazide she takes on and off depending on her leg cramps. She states that this medication gives her leg cramps.   The patient states that she does stay hydrated and drinks plenty of water throughout the day. She watches her sodium intake. She did state that she is unable to sleep lately and is exhausted. She has been advised to try and get some rest and find something that helps her to relax.

## 2017-08-24 NOTE — Telephone Encounter (Signed)
Pt c/o BP issue:  1. What are your last 5 BP readings? This morning it was 142/98 and this morning before she left home around 7:10,it was 192/133, yesterday at 10:20 it was 142/90 on Monday it was 142/91 2. Are you having any other symptoms (ex. Dizziness, headache, blurred vision, passed out)? Headaches that come and and weakness 3. What is your medication issue? Pt is taking Carvedilol  2 times a day

## 2017-08-25 NOTE — Telephone Encounter (Signed)
Left message to call back  

## 2017-08-25 NOTE — Telephone Encounter (Signed)
Please have her start amlodipine  daily.

## 2017-08-26 ENCOUNTER — Telehealth: Payer: Self-pay | Admitting: *Deleted

## 2017-08-26 ENCOUNTER — Ambulatory Visit (INDEPENDENT_AMBULATORY_CARE_PROVIDER_SITE_OTHER): Payer: 59 | Admitting: Cardiovascular Disease

## 2017-08-26 ENCOUNTER — Encounter: Payer: Self-pay | Admitting: Cardiovascular Disease

## 2017-08-26 VITALS — BP 142/96 | HR 90 | Ht 68.0 in | Wt 209.0 lb

## 2017-08-26 DIAGNOSIS — E669 Obesity, unspecified: Secondary | ICD-10-CM

## 2017-08-26 DIAGNOSIS — E7800 Pure hypercholesterolemia, unspecified: Secondary | ICD-10-CM

## 2017-08-26 DIAGNOSIS — I10 Essential (primary) hypertension: Secondary | ICD-10-CM | POA: Diagnosis not present

## 2017-08-26 MED ORDER — HYDROCHLOROTHIAZIDE 12.5 MG PO TABS: ORAL_TABLET | ORAL | 1 refills | Status: DC

## 2017-08-26 MED ORDER — AMLODIPINE BESYLATE 5 MG PO TABS: 5.0000 mg | ORAL_TABLET | Freq: Every day | ORAL | 1 refills | Status: DC

## 2017-08-26 NOTE — Progress Notes (Signed)
Cardiology Office Note   Date:  08/26/2017   ID:  Cynthia SHACKETT, DOB 1950/04/08, MRN [id number]  PCP:  Cynthia Rail, MD  Cardiologist:   Cynthia Latch, MD   No chief complaint on file.     History of Present Illness: Cynthia Forbes is a 67 y.o. female hypertension, hyperlipidemia, diabetes, and mitral valve prolapse here for follow up.  She was first seen 06/25/16 for the evaluation of an abnormal EKG with anterolateral T wave inversions.  It was unchanged from 2015.  She has struggled with difficult to control BP.  Hydralazine was increased and she was started on HCTZ/triamterene.  She followed up with our pharmacist on 08/26/2016 and her blood pressure remained elevated.  At her last appointment she was out of all of her blood pressure medications.  Her blood pressure at that time was 192/104.  She filled her medications and has been tracking her blood pressure at home.  It is been mostly in the 469F to 072U systolic.  She no longer has headache.  She reduced her diuretic to half a tablet every other day because it is making her go to the bathroom too much.  She called our office because her blood pressure remained elevated and she was having headaches.  Amlodipine was called into the pharmacy but she did not realize this and has not picked it up.  She has not had any chest pain or pressure.  Her breathing has been stable.  She has noticed that her blood pressure is very correlated to work stress.  Past Medical History:  Diagnosis Date  . Bronchitis   . Diabetes mellitus without complication ([place])   . Hyperlipemia   . Hypertension   . Kidney stones 2005 & 08/2011    passed spontaneously X 2    Past Surgical History:  Procedure Laterality Date  . ABDOMINAL HYSTERECTOMY  10/05/11   Dr Nori Riis  . OOPHORECTOMY  10/05/11   degenerative fibroid; Dr Nori Riis  . TONSILLECTOMY    . vaginal bleed  10/19/11   hematoma; Dr Matthew Saras     Current Outpatient Medications  Medication Sig Dispense  Refill  . albuterol (PROAIR HFA) 108 (90 Base) MCG/ACT inhaler Inhale 2 puffs into the lungs every 6 (six) hours as needed for wheezing or shortness of breath. Must keep appt for future refills 18 g 0  . aspirin 81 MG chewable tablet Chew 81 mg by mouth daily.    . Blood Pressure Monitor KIT 1 Package by Does not apply route daily. Check blood pressure as directed by pharmacist or physician. Diagnosis code I10 1 each 0  . carvedilol (COREG) 25 MG tablet Take 1 tablet (25 mg total) by mouth 2 (two) times daily with a meal. KEEP OV. 180 tablet 0  . hydrALAZINE (APRESOLINE) 50 MG tablet TAKE 1 TABLET BY MOUTH 3 TIMES A DAY. 90 tablet 3  . pravastatin (PRAVACHOL) 40 MG tablet Take 1 tablet (40 mg total) by mouth every evening. 90 tablet 3  . amLODipine (NORVASC) 5 MG tablet Take 1 tablet (5 mg total) by mouth daily. 90 tablet 1  . hydrochlorothiazide (HYDRODIURIL) 12.5 MG tablet TAKE 1/2 TABLET BY MOUTH DAILY 45 tablet 1   No current facility-administered medications for this visit.     Allergies:   Codeine; Ivp dye [iodinated diagnostic agents]; Benazepril; Benicar [olmesartan]; Clonidine derivatives; and Strawberry extract    Social History:  The patient  reports that she has never smoked. She has  never used smokeless tobacco. She reports that she does not drink alcohol or use drugs.   Family History:  The patient's family history includes Alzheimer's disease in her mother; Cancer in her brother and mother; Diabetes in her father; Kidney disease in her father; Liver cancer in her maternal grandfather; Prostate cancer in her father.    ROS:  Please see the history of present illness.   Otherwise, review of systems are positive for none.   All other systems are reviewed and negative.    PHYSICAL EXAM: VS:  BP (!) 142/96   Pulse 90   Ht '5\' 8"'  (1.727 m)   Wt 209 lb (94.8 kg)   SpO2 99%   BMI 31.78 kg/m  , BMI Body mass index is 31.78 kg/m. GENERAL:  Well appearing HEENT: Pupils equal  round and reactive, fundi not visualized, oral mucosa unremarkable NECK:  No jugular venous distention, waveform within normal limits, carotid upstroke brisk and symmetric, no bruits LUNGS:  Clear to auscultation bilaterally HEART:  RRR.  PMI not displaced or sustained,S1 and S2 within normal limits, no S3, no S4, no clicks, no rubs, no murmurs ABD:  Flat, positive bowel sounds normal in frequency in pitch, no bruits, no rebound, no guarding, no midline pulsatile mass, no hepatomegaly, no splenomegaly EXT:  2 plus pulses throughout, no edema, no cyanosis no clubbing SKIN:  No rashes no nodules NEURO:  Cranial nerves II through XII grossly intact, motor grossly intact throughout PSYCH:  Cognitively intact, oriented to person place and time   EKG:  EKG is not ordered today. The ekg ordered 06/08/16 demonstrates sinus rhythm. Rate 68 bpm. Left anterior fascicular block. Anterolateral T wave inversions. 01/23/14:  Sinus rhythm. Rate 98 bpm. early R wave transition. Left anterior fascicular block. Anterolateral T wave inversions. 08/12/17: Sinus rhythm.  Rate 72 bpm.  RBBB.  LAFB.    Recent Labs: 08/12/2017: ALT 9; BUN 13; Creatinine, Ser 1.01; Hemoglobin 12.6; Platelets 328.0; Potassium 4.0; Sodium 144; TSH 1.39    Lipid Panel    Component Value Date/Time   CHOL 187 08/12/2017 0904   CHOL 174 09/30/2016 0835   TRIG 87.0 08/12/2017 0904   HDL 46.90 08/12/2017 0904   HDL 44 09/30/2016 0835   CHOLHDL 4 08/12/2017 0904   VLDL 17.4 08/12/2017 0904   LDLCALC 123 (H) 08/12/2017 0904   LDLCALC 110 (H) 09/30/2016 0835   LDLDIRECT 176.7 01/11/2013 0821      Wt Readings from Last 3 Encounters:  08/26/17 209 lb (94.8 kg)  08/12/17 210 lb 3.2 oz (95.3 kg)  08/09/17 213 lb (96.6 kg)      ASSESSMENT AND PLAN:  # Hypertension: Blood pressure  is much better controlled but still elevated.  She will add amlodipine 5 mg daily.  If her blood pressure remains poorly controlled she can increase this  to 10 mg.  Given that her diuretic is causing her to go to the bathroom too much she has been reducing it to every other day.  We will reduce the dose to hydrochlorthiazide 6.25 mg and asked her to take it every day.  Continue carvedilol and hydralazine.  she was advised to continue trying to limit work stress if possible.  # Hyperlipidemia: Continue pravastatin.  Check lipids and CMP at follow-up.   # Obesity: Encouraged to start back exercising..   Current medicines are reviewed at length with the patient today.  The patient does not have concerns regarding medicines.  The following changes have been  made:  none  Labs/ tests ordered today include:  No orders of the defined types were placed in this encounter.    Disposition:   FU with Vonita Calloway C. Oval Linsey, MD, [facility] in 6 weeks.    Signed, Antwone Capozzoli C. Oval Linsey, MD, [facility]  08/26/2017 11:10 AM    [place]

## 2017-08-26 NOTE — Telephone Encounter (Signed)
Received call from patient regarding HCTZ phone in today. Dr Duke Salviaandolph wanted patient to start on HCTZ 12.5 mg 1/2 tablet daily. Per patient the pill is entirely too small to cut. Patient had decreased her Maxzide 37.5-25 mg to 1/2 every other day secondary to frequent urinating. Discussed with Roxine CaddyKristin A Pharm D ok to have patient take the full HCTZ 12.5 mg daily and hopefully without both ingredients she will not have the frequent urinating.  If she is having frequent urination after a week ok to decrease to every other day. Advised patient, verbalized understanding.

## 2017-08-26 NOTE — Patient Instructions (Addendum)
Medication Instructions:  STOP TRIAMTERENE   START HYDROCHLOROTHIAZIDE 12.5 MG 1/2 TABLET DAILY  START AMLODIPINE 5 MG DAILY   Labwork: NONE  Testing/Procedures: NONE  Follow-Up: Your physician recommends that you schedule a follow-up appointment in: 2 MONTHS   IF YOUR BLOOD PRESSURE REMAINS ABOVE 130/80 AFTER A COUPLE OF WEEKS INCREASE YOUR AMLODIPINE TO 10 MG DAILY   If you need a refill on your cardiac medications before your next appointment, please call your pharmacy.

## 2017-08-30 ENCOUNTER — Encounter: Payer: Self-pay | Admitting: Internal Medicine

## 2017-08-31 NOTE — Telephone Encounter (Signed)
Patient had office visit 5/31

## 2017-11-15 ENCOUNTER — Encounter: Payer: Self-pay | Admitting: Cardiovascular Disease

## 2017-11-15 ENCOUNTER — Ambulatory Visit (INDEPENDENT_AMBULATORY_CARE_PROVIDER_SITE_OTHER): Payer: 59 | Admitting: Cardiovascular Disease

## 2017-11-15 VITALS — BP 142/86 | HR 95 | Ht 68.75 in | Wt 210.0 lb

## 2017-11-15 DIAGNOSIS — E669 Obesity, unspecified: Secondary | ICD-10-CM

## 2017-11-15 DIAGNOSIS — I10 Essential (primary) hypertension: Secondary | ICD-10-CM

## 2017-11-15 DIAGNOSIS — E7800 Pure hypercholesterolemia, unspecified: Secondary | ICD-10-CM

## 2017-11-15 MED ORDER — CARVEDILOL 25 MG PO TABS: 25.0000 mg | ORAL_TABLET | Freq: Two times a day (BID) | ORAL | 3 refills | Status: DC

## 2017-11-15 MED ORDER — AMLODIPINE BESYLATE 10 MG PO TABS: 10.0000 mg | ORAL_TABLET | Freq: Every day | ORAL | 3 refills | Status: DC

## 2017-11-15 NOTE — Patient Instructions (Addendum)
Medication Instructions:  INCREASE YOUR AMLODIPINE TO 10 MG DAILY   Labwork: FASTING LABS SOON   Testing/Procedures: NONE  Follow-Up: Your physician recommends that you schedule a follow-up appointment in: 1 MONTH WITH PHARM D FOR BLOOD PRESSURE  Your physician recommends that you schedule a follow-up appointment in: 3 MONTHS [facility]   If you need a refill on your cardiac medications before your next appointment, please call your pharmacy.

## 2017-11-15 NOTE — Progress Notes (Signed)
Cardiology Office Note   Date:  11/15/2017   ID:  Cynthia Forbes, DOB June 28, 1950, MRN [id number]  PCP:  Binnie Rail, MD  Cardiologist:   Skeet Latch, MD   No chief complaint on file.     History of Present Illness: Cynthia Forbes is a 67 y.o. female hypertension, hyperlipidemia, diabetes, and mitral valve prolapse here for follow up.  She was first seen 06/25/16 for the evaluation of an abnormal EKG with anterolateral T wave inversions.  It was unchanged from 2015.  She has struggled with difficult to control BP.  Hydralazine was increased and she was started on HCTZ/triamterene.  She followed up with our pharmacist on 08/26/2016 and her blood pressure remained elevated.  At her last appointment she was out of all of her blood pressure medications.  Her blood pressure at that time was 192/104.  She filled her medications and has been tracking her blood pressure at home.  It is been mostly in the 694W to 546E systolic.  She no longer has headache.  She reduced her diuretic to half a tablet every other day because it is making her go to the bathroom too much.  She called our office because her blood pressure remained elevated and she was having headaches.  Amlodipine was called into the pharmacy but she did not realize this and has not picked it up.  She has not had any chest pain or pressure.  Her breathing has been stable.  She has noticed that her blood pressure is very correlated to work stress.  At her last appointment [name] had decided to reduce her diuretic to every other day because it is making her go to the bathroom too much.  We reduce the dose to 6.25 mg and asked her to take it daily.  We also added amlodipine 5 mg and asked her to increase to 10 mg if her blood pressure remained elevated.  She went on vacation and her BP was better.  It was running in the 130s-150s/80s.  Since returning home and it is been more elevated.  Is been mostly in the 160s over 90s.  She attributes  this to stress.  Her work environment has improved significantly but is still stressful.  She is not working out.  She had one episode of lower extremity edema after eating a salty meal but otherwise has not experienced this.  She is better able to tolerate the diuretic after reducing the dose.  She denies orthopnea or PND.  She has not any chest pain or pressure.   Past Medical History:  Diagnosis Date  . Bronchitis   . Diabetes mellitus without complication ([place])   . Hyperlipemia   . Hypertension   . Kidney stones 2005 & 08/2011    passed spontaneously X 2    Past Surgical History:  Procedure Laterality Date  . ABDOMINAL HYSTERECTOMY  10/05/11   Dr Nori Riis  . OOPHORECTOMY  10/05/11   degenerative fibroid; Dr Nori Riis  . TONSILLECTOMY    . vaginal bleed  10/19/11   hematoma; Dr Matthew Saras     Current Outpatient Medications  Medication Sig Dispense Refill  . albuterol (PROAIR HFA) 108 (90 Base) MCG/ACT inhaler Inhale 2 puffs into the lungs every 6 (six) hours as needed for wheezing or shortness of breath. Must keep appt for future refills 18 g 0  . amLODipine (NORVASC) 10 MG tablet Take 1 tablet (10 mg total) by mouth daily. 90 tablet 3  .  aspirin 81 MG chewable tablet Chew 81 mg by mouth daily.    . Blood Pressure Monitor KIT 1 Package by Does not apply route daily. Check blood pressure as directed by pharmacist or physician. Diagnosis code I10 1 each 0  . carvedilol (COREG) 25 MG tablet Take 1 tablet (25 mg total) by mouth 2 (two) times daily with a meal. 180 tablet 3  . hydrALAZINE (APRESOLINE) 50 MG tablet TAKE 1 TABLET BY MOUTH 3 TIMES A DAY. 90 tablet 3  . hydrochlorothiazide (HYDRODIURIL) 12.5 MG tablet Take 12.5 mg by mouth daily.    . pravastatin (PRAVACHOL) 40 MG tablet Take 40 mg by mouth daily.     No current facility-administered medications for this visit.     Allergies:   Codeine; Ivp dye [iodinated diagnostic agents]; Benazepril; Benicar [olmesartan]; Clonidine derivatives; and  Strawberry extract    Social History:  The patient  reports that she has never smoked. She has never used smokeless tobacco. She reports that she does not drink alcohol or use drugs.   Family History:  The patient's family history includes Alzheimer's disease in her mother; Cancer in her brother and mother; Diabetes in her father; Kidney disease in her father; Liver cancer in her maternal grandfather; Prostate cancer in her father.    ROS:  Please see the history of present illness.   Otherwise, review of systems are positive for none.   All other systems are reviewed and negative.    PHYSICAL EXAM: VS:  BP (!) 142/86   Pulse 95   Ht 5' 8.75" (1.746 m)   Wt 210 lb (95.3 kg)   SpO2 97%   BMI 31.24 kg/m  , BMI Body mass index is 31.24 kg/m. GENERAL:  Well appearing HEENT: Pupils equal round and reactive, fundi not visualized, oral mucosa unremarkable NECK:  No jugular venous distention, waveform within normal limits, carotid upstroke brisk and symmetric, no bruits LUNGS:  Clear to auscultation bilaterally HEART:  RRR.  PMI not displaced or sustained,S1 and S2 within normal limits, no S3, no S4, no clicks, no rubs, no murmurs ABD:  Flat, positive bowel sounds normal in frequency in pitch, no bruits, no rebound, no guarding, no midline pulsatile mass, no hepatomegaly, no splenomegaly EXT:  2 plus pulses throughout, no edema, no cyanosis no clubbing SKIN:  No rashes no nodules NEURO:  Cranial nerves II through XII grossly intact, motor grossly intact throughout PSYCH:  Cognitively intact, oriented to person place and time   EKG:  EKG is not ordered today. The ekg ordered 06/08/16 demonstrates sinus rhythm. Rate 68 bpm. Left anterior fascicular block. Anterolateral T wave inversions. 01/23/14:  Sinus rhythm. Rate 98 bpm. early R wave transition. Left anterior fascicular block. Anterolateral T wave inversions. 08/12/17: Sinus rhythm.  Rate 72 bpm.  RBBB.  LAFB.    Recent Labs: 08/12/2017:  ALT 9; BUN 13; Creatinine, Ser 1.01; Hemoglobin 12.6; Platelets 328.0; Potassium 4.0; Sodium 144; TSH 1.39    Lipid Panel    Component Value Date/Time   CHOL 187 08/12/2017 0904   CHOL 174 09/30/2016 0835   TRIG 87.0 08/12/2017 0904   HDL 46.90 08/12/2017 0904   HDL 44 09/30/2016 0835   CHOLHDL 4 08/12/2017 0904   VLDL 17.4 08/12/2017 0904   LDLCALC 123 (H) 08/12/2017 0904   LDLCALC 110 (H) 09/30/2016 0835   LDLDIRECT 176.7 01/11/2013 0821      Wt Readings from Last 3 Encounters:  11/15/17 210 lb (95.3 kg)  08/26/17 209  lb (94.8 kg)  08/12/17 210 lb 3.2 oz (95.3 kg)      ASSESSMENT AND PLAN:  # Hypertension: Blood pressure remains elevated but is getting better.  We will increase amlodipine to 58m.  Continue carvedilol, HCTZ, and hydralazine.  Continue salt restriction.   # Hyperlipidemia: Continue pravastatin.  Repeat lipids and CMP given that she was started on pravastatin.   # Obesity: Encouraged to start back exercising.    Current medicines are reviewed at length with the patient today.  The patient does not have concerns regarding medicines.  The following changes have been made:  none  Labs/ tests ordered today include:   Orders Placed This Encounter  Procedures  . Lipid panel  . Comprehensive metabolic panel     Disposition:   FU with Tiffany C. ROval Linsey MD, [facility].  PharmD in 1 month.    Signed, Tiffany C. ROval Linsey MD, [facility] 11/15/2017 8:42 AM    [place]

## 2017-12-16 ENCOUNTER — Ambulatory Visit: Payer: 59

## 2017-12-22 ENCOUNTER — Ambulatory Visit: Payer: 59

## 2017-12-22 LAB — COMPREHENSIVE METABOLIC PANEL
ALBUMIN: 4.4 g/dL (ref 3.6–4.8)
ALT: 12 IU/L (ref 0–32)
AST: 11 IU/L (ref 0–40)
Albumin/Globulin Ratio: 1.4 (ref 1.2–2.2)
Alkaline Phosphatase: 92 IU/L (ref 39–117)
BUN / CREAT RATIO: 13 (ref 12–28)
BUN: 12 mg/dL (ref 8–27)
Bilirubin Total: 0.4 mg/dL (ref 0.0–1.2)
CALCIUM: 9.3 mg/dL (ref 8.7–10.3)
CO2: 23 mmol/L (ref 20–29)
CREATININE: 0.89 mg/dL (ref 0.57–1.00)
Chloride: 107 mmol/L — ABNORMAL HIGH (ref 96–106)
GFR, EST AFRICAN AMERICAN: 79 mL/min/{1.73_m2} (ref >59)
GFR, EST NON AFRICAN AMERICAN: 68 mL/min/{1.73_m2} (ref >59)
GLUCOSE: 107 mg/dL — AB (ref 65–99)
Globulin, Total: 3.1 g/dL (ref 1.5–4.5)
Potassium: 4.1 mmol/L (ref 3.5–5.2)
Sodium: 144 mmol/L (ref 134–144)
TOTAL PROTEIN: 7.5 g/dL (ref 6.0–8.5)

## 2017-12-22 LAB — LIPID PANEL
Chol/HDL Ratio: 3.8 ratio (ref 0.0–4.4)
Cholesterol, Total: 181 mg/dL (ref 100–199)
HDL: 48 mg/dL (ref >39)
LDL Calculated: 118 mg/dL — ABNORMAL HIGH (ref 0–99)
Triglycerides: 73 mg/dL (ref 0–149)
VLDL Cholesterol Cal: 15 mg/dL (ref 5–40)

## 2017-12-22 NOTE — Progress Notes (Deleted)
Patient ID: Cynthia Forbes                 DOB: [birth year]                      MRN: [id number]     HPI: Cynthia Forbes is a 67 y.o. female referred by Dr. Oval Linsey to HTN clinic. PMH include   Current HTN meds:   Previously tried:   BP goal: <130/80  Family History:   Social History:   Diet:   Exercise:   Home BP readings:   Wt Readings from Last 3 Encounters:  11/15/17 210 lb (95.3 kg)  08/26/17 209 lb (94.8 kg)  08/12/17 210 lb 3.2 oz (95.3 kg)   BP Readings from Last 3 Encounters:  11/15/17 (!) 142/86  08/26/17 (!) 142/96  08/12/17 (!) 192/104   Pulse Readings from Last 3 Encounters:  11/15/17 95  08/26/17 90  08/12/17 72    Renal function: CrCl cannot be calculated (Patient's most recent lab result is older than the maximum 21 days allowed.).  Past Medical History:  Diagnosis Date  . Bronchitis   . Diabetes mellitus without complication ([place])   . Hyperlipemia   . Hypertension   . Kidney stones 2005 & 08/2011    passed spontaneously X 2    Current Outpatient Medications on File Prior to Visit  Medication Sig Dispense Refill  . albuterol (PROAIR HFA) 108 (90 Base) MCG/ACT inhaler Inhale 2 puffs into the lungs every 6 (six) hours as needed for wheezing or shortness of breath. Must keep appt for future refills 18 g 0  . amLODipine (NORVASC) 10 MG tablet Take 1 tablet (10 mg total) by mouth daily. 90 tablet 3  . aspirin 81 MG chewable tablet Chew 81 mg by mouth daily.    . Blood Pressure Monitor KIT 1 Package by Does not apply route daily. Check blood pressure as directed by pharmacist or physician. Diagnosis code I10 1 each 0  . carvedilol (COREG) 25 MG tablet Take 1 tablet (25 mg total) by mouth 2 (two) times daily with a meal. 180 tablet 3  . hydrALAZINE (APRESOLINE) 50 MG tablet TAKE 1 TABLET BY MOUTH 3 TIMES A DAY. 90 tablet 3  . hydrochlorothiazide (HYDRODIURIL) 12.5 MG tablet Take 12.5 mg by mouth daily.    . pravastatin (PRAVACHOL) 40 MG tablet Take  40 mg by mouth daily.     No current facility-administered medications on file prior to visit.     Allergies  Allergen Reactions  . Codeine     Rash , tachycardia & dyspnea Able to take Percocet  . Ivp Dye [Iodinated Diagnostic Agents] Hives and Shortness Of Breath  . Benazepril     Heart racing, sob  . Benicar [Olmesartan]     Hair loss D/Ced after samples  . Clonidine Derivatives     See 08/07/13 dizziness, dry mouth/eyes, insomnia  . Strawberry Extract Hives    There were no vitals taken for this visit.  No problem-specific Assessment & Plan notes found for this encounter.   Emanuelle Bastos Rodriguez-Guzman PharmD, BCPS, CPP [place] [id number] 12/22/2017 7:10 AM

## 2017-12-23 ENCOUNTER — Ambulatory Visit (INDEPENDENT_AMBULATORY_CARE_PROVIDER_SITE_OTHER): Payer: 59 | Admitting: Pharmacist Clinician (PhC)/ Clinical Pharmacy Specialist

## 2017-12-23 DIAGNOSIS — I10 Essential (primary) hypertension: Secondary | ICD-10-CM

## 2017-12-23 NOTE — Progress Notes (Signed)
12/23/2017 Cynthia Forbes 1951-01-22 [id number]   HPI:  Cynthia Forbes is a 67 y.o. female patient of Dr Oval Linsey, with a [place] below who presents today for hypertension clinic evaluation. PMH includes HTN, HLD, and diabetes. Patient was last seen by Dr. Oval Linsey on 8/20 with a BP above goal at 142/86. Her amlodipine was increased to 10 mg at that time.    Patient admits to not liking medications.  She has been on the carvedilol and hydralazine for some time and is "used" to them, but she doesn't like the amlodipine and will take at varying times of the day, as she believes it causes some upset stomach and nausea.  She also does not take the hctz regularly, as she states it makes her run to the bathroom every 30 minutes, even when on the 6.25 mg dose.  The tablets are too small to cut in half, so she just takes one every few days.  She believes much of her increase in BP is due to work stressors, as well as some measure of white coat hypertension.    Blood Pressure Goal:  130/80  Current Medications:  Amlodipine 10 mg daily - doesn't like, makes her nauseated HCTZ 6.25 mg daily - takes every 2-3 days Carvedilol 25 BID Hydralazine 50 mg TID  Family Hx: both parents, brothers all with hypertension  Mother had ministroke several years before she died from dementia (52)  Father died from kidney disease (32)  1 brother died from brain aneurysm  Social Hx: Never a smoker and no alcohol; drinks only decaf, rare regular coffee  Diet: oatmeal for breakfast, lunch is sandwiches or eating out; ltends to eat a light dinner; snacks on popcorn, bananas during the day  Exercise: walks on weekends - more meandering (window shopping, [place]); has exercise bike, will ride occasionally  Home BP readings: brought home cuff to MD appt last month - was told works well.  Has checked a few times, but brought nothing with her.    Intolerances: benazepril caused her heart to race, shortness of breath    olmesartan made her hair fall out  Labs:   12/22/17:  Na 144, K 4.1, Glu 107, BUN 12, SCr 0.89  Wt Readings from Last 3 Encounters:  11/15/17 210 lb (95.3 kg)  08/26/17 209 lb (94.8 kg)  08/12/17 210 lb 3.2 oz (95.3 kg)   BP Readings from Last 3 Encounters:  12/23/17 (!) 152/86  11/15/17 (!) 142/86  08/26/17 (!) 142/96   Pulse Readings from Last 3 Encounters:  12/23/17 74  11/15/17 95  08/26/17 90    Current Outpatient Medications  Medication Sig Dispense Refill  . albuterol (PROAIR HFA) 108 (90 Base) MCG/ACT inhaler Inhale 2 puffs into the lungs every 6 (six) hours as needed for wheezing or shortness of breath. Must keep appt for future refills 18 g 0  . amLODipine (NORVASC) 10 MG tablet Take 1 tablet (10 mg total) by mouth daily. 90 tablet 3  . aspirin 81 MG chewable tablet Chew 81 mg by mouth daily.    . Blood Pressure Monitor KIT 1 Package by Does not apply route daily. Check blood pressure as directed by pharmacist or physician. Diagnosis code I10 1 each 0  . carvedilol (COREG) 25 MG tablet Take 1 tablet (25 mg total) by mouth 2 (two) times daily with a meal. 180 tablet 3  . hydrALAZINE (APRESOLINE) 50 MG tablet TAKE 1 TABLET BY MOUTH 3 TIMES A DAY.  90 tablet 3  . hydrochlorothiazide (HYDRODIURIL) 12.5 MG tablet Take 12.5 mg by mouth daily.    . pravastatin (PRAVACHOL) 40 MG tablet Take 40 mg by mouth daily.     No current facility-administered medications for this visit.     Allergies  Allergen Reactions  . Codeine     Rash , tachycardia & dyspnea Able to take Percocet  . Ivp Dye [Iodinated Diagnostic Agents] Hives and Shortness Of Breath  . Benazepril     Heart racing, sob  . Benicar [Olmesartan]     Hair loss D/Ced after samples  . Clonidine Derivatives     See 08/07/13 dizziness, dry mouth/eyes, insomnia  . Strawberry Extract Hives    Past Medical History:  Diagnosis Date  . Bronchitis   . Diabetes mellitus without complication ([place])   . Hyperlipemia    . Hypertension   . Kidney stones 2005 & 08/2011    passed spontaneously X 2    Blood pressure (!) 152/86, pulse 74. right arm: 148/90  Essential hypertension Patient with hypertension, still not controlled in the office.  She is quite hesitant to take medications, and does not like 2 of the 4 she currently is on.  Explained that she would have to make some serious lifestyle modifications in order to avoid medication, and she would like to give that a try.  Had detailed discussion about getting on her exercise bike daily for 15-20 minutes and increasing her activity over the weeks.  She would also need to do more strenuous walking (not window shopping) or re-join the gym.  Also reviewed sodium intake and monitoring her diet to include more vegetables and fewer prepared foods or lunch meats.  She is seeing Dr. Oval Linsey in 6 weeks, so I gave her that time to get her plan together and start implementing.  She will continue with her current medications, although I did suggest that she can divide her amlodipine to 1/2 tablet twice daily if that helps with her nausea, and she will need to check home BP regularly (2-3 times per week, twice a day) and bring a log of home readings to her next appointment.   Also discussed option of re-challenging with [facility] if her pressure not controlled in the near future.  Alopecia is not a common side effect and she might do well to have valsartan.     Tommy Medal PharmD CPP [facility] [address] [place], [place] [address] [phone]

## 2017-12-23 NOTE — Patient Instructions (Addendum)
Return for a a follow up appointment with Dr. Duke Salvia in November  Your blood pressure today is 152/86  Check your blood pressure at home daily and keep record of the readings.  Take your BP meds as follows:  Continue with your current medications    Bring all of your meds, your BP cuff and your record of home blood pressures to your next appointment.  Exercise as you're able, try to walk approximately 30 minutes per day.  Keep salt intake to a minimum, especially watch canned and prepared boxed foods.  Eat more fresh fruits and vegetables and fewer canned items.  Avoid eating in fast food restaurants.    HOW TO TAKE YOUR BLOOD PRESSURE: . Rest 5 minutes before taking your blood pressure. .  Don't smoke or drink caffeinated beverages for at least 30 minutes before. . Take your blood pressure before (not after) you eat. . Sit comfortably with your back supported and both feet on the floor (don't cross your legs). . Elevate your arm to heart level on a table or a desk. . Use the proper sized cuff. It should fit smoothly and snugly around your bare upper arm. There should be enough room to slip a fingertip under the cuff. The bottom edge of the cuff should be 1 inch above the crease of the elbow. . Ideally, take 3 measurements at one sitting and record the average.

## 2017-12-23 NOTE — Assessment & Plan Note (Signed)
Patient with hypertension, still not controlled in the office.  She is quite hesitant to take medications, and does not like 2 of the 4 she currently is on.  Explained that she would have to make some serious lifestyle modifications in order to avoid medication, and she would like to give that a try.  Had detailed discussion about getting on her exercise bike daily for 15-20 minutes and increasing her activity over the weeks.  She would also need to do more strenuous walking (not window shopping) or re-join the gym.  Also reviewed sodium intake and monitoring her diet to include more vegetables and fewer prepared foods or lunch meats.  She is seeing Dr. Duke Salvia in 6 weeks, so I gave her that time to get her plan together and start implementing.  She will continue with her current medications, although I did suggest that she can divide her amlodipine to 1/2 tablet twice daily if that helps with her nausea, and she will need to check home BP regularly (2-3 times per week, twice a day) and bring a log of home readings to her next appointment.   Also discussed option of re-challenging with [facility] if her pressure not controlled in the near future.  Alopecia is not a common side effect and she might do well to have valsartan.

## 2017-12-26 ENCOUNTER — Other Ambulatory Visit: Payer: Self-pay

## 2017-12-26 DIAGNOSIS — E7800 Pure hypercholesterolemia, unspecified: Secondary | ICD-10-CM

## 2017-12-26 DIAGNOSIS — Z79899 Other long term (current) drug therapy: Secondary | ICD-10-CM

## 2017-12-26 MED ORDER — ROSUVASTATIN CALCIUM 20 MG PO TABS: 20.0000 mg | ORAL_TABLET | Freq: Every day | ORAL | 3 refills | Status: DC

## 2017-12-26 NOTE — Progress Notes (Signed)
Notes recorded by Chilton Si, MD on 12/23/2017 at 1:45 PM EDT Cholesterol is only minimally improved. Stop pravastatin and start rosuvastatin 20mg  instead. Repeat lipids/CMP in 6-8 weeks. Normal kidney function and electrolytes

## 2017-12-29 ENCOUNTER — Telehealth: Payer: Self-pay | Admitting: Cardiovascular Disease

## 2017-12-29 NOTE — Telephone Encounter (Signed)
Returned call to patient she stated she did not feel like eating this morning.She only took hydralazine did not take other B/P meds.B/P elevated 145/100.Advised to eat something and take other B/P medications.Advised to relax and check B/P again in a couple of hours.Advised to call back if B/P continues to be elevated.

## 2017-12-29 NOTE — Telephone Encounter (Signed)
° °  Pt c/o BP issue: STAT if pt c/o blurred vision, one-sided weakness or slurred speech  1. What are your last 5 BP readings? 135/91, 142/100, 145/102, 109/55, 129/88  2. Are you having any other symptoms (ex. Dizziness, headache, blurred vision, passed out)? dizziness  3. What is your BP issue? Patient calling to report BP, episode of chest pain    1. Are you having CP right now? NO  2. Are you experiencing any other symptoms (ex. SOB, nausea, vomiting, sweating)? no  3. How long have you been experiencing CP? today  4. Is your CP continuous or coming and going? Lasted only a few seconds  5. Have you taken Nitroglycerin? No ?

## 2018-01-17 ENCOUNTER — Telehealth: Payer: Self-pay

## 2018-01-17 NOTE — Telephone Encounter (Signed)
Returned call to patient Cynthia Forbes's recommendation given.Advised to keep appointment with Dr.[place] as planned, call sooner if needed.

## 2018-01-17 NOTE — Telephone Encounter (Signed)
Spoke to patient about email sent this morning.She stated she has been checking B/P and has had 3 low readings.Stated when B/P drops she feels extremely weak.Stated she is afraid to take medications. B/P has been ranging as follows 130/75,117/70,122/70,144/93,129/73,106/69,107/52,112/72,104/74.Stated at present 155/98.Stated she is nervous this morning.Advised ok to take medications as prescribed this morning.I will send message to our pharmacist for advice.

## 2018-01-17 NOTE — Telephone Encounter (Signed)
Blood pressure readings are good . Last medication adjustments was more than  2 months ago.    Recommendation:  1. Take 1/2 amlodipine in AM and 1/2 in PM to increase tolerability (as previously recommended)  2. Stay hydrated  3. Monitor BP no more  2 times per day (morning and evening) and keep records

## 2018-02-08 NOTE — Progress Notes (Signed)
Cardiology Office Note   Date:  02/09/2018   ID:  Cynthia Forbes, DOB 10-15-1950, MRN [id number]  PCP:  Cynthia Rail, MD  Cardiologist:   Cynthia Latch, MD   Chief Complaint  Patient presents with  . Hypertension      History of Present Illness: Cynthia Forbes is a 67 y.o. female hypertension, hyperlipidemia, diabetes, and mitral valve prolapse here for follow up.  She was first seen 06/25/16 for the evaluation of an abnormal EKG with anterolateral T wave inversions.  It was unchanged from 2015.  She has struggled with difficult to control BP.  Hydralazine was increased and she was started on HCTZ/triamterene.  She followed up with our pharmacist on 08/26/2016 and her blood pressure remained elevated.  At her last appointment she was out of all of her blood pressure medications.  Her blood pressure at that time was 192/104.  She filled her medications and has been tracking her blood pressure at home.  It is been mostly in the 562B to 638L systolic.  She no longer has headache.  She reduced her diuretic to half a tablet every other day because it is making her go to the bathroom too much.  She called our office because her blood pressure remained elevated and she was having headaches.  Amlodipine was called into the pharmacy but she did not realize this and has not picked it up.  She has not had any chest pain or pressure.  Her breathing has been stable.  She has noticed that her blood pressure is very correlated to work stress.  At her last appointment [name] reported increased urinary frequency.  HCTZ was reduced and she was started on amlodipine.  She saw our pharmacists and reported nausea with amlodipne.  She was advised to split the dose to twice daily.   She had some episodes of low blood pressure and feeling nauseous.  This improved with splitting the amlodipine.  She keeps close records of her blood pressures.  It has ranged between 90 and 147 over 60s to 80s.  Most of her blood  pressures are in the 110s to 120s.  She has been feeling well.  She is made a lot of changes in her diet, she however she feels hungry often because she really does not know what she can eat.  She is mostly drinking fluids instead.  She reports increased urinary frequency which makes it difficult for her to get throughout her day at work.  She has no lower extremity edema, orthopnea, or PND.  She has been exercising and has no exertional chest pain or shortness of breath.   Past Medical History:  Diagnosis Date  . Bronchitis   . Diabetes mellitus without complication ([place])   . Hyperlipemia   . Hypertension   . Kidney stones 2005 & 08/2011    passed spontaneously X 2    Past Surgical History:  Procedure Laterality Date  . ABDOMINAL HYSTERECTOMY  10/05/11   Dr Cynthia Forbes  . OOPHORECTOMY  10/05/11   degenerative fibroid; Dr Cynthia Forbes  . TONSILLECTOMY    . vaginal bleed  10/19/11   hematoma; Dr Cynthia Forbes     Current Outpatient Medications  Medication Sig Dispense Refill  . albuterol (PROAIR HFA) 108 (90 Base) MCG/ACT inhaler Inhale 2 puffs into the lungs every 6 (six) hours as needed for wheezing or shortness of breath. Must keep appt for future refills 18 g 0  . amLODipine (NORVASC) 10 MG tablet  Take 1/2 tablet twice a day 180 tablet 3  . aspirin 81 MG chewable tablet Chew 81 mg by mouth daily.    . Blood Pressure Monitor KIT 1 Package by Does not apply route daily. Check blood pressure as directed by pharmacist or physician. Diagnosis code I10 1 each 0  . carvedilol (COREG) 25 MG tablet Take 1 tablet (25 mg total) by mouth 2 (two) times daily with a meal. 180 tablet 3  . hydrALAZINE (APRESOLINE) 50 MG tablet TAKE 1 TABLET BY MOUTH 3 TIMES A DAY. 90 tablet 3  . hydrochlorothiazide (HYDRODIURIL) 12.5 MG tablet Take 12.5 mg by mouth daily.    . rosuvastatin (CRESTOR) 20 MG tablet Take 1 tablet (20 mg total) by mouth daily. 90 tablet 3   No current facility-administered medications for this visit.      Allergies:   Codeine; Ivp dye [iodinated diagnostic agents]; Benazepril; Benicar [olmesartan]; Clonidine derivatives; and Strawberry extract    Social History:  The patient  reports that she has never smoked. She has never used smokeless tobacco. She reports that she does not drink alcohol or use drugs.   Family History:  The patient's family history includes Alzheimer's disease in her mother; Cancer in her brother and mother; Diabetes in her father; Kidney disease in her father; Liver cancer in her maternal grandfather; Prostate cancer in her father.    ROS:  Please see the history of present illness.   Otherwise, review of systems are positive for none.   All other systems are reviewed and negative.    PHYSICAL EXAM: VS:  BP 120/86   Pulse 84   Resp 16   Ht 5' 8.75" (1.746 m)   Wt 208 lb (94.3 kg)   SpO2 97%   BMI 30.94 kg/m  , BMI Body mass index is 30.94 kg/m. GENERAL:  Well appearing HEENT: Pupils equal round and reactive, fundi not visualized, oral mucosa unremarkable NECK:  No jugular venous distention, waveform within normal limits, carotid upstroke brisk and symmetric, no bruits LUNGS:  Clear to auscultation bilaterally HEART:  RRR.  PMI not displaced or sustained,S1 and S2 within normal limits, no S3, no S4, no clicks, no rubs, no murmurs ABD:  Flat, positive bowel sounds normal in frequency in pitch, no bruits, no rebound, no guarding, no midline pulsatile mass, no hepatomegaly, no splenomegaly EXT:  2 plus pulses throughout, no edema, no cyanosis no clubbing SKIN:  No rashes no nodules NEURO:  Cranial nerves II through XII grossly intact, motor grossly intact throughout PSYCH:  Cognitively intact, oriented to person place and time   EKG:  EKG is ordered today. The ekg ordered 06/08/16 demonstrates sinus rhythm. Rate 68 bpm. Left anterior fascicular block. Anterolateral T wave inversions. 01/23/14:  Sinus rhythm. Rate 98 bpm. early R wave transition. Left anterior  fascicular block. Anterolateral T wave inversions. 08/12/17: Sinus rhythm.  Rate 72 bpm.  RBBB.  LAFB.   02/09/18: Sinus rhythm.  Rate 80 bpm.  RBBB.  LAFBN.  Anterolateral T wave inversions unchanged.  Recent Labs: 08/12/2017: Hemoglobin 12.6; Platelets 328.0; TSH 1.39 12/22/2017: ALT 12; BUN 12; Creatinine, Ser 0.89; Potassium 4.1; Sodium 144    Lipid Panel    Component Value Date/Time   CHOL 181 12/22/2017 0815   TRIG 73 12/22/2017 0815   HDL 48 12/22/2017 0815   CHOLHDL 3.8 12/22/2017 0815   CHOLHDL 4 08/12/2017 0904   VLDL 17.4 08/12/2017 0904   LDLCALC 118 (H) 12/22/2017 0815   LDLDIRECT 176.7  01/11/2013 0821      Wt Readings from Last 3 Encounters:  02/09/18 208 lb (94.3 kg)  11/15/17 210 lb (95.3 kg)  08/26/17 209 lb (94.8 kg)      ASSESSMENT AND PLAN:  # Hypertension: Blood pressure  is much better controlled.  Her log from home shows that most of her readings are within range.  Continue amlodipine, carvedilol, hydralazine, and HCTZ.  She does have increased urinary frequency.  She denies dysuria or hematuria.  There is no foul odor and she does not think she has a urinary tract infection.  She has been trying to increase her fluids given that she is limiting her diet.  I think that much of her urinary frequency is her increased fluid intake.  We talked about limiting her fluids to around 2 to 3 L/day.  Given that her blood pressure so much better controlled she does not want to make any other changes to her regimen at this time.  # Hyperlipidemia: Pravastatin was switched to rosuvastatin.  She is due to have lipids and a CMP checked today.  # Obesity: Ms. Jablon was congratulated on starting back with exercise and for working on her diet.  She sees her PCP next week and needs a hemoglobin A1c checked.  We will do that today as well.  She was referred to nutrition counseling to help with her diabetes control and weight loss efforts.  Current medicines are reviewed at length  with the patient today.  The patient does not have concerns regarding medicines.  The following changes have been made:  none  Labs/ tests ordered today include:   Orders Placed This Encounter  Procedures  . Lipid panel  . Comprehensive metabolic panel  . HgB A1c  . Ambulatory referral to Nutrition and Diabetic Education  . EKG 12-Lead     Disposition:   FU with Alicyn Klann C. Oval Linsey, MD, [facility] in 6 months.    Signed, Cj Edgell C. Oval Linsey, MD, [facility]  02/09/2018 9:10 AM    [place]

## 2018-02-09 ENCOUNTER — Encounter: Payer: Self-pay | Admitting: Cardiovascular Disease

## 2018-02-09 ENCOUNTER — Ambulatory Visit (INDEPENDENT_AMBULATORY_CARE_PROVIDER_SITE_OTHER): Payer: Medicare Other | Admitting: Cardiovascular Disease

## 2018-02-09 VITALS — BP 120/86 | HR 84 | Resp 16 | Ht 68.75 in | Wt 208.0 lb

## 2018-02-09 DIAGNOSIS — I10 Essential (primary) hypertension: Secondary | ICD-10-CM

## 2018-02-09 DIAGNOSIS — E119 Type 2 diabetes mellitus without complications: Secondary | ICD-10-CM | POA: Diagnosis not present

## 2018-02-09 DIAGNOSIS — E7800 Pure hypercholesterolemia, unspecified: Secondary | ICD-10-CM

## 2018-02-09 DIAGNOSIS — Z79899 Other long term (current) drug therapy: Secondary | ICD-10-CM | POA: Diagnosis not present

## 2018-02-09 NOTE — Patient Instructions (Signed)
Medication Instructions:  Your physician recommends that you continue on your current medications as directed. Please refer to the Current Medication list given to you today.  If you need a refill on your cardiac medications before your next appointment, please call your pharmacy.   Lab work: LP/CMET/A1C TODAY  If you have labs (blood work) drawn today and your tests are completely normal, you will receive your results only by: Marland Kitchen. MyChart Message (if you have MyChart) OR . A paper copy in the mail If you have any lab test that is abnormal or we need to change your treatment, we will call you to review the results.  Testing/Procedures: NONE  Follow-Up: At [facility], you and your health needs are our priority.  As part of our continuing mission to provide you with exceptional heart care, we have created designated Provider Care Teams.  These Care Teams include your primary Cardiologist (physician) and Advanced Practice Providers (APPs -  Physician Assistants and Nurse Practitioners) who all work together to provide you with the care you need, when you need it. You will need a follow up appointment in 6 months.  Please call our office 2 months in advance to schedule this appointment.  You may see DR [facility]  or one of the following Advanced Practice Providers on your designated Care Team:   Corine ShelterLuke Kilroy, PA-C Judy PimpleKrista Kroeger, [place] JerseyPA-C . Marjie Skiffallie Goodrich, PA-C  You have been referred to [facility]. IF YOU DO NOT HEAR FROM THE OFFICE BY MID NEXT WEEK YOU MAY CALL THEM DIRECTLY AT [phone]

## 2018-02-10 LAB — COMPREHENSIVE METABOLIC PANEL
ALK PHOS: 80 IU/L (ref 39–117)
ALT: 12 IU/L (ref 0–32)
AST: 14 IU/L (ref 0–40)
Albumin/Globulin Ratio: 1.5 (ref 1.2–2.2)
Albumin: 4.6 g/dL (ref 3.6–4.8)
BUN / CREAT RATIO: 18 (ref 12–28)
BUN: 17 mg/dL (ref 8–27)
Bilirubin Total: 0.3 mg/dL (ref 0.0–1.2)
CALCIUM: 9.8 mg/dL (ref 8.7–10.3)
CHLORIDE: 106 mmol/L (ref 96–106)
CO2: 23 mmol/L (ref 20–29)
Creatinine, Ser: 0.97 mg/dL (ref 0.57–1.00)
GFR calc Af Amer: 71 mL/min/{1.73_m2} (ref >59)
GFR calc non Af Amer: 61 mL/min/{1.73_m2} (ref >59)
GLUCOSE: 98 mg/dL (ref 65–99)
Globulin, Total: 3 g/dL (ref 1.5–4.5)
Potassium: 4.1 mmol/L (ref 3.5–5.2)
SODIUM: 142 mmol/L (ref 134–144)
Total Protein: 7.6 g/dL (ref 6.0–8.5)

## 2018-02-10 LAB — LIPID PANEL
CHOLESTEROL TOTAL: 129 mg/dL (ref 100–199)
Chol/HDL Ratio: 2.6 ratio (ref 0.0–4.4)
HDL: 49 mg/dL (ref >39)
LDL CALC: 68 mg/dL (ref 0–99)
Triglycerides: 60 mg/dL (ref 0–149)
VLDL CHOLESTEROL CAL: 12 mg/dL (ref 5–40)

## 2018-02-10 LAB — HEMOGLOBIN A1C
Est. average glucose Bld gHb Est-mCnc: 134 mg/dL
HEMOGLOBIN A1C: 6.3 % — AB (ref 4.8–5.6)

## 2018-02-11 NOTE — Progress Notes (Signed)
Subjective:    Patient ID: Cynthia Forbes, female    DOB: 01-28-1951, 67 y.o.   MRN: [id number]  HPI The patient is here for follow up.  Hypertension: She is taking her medication daily. She is compliant with a low sodium diet.  She denies chest pain, palpitations, edema, shortness of breath and regular headaches. She is exercising regularly - exercise bikea t home.  She does monitor her blood pressure at home - it is well controlled.    Diabetes: She is taking her medication daily as prescribed. She is compliant with a diabetic diet. She is exercising regularly. She monitors her sugars and they have been running XXX. She checks her feet daily and denies foot lesions. She is up-to-date with an ophthalmology examination.   Hyperlipidemia: She is taking her medication daily. She is compliant with a low fat/cholesterol diet. She is exercising regularly. She denies myalgias.   Reactive airway disease:  She occasionally has chest tightness and uses the albuterol rarely.  The medication works well.  She will need the inhlaer for colds, but rarely gets a cold.    Medications and allergies reviewed with patient and updated if appropriate.  Patient Active Problem List   Diagnosis Date Noted  . Reactive airway disease 02/13/2018  . Sleep difficulties 08/09/2017  . B12 deficiency 06/09/2016  . Vitamin D deficiency 06/09/2016  . 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 Diabetes ([place]) 08/20/2008  . Hyperlipidemia 06/13/2007  . Essential hypertension 07/04/2006  . MITRAL VALVE PROLAPSE, HX OF 07/04/2006    Current Outpatient Medications on File Prior to Visit  Medication Sig Dispense Refill  . amLODipine (NORVASC) 10 MG tablet Take 1/2 tablet twice a day 180 tablet 3  . aspirin 81 MG chewable tablet Chew 81 mg by mouth daily.    . Blood Pressure Monitor KIT 1 Package by Does not apply route daily. Check blood pressure as directed by pharmacist or physician. Diagnosis code I10 1 each 0  .  carvedilol (COREG) 25 MG tablet Take 1 tablet (25 mg total) by mouth 2 (two) times daily with a meal. 180 tablet 3  . hydrALAZINE (APRESOLINE) 50 MG tablet TAKE 1 TABLET BY MOUTH 3 TIMES A DAY. 90 tablet 3  . hydrochlorothiazide (HYDRODIURIL) 12.5 MG tablet Take 12.5 mg by mouth daily.    . rosuvastatin (CRESTOR) 20 MG tablet Take 1 tablet (20 mg total) by mouth daily. 90 tablet 3   No current facility-administered medications on file prior to visit.     Past Medical History:  Diagnosis Date  . Bronchitis   . Diabetes mellitus without complication ([place])   . Hyperlipemia   . Hypertension   . Kidney stones 2005 & 08/2011    passed spontaneously X 2    Past Surgical History:  Procedure Laterality Date  . ABDOMINAL HYSTERECTOMY  10/05/11   Dr Nori Riis  . OOPHORECTOMY  10/05/11   degenerative fibroid; Dr Nori Riis  . TONSILLECTOMY    . vaginal bleed  10/19/11   hematoma; Dr Matthew Saras    Social History   Socioeconomic History  . Marital status: Divorced    Spouse name: Not on file  . Number of children: Not on file  . Years of education: Not on file  . Highest education level: Not on file  Occupational History  . Not on file  Social Needs  . Financial resource strain: Not on file  . Food insecurity:    Worry: Not on file  Inability: Not on file  . Transportation needs:    Medical: Not on file    Non-medical: Not on file  Tobacco Use  . Smoking status: Never Smoker  . Smokeless tobacco: Never Used  Substance and Sexual Activity  . Alcohol use: No  . Drug use: No  . Sexual activity: Not Currently  Lifestyle  . Physical activity:    Days per week: Not on file    Minutes per session: Not on file  . Stress: Not on file  Relationships  . Social connections:    Talks on phone: Not on file    Gets together: Not on file    Attends religious service: Not on file    Active member of club or organization: Not on file    Attends meetings of clubs or organizations: Not on file     Relationship status: Not on file  Other Topics Concern  . Not on file  Social History Narrative  . Not on file    Family History  Problem Relation Age of Onset  . Alzheimer's disease Mother   . Cancer Mother   . Kidney disease Father   . Diabetes Father   . Prostate cancer Father   . Cancer Brother        spine  . Liver cancer Maternal Grandfather     Review of Systems  Constitutional: Negative for chills and fever.  Respiratory: Negative for cough, shortness of breath and wheezing.   Cardiovascular: Negative for chest pain, palpitations and leg swelling.  Musculoskeletal: Negative for myalgias.  Neurological: Positive for light-headedness (occasional with lower BPs) and headaches. Facial asymmetry: rare.       Objective:   Vitals:   02/13/18 0743  BP: 124/82  Pulse: 82  Resp: 16  Temp: 98.5 F (36.9 C)  SpO2: 96%   BP Readings from Last 3 Encounters:  02/13/18 124/82  02/09/18 120/86  12/23/17 (!) 152/86   Wt Readings from Last 3 Encounters:  02/13/18 211 lb 12.8 oz (96.1 kg)  02/09/18 208 lb (94.3 kg)  11/15/17 210 lb (95.3 kg)   Body mass index is 31.51 kg/m.   Physical Exam    Constitutional: Appears well-developed and well-nourished. No distress.  HENT:  Head: Normocephalic and atraumatic.  Neck: Neck supple. No tracheal deviation present. No thyromegaly present.  No cervical lymphadenopathy Cardiovascular: Normal rate, regular rhythm and normal heart sounds.   No murmur heard. No carotid bruit .  No edema Pulmonary/Chest: Effort normal and breath sounds normal. No respiratory distress. No has no wheezes. No rales.  Skin: Skin is warm and dry. Not diaphoretic.  Psychiatric: Normal mood and affect. Behavior is normal.      Assessment & Plan:    See Problem List for Assessment and Plan of chronic medical problems.

## 2018-02-11 NOTE — Patient Instructions (Addendum)
  Take vitamin D 1000 units daily and vitamin B12 1000 mcg daily.    All other Health Maintenance issues reviewed.   All recommended immunizations and age-appropriate screenings are up-to-date or discussed.  prevnar pneumonia immunization administered today.   Medications reviewed and updated.  Changes include :   none  Your prescription(s) have been submitted to your pharmacy. Please take as directed and contact our office if you believe you are having problem(s) with the medication(s).  Please followup in 6 months

## 2018-02-13 ENCOUNTER — Encounter: Payer: Self-pay | Admitting: Internal Medicine

## 2018-02-13 ENCOUNTER — Ambulatory Visit (INDEPENDENT_AMBULATORY_CARE_PROVIDER_SITE_OTHER): Payer: Medicare Other | Admitting: Internal Medicine

## 2018-02-13 VITALS — BP 124/82 | HR 82 | Temp 98.5°F | Resp 16 | Ht 68.75 in | Wt 211.8 lb

## 2018-02-13 DIAGNOSIS — E119 Type 2 diabetes mellitus without complications: Secondary | ICD-10-CM

## 2018-02-13 DIAGNOSIS — J4520 Mild intermittent asthma, uncomplicated: Secondary | ICD-10-CM | POA: Diagnosis not present

## 2018-02-13 DIAGNOSIS — J45909 Unspecified asthma, uncomplicated: Secondary | ICD-10-CM | POA: Insufficient documentation

## 2018-02-13 DIAGNOSIS — Z23 Encounter for immunization: Secondary | ICD-10-CM

## 2018-02-13 DIAGNOSIS — E782 Mixed hyperlipidemia: Secondary | ICD-10-CM

## 2018-02-13 DIAGNOSIS — I10 Essential (primary) hypertension: Secondary | ICD-10-CM

## 2018-02-13 MED ORDER — ALBUTEROL SULFATE HFA 108 (90 BASE) MCG/ACT IN AERS: 2.0000 | INHALATION_SPRAY | Freq: Four times a day (QID) | RESPIRATORY_TRACT | 8 refills | Status: DC | PRN

## 2018-02-13 NOTE — Assessment & Plan Note (Signed)
Rarely uses albuterol Will continue albuterol as needed

## 2018-02-13 NOTE — Addendum Note (Signed)
Addended by: Mercer PodWRENN, Danee Soller E on: 02/13/2018 08:23 AM   Modules accepted: Orders

## 2018-02-13 NOTE — Assessment & Plan Note (Signed)
Diet controlled Check a1c Low sugar / carb diet Stressed regular exercise  

## 2018-02-13 NOTE — Assessment & Plan Note (Signed)
BP well controlled Current regimen effective and well tolerated Continue current medications at current doses cmp  

## 2018-02-13 NOTE — Assessment & Plan Note (Signed)
Check lipid panel  Continue daily statin Regular exercise and healthy diet encouraged  

## 2018-02-21 MED ORDER — ROSUVASTATIN CALCIUM 20 MG PO TABS: 20.0000 mg | ORAL_TABLET | Freq: Every day | ORAL | 3 refills | Status: DC

## 2018-08-01 ENCOUNTER — Telehealth: Payer: Self-pay | Admitting: Cardiovascular Disease

## 2018-08-01 NOTE — Telephone Encounter (Signed)
Smartphone/consent/ my chart/ pre reg completed °

## 2018-08-01 NOTE — Progress Notes (Signed)
Virtual Visit via Video Note   This visit type was conducted due to national recommendations for restrictions regarding the COVID-19 Pandemic (e.g. social distancing) in an effort to limit this patient's exposure and mitigate transmission in our community.  Due to her co-morbid illnesses, this patient is at least at moderate risk for complications without adequate follow up.  This format is felt to be most appropriate for this patient at this time.  The patient did not have access to video technology/had technical difficulties with video requiring transitioning to audio format only (telephone).  All issues noted in this document were discussed and addressed.  No physical exam could be performed with this format.  Please refer to the patient's chart for her  consent to telehealth for [facility].   Date:  08/02/2018   ID:  Cynthia Forbes, DOB October 27, 1950, MRN [id number]  Patient Location: Home Provider Location: Office  PCP:  Pincus Sanes, MD  Cardiologist:  Chilton Si, MD  Electrophysiologist:  None   Evaluation Performed:  Follow-Up Visit  Chief Complaint:  hypertension  History of Present Illness:    Cynthia Forbes is a 68 y.o. female hypertension, hyperlipidemia, diabetes, and mitral valve prolapse here for follow up.  She was first seen 06/25/16 for the evaluation of an abnormal EKG with anterolateral T wave inversions.  It was unchanged from 2015.  She has struggled with difficult to control BP.  Hydralazine was increased and she was started on HCTZ/triamterene.  She followed up with our pharmacist on 08/26/2016 and her blood pressure remained elevated.    Since her last appointment Cynthia Forbes has been doing well.  She has been feeling great.  She is working from home due to COVID-19.  She has been at home for the last 7 weeks.  She has been exercising at home and eating at home.  She has been trying to eat a more healthy diet. She no longer feels tired and drained.  She  attributes this to taking vitamins and exercise.  She hasn't experienced any chest pain, shortness of breath, lower extremity edema, orthopnea or PND.   The patient does not have symptoms concerning for COVID-19 infection (fever, chills, cough, or new shortness of breath).    Past Medical History:  Diagnosis Date  . Bronchitis   . Diabetes mellitus without complication ([facility])   . Hyperlipemia   . Hypertension   . Kidney stones 2005 & 08/2011    passed spontaneously X 2   Past Surgical History:  Procedure Laterality Date  . ABDOMINAL HYSTERECTOMY  10/05/11   Dr Jennette Kettle  . OOPHORECTOMY  10/05/11   degenerative fibroid; Dr Jennette Kettle  . TONSILLECTOMY    . vaginal bleed  10/19/11   hematoma; Dr Marcelle Overlie     Current Meds  Medication Sig  . albuterol (PROAIR HFA) 108 (90 Base) MCG/ACT inhaler Inhale 2 puffs into the lungs every 6 (six) hours as needed for wheezing or shortness of breath. Must keep appt for future refills  . amLODipine (NORVASC) 10 MG tablet Take 1/2 tablet twice a day  . aspirin 81 MG chewable tablet Chew 81 mg by mouth daily.  . carvedilol (COREG) 25 MG tablet Take 1 tablet (25 mg total) by mouth 2 (two) times daily with a meal.  . hydrALAZINE (APRESOLINE) 50 MG tablet TAKE 1 TABLET BY MOUTH 3 TIMES A DAY.     Allergies:   Codeine; Ivp dye [iodinated diagnostic agents]; Benazepril; Benicar [olmesartan]; Clonidine derivatives; and Strawberry  extract   Social History   Tobacco Use  . Smoking status: Never Smoker  . Smokeless tobacco: Never Used  Substance Use Topics  . Alcohol use: No  . Drug use: No     Family Hx: The patient's family history includes Alzheimer's disease in her mother; Cancer in her brother and mother; Diabetes in her father; Kidney disease in her father; Liver cancer in her maternal grandfather; Prostate cancer in her father.  ROS:   Please see the history of present illness.    Hair loss   Prior CV studies:   The following studies were reviewed  today:  n/a  Labs/Other Tests and Data Reviewed:    EKG:  No ECG reviewed.  Recent Labs: 08/12/2017: Hemoglobin 12.6; Platelets 328.0; TSH 1.39 02/09/2018: ALT 12; BUN 17; Creatinine, Ser 0.97; Potassium 4.1; Sodium 142   Recent Lipid Panel Lab Results  Component Value Date/Time   CHOL 129 02/09/2018 09:13 AM   TRIG 60 02/09/2018 09:13 AM   HDL 49 02/09/2018 09:13 AM   CHOLHDL 2.6 02/09/2018 09:13 AM   CHOLHDL 4 08/12/2017 09:04 AM   LDLCALC 68 02/09/2018 09:13 AM   LDLDIRECT 176.7 01/11/2013 08:21 AM    Wt Readings from Last 3 Encounters:  08/02/18 198 lb (89.8 kg)  02/13/18 211 lb 12.8 oz (96.1 kg)  02/09/18 208 lb (94.3 kg)     Objective:    BP 129/83 Comment: beforre morning medications and eating breakfast  Pulse 75   Ht 5\' 8"  (1.727 m)   Wt 198 lb (89.8 kg)   BMI 30.11 kg/m  GENERAL: Well-appearing.  No acute distress. HEENT: Pupils equal round.  Oral mucosa unremarkable NECK:  No jugular venous distention, no visible thyromegaly EXT:  No edema, no cyanosis no clubbing SKIN:  No rashes no nodules NEURO:  Speech fluent.  Cranial nerves grossly intact.  Moves all 4 extremities freely PSYCH:  Cognitively intact, oriented to person place and time   ASSESSMENT & PLAN:    # Hypertension: Blood pressure  is well-controlled. Continue amlodipine, carvedilol, hydralazine, and HCTZ.     # Hyperlipidemia: Continue rosuvastatin.  LDL 68.   # Obesity: Cynthia Forbes was congratulated on continuing to exercise.     COVID-19 Education: The signs and symptoms of COVID-19 were discussed with the patient and how to seek care for testing (follow up with PCP or arrange E-visit).  The importance of social distancing was discussed today.  Time:   Today, I have spent 26 minutes with the patient with telehealth technology discussing the above problems.     Medication Adjustments/Labs and Tests Ordered: Current medicines are reviewed at length with the patient today.  Concerns  regarding medicines are outlined above.   Tests Ordered: No orders of the defined types were placed in this encounter.   Medication Changes: No orders of the defined types were placed in this encounter.   Disposition:  Follow up in 1 year(s)  Signed, Chilton Siiffany Jordan, MD  08/02/2018 8:28 AM    [facility]

## 2018-08-02 ENCOUNTER — Telehealth (INDEPENDENT_AMBULATORY_CARE_PROVIDER_SITE_OTHER): Payer: Medicare Other | Admitting: Cardiovascular Disease

## 2018-08-02 ENCOUNTER — Encounter: Payer: Self-pay | Admitting: Cardiovascular Disease

## 2018-08-02 DIAGNOSIS — I10 Essential (primary) hypertension: Secondary | ICD-10-CM

## 2018-08-02 DIAGNOSIS — E782 Mixed hyperlipidemia: Secondary | ICD-10-CM | POA: Diagnosis not present

## 2018-08-02 DIAGNOSIS — E669 Obesity, unspecified: Secondary | ICD-10-CM

## 2018-08-02 NOTE — Patient Instructions (Addendum)
Medication Instructions:  Your physician recommends that you continue on your current medications as directed. Please refer to the Current Medication list given to you today.  If you need a refill on your cardiac medications before your next appointment, please call your pharmacy.   Lab work: NONE  Testing/Procedures: NONE  Follow-Up: At [facility], you and your health needs are our priority.  As part of our continuing mission to provide you with exceptional heart care, we have created designated Provider Care Teams.  These Care Teams include your primary Cardiologist (physician) and Advanced Practice Providers (APPs -  Physician Assistants and Nurse Practitioners) who all work together to provide you with the care you need, when you need it. You will need a follow up appointment in 12 months.  Please call our office 2 months in advance to schedule this appointment.  You may see Alyas Creary [place], MD or one of the following Advanced Practice Providers on your designated Care Team:   Luke Kilroy, PA-C Krista Kroeger, PA-C . Callie Goodrich, PA-C    

## 2018-08-10 ENCOUNTER — Telehealth: Payer: Medicare Other | Admitting: Cardiovascular Disease

## 2018-09-09 ENCOUNTER — Other Ambulatory Visit: Payer: Self-pay | Admitting: Internal Medicine

## 2018-10-03 ENCOUNTER — Other Ambulatory Visit: Payer: Self-pay | Admitting: *Deleted

## 2018-10-03 DIAGNOSIS — Z20822 Contact with and (suspected) exposure to covid-19: Secondary | ICD-10-CM

## 2018-10-08 LAB — NOVEL CORONAVIRUS, NAA: SARS-CoV-2, NAA: NOT DETECTED

## 2018-11-12 ENCOUNTER — Other Ambulatory Visit: Payer: Self-pay | Admitting: Internal Medicine

## 2019-01-05 DIAGNOSIS — Z23 Encounter for immunization: Secondary | ICD-10-CM | POA: Diagnosis not present

## 2019-02-02 ENCOUNTER — Other Ambulatory Visit: Payer: Self-pay | Admitting: Internal Medicine

## 2019-02-02 ENCOUNTER — Other Ambulatory Visit: Payer: Self-pay | Admitting: Cardiovascular Disease

## 2019-02-02 NOTE — Telephone Encounter (Signed)
Rx(s) sent to pharmacy electronically.  

## 2019-02-11 ENCOUNTER — Other Ambulatory Visit: Payer: Self-pay | Admitting: Cardiology

## 2019-02-11 DIAGNOSIS — Z20822 Contact with and (suspected) exposure to covid-19: Secondary | ICD-10-CM

## 2019-02-12 LAB — NOVEL CORONAVIRUS, NAA: SARS-CoV-2, NAA: NOT DETECTED

## 2019-07-23 ENCOUNTER — Telehealth: Payer: Self-pay | Admitting: Internal Medicine

## 2019-07-23 NOTE — Telephone Encounter (Signed)
She is overdue for a follow up visit - please call her to schedule.

## 2019-08-08 NOTE — Patient Instructions (Addendum)
  Blood work was ordered.     Medications reviewed and updated.  Changes include :   none  Your prescription(s) have been submitted to your pharmacy. Please take as directed and contact our office if you believe you are having problem(s) with the medication(s).   Please followup in 6 months   

## 2019-08-08 NOTE — Progress Notes (Signed)
Subjective:    Patient ID: Cynthia Forbes, female    DOB: October 05, 1950, 69 y.o.   MRN: [id number]  HPI The patient is here for follow up of their chronic medical problems, including hypertension, diabetes, hyperlipidemia  She is taking all of her medications as prescribed.   She is not exercising regularly.   She is eating pretty well.     Medications and allergies reviewed with patient and updated if appropriate.  Patient Active Problem List   Diagnosis Date Noted  . Reactive airway disease 02/13/2018  . Sleep difficulties 08/09/2017  . B12 deficiency 06/09/2016  . Vitamin D deficiency 06/09/2016  . 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 Diabetes ([place]) 08/20/2008  . Hyperlipidemia 06/13/2007  . Essential hypertension 07/04/2006  . MITRAL VALVE PROLAPSE, HX OF 07/04/2006    Current Outpatient Medications on File Prior to Visit  Medication Sig Dispense Refill  . albuterol (PROAIR HFA) 108 (90 Base) MCG/ACT inhaler Inhale 2 puffs into the lungs every 6 (six) hours as needed for wheezing or shortness of breath. Must keep appt for future refills 18 g 8  . amLODipine (NORVASC) 10 MG tablet Take 0.5 tablets (5 mg total) by mouth 2 (two) times daily. 90 tablet 1  . aspirin 81 MG chewable tablet Chew 81 mg by mouth daily.    . Blood Pressure Monitor KIT 1 Package by Does not apply route daily. Check blood pressure as directed by pharmacist or physician. Diagnosis code I10 1 each 0  . carvedilol (COREG) 25 MG tablet Take 1 tablet (25 mg total) by mouth 2 (two) times daily with a meal. 180 tablet 3  . hydrALAZINE (APRESOLINE) 50 MG tablet TAKE 1 TABLET BY MOUTH THREE TIMES DAILY (NEW  DOSE). Need office visit for more refills. 90 tablet 0  . hydrochlorothiazide (HYDRODIURIL) 12.5 MG tablet Take 12.5 mg by mouth daily.    . rosuvastatin (CRESTOR) 20 MG tablet Take 1 tablet (20 mg total) by mouth daily. 90 tablet 3   No current facility-administered medications on file prior to visit.     Past Medical History:  Diagnosis Date  . Bronchitis   . Diabetes mellitus without complication ([place])   . Hyperlipemia   . Hypertension   . Kidney stones 2005 & 08/2011    passed spontaneously X 2    Past Surgical History:  Procedure Laterality Date  . ABDOMINAL HYSTERECTOMY  10/05/11   Dr Nori Riis  . OOPHORECTOMY  10/05/11   degenerative fibroid; Dr Nori Riis  . TONSILLECTOMY    . vaginal bleed  10/19/11   hematoma; Dr Matthew Saras    Social History   Socioeconomic History  . Marital status: Divorced    Spouse name: Not on file  . Number of children: Not on file  . Years of education: Not on file  . Highest education level: Not on file  Occupational History  . Not on file  Tobacco Use  . Smoking status: Never Smoker  . Smokeless tobacco: Never Used  Substance and Sexual Activity  . Alcohol use: No  . Drug use: No  . Sexual activity: Not Currently  Other Topics Concern  . Not on file  Social History Narrative  . Not on file   Social Determinants of Health   Financial Resource Strain:   . Difficulty of Paying Living Expenses:   Food Insecurity:   . Worried About Charity fundraiser in the Last Year:   . [place] in the  Last Year:   Transportation Needs:   . Film/video editor (Medical):   Marland Kitchen Lack of Transportation (Non-Medical):   Physical Activity:   . Days of Exercise per Week:   . Minutes of Exercise per Session:   Stress:   . Feeling of Stress :   Social Connections:   . Frequency of Communication with Friends and Family:   . Frequency of Social Gatherings with Friends and Family:   . Attends Religious Services:   . Active Member of Clubs or Organizations:   . Attends Archivist Meetings:   Marland Kitchen Marital Status:     Family History  Problem Relation Age of Onset  . Alzheimer's disease Mother   . Cancer Mother   . Kidney disease Father   . Diabetes Father   . Prostate cancer Father   . Cancer Brother        spine  . Liver cancer Maternal  Grandfather     Review of Systems  Constitutional: Negative for chills and fever.  Eyes: Negative for visual disturbance.  Respiratory: Positive for cough (with allergies). Negative for shortness of breath and wheezing.   Cardiovascular: Positive for chest pain (occ from MPV - same for years- transient) and palpitations (occ from MPV - same for years- transien). Negative for leg swelling.  Neurological: Positive for light-headedness (occ). Negative for dizziness, numbness and headaches.       Objective:   Vitals:   08/09/19 0845  BP: (!) 146/96  Pulse: 66  Resp: 16  Temp: 98.1 F (36.7 C)  SpO2: 97%   BP Readings from Last 3 Encounters:  08/09/19 (!) 146/96  08/09/19 (!) 150/100  08/02/18 129/83   Wt Readings from Last 3 Encounters:  08/09/19 216 lb (98 kg)  08/09/19 216 lb 12.8 oz (98.3 kg)  08/02/18 198 lb (89.8 kg)   Body mass index is 32.84 kg/m.   Physical Exam    Constitutional: Appears well-developed and well-nourished. No distress.  HENT:  Head: Normocephalic and atraumatic.  Neck: Neck supple. No tracheal deviation present. No thyromegaly present.  No cervical lymphadenopathy Cardiovascular: Normal rate, regular rhythm and normal heart sounds.   No murmur heard. No carotid bruit .  No edema Pulmonary/Chest: Effort normal and breath sounds normal. No respiratory distress. No has no wheezes. No rales.  Skin: Skin is warm and dry. Not diaphoretic.  Psychiatric: Normal mood and affect. Behavior is normal.      Assessment & Plan:    See Problem List for Assessment and Plan of chronic medical problems.    This visit occurred during the SARS-CoV-2 public health emergency.  Safety protocols were in place, including screening questions prior to the visit, additional usage of staff PPE, and extensive cleaning of exam room while observing appropriate contact time as indicated for disinfecting solutions.

## 2019-08-09 ENCOUNTER — Ambulatory Visit (INDEPENDENT_AMBULATORY_CARE_PROVIDER_SITE_OTHER): Payer: Medicare Other

## 2019-08-09 ENCOUNTER — Encounter: Payer: Self-pay | Admitting: Internal Medicine

## 2019-08-09 ENCOUNTER — Ambulatory Visit (INDEPENDENT_AMBULATORY_CARE_PROVIDER_SITE_OTHER): Payer: Medicare Other | Admitting: Internal Medicine

## 2019-08-09 ENCOUNTER — Other Ambulatory Visit: Payer: Self-pay

## 2019-08-09 VITALS — BP 150/100 | HR 88 | Temp 98.1°F | Ht 68.0 in | Wt 216.8 lb

## 2019-08-09 VITALS — BP 146/96 | HR 66 | Temp 98.1°F | Resp 16 | Ht 68.0 in | Wt 216.0 lb

## 2019-08-09 DIAGNOSIS — E782 Mixed hyperlipidemia: Secondary | ICD-10-CM

## 2019-08-09 DIAGNOSIS — E119 Type 2 diabetes mellitus without complications: Secondary | ICD-10-CM | POA: Diagnosis not present

## 2019-08-09 DIAGNOSIS — Z0000 Encounter for general adult medical examination without abnormal findings: Secondary | ICD-10-CM

## 2019-08-09 DIAGNOSIS — I10 Essential (primary) hypertension: Secondary | ICD-10-CM | POA: Diagnosis not present

## 2019-08-09 DIAGNOSIS — J4520 Mild intermittent asthma, uncomplicated: Secondary | ICD-10-CM | POA: Diagnosis not present

## 2019-08-09 LAB — COMPREHENSIVE METABOLIC PANEL
ALT: 15 U/L (ref 0–35)
AST: 18 U/L (ref 0–37)
Albumin: 4.3 g/dL (ref 3.5–5.2)
Alkaline Phosphatase: 94 U/L (ref 39–117)
BUN: 13 mg/dL (ref 6–23)
CO2: 30 mEq/L (ref 19–32)
Calcium: 9.4 mg/dL (ref 8.4–10.5)
Chloride: 104 mEq/L (ref 96–112)
Creatinine, Ser: 0.91 mg/dL (ref 0.40–1.20)
GFR: 74.63 mL/min (ref >60.00)
Glucose, Bld: 128 mg/dL — ABNORMAL HIGH (ref 70–99)
Potassium: 3.9 mEq/L (ref 3.5–5.1)
Sodium: 141 mEq/L (ref 135–145)
Total Bilirubin: 0.5 mg/dL (ref 0.2–1.2)
Total Protein: 8.2 g/dL (ref 6.0–8.3)

## 2019-08-09 LAB — MICROALBUMIN / CREATININE URINE RATIO
Creatinine,U: 113.8 mg/dL
Microalb Creat Ratio: 2.8 mg/g (ref 0.0–30.0)
Microalb, Ur: 3.2 mg/dL — ABNORMAL HIGH (ref 0.0–1.9)

## 2019-08-09 LAB — CBC WITH DIFFERENTIAL/PLATELET
Basophils Absolute: 0 10*3/uL (ref 0.0–0.1)
Basophils Relative: 0.3 % (ref 0.0–3.0)
Eosinophils Absolute: 0.1 10*3/uL (ref 0.0–0.7)
Eosinophils Relative: 1.1 % (ref 0.0–5.0)
HCT: 40.2 % (ref 36.0–46.0)
Hemoglobin: 12.9 g/dL (ref 12.0–15.0)
Lymphocytes Relative: 29.4 % (ref 12.0–46.0)
Lymphs Abs: 2.2 10*3/uL (ref 0.7–4.0)
MCHC: 32.1 g/dL (ref 30.0–36.0)
MCV: 81.5 fl (ref 78.0–100.0)
Monocytes Absolute: 0.4 10*3/uL (ref 0.1–1.0)
Monocytes Relative: 5.2 % (ref 3.0–12.0)
Neutro Abs: 4.8 10*3/uL (ref 1.4–7.7)
Neutrophils Relative %: 64 % (ref 43.0–77.0)
Platelets: 317 10*3/uL (ref 150.0–400.0)
RBC: 4.93 Mil/uL (ref 3.87–5.11)
RDW: 15.7 % — ABNORMAL HIGH (ref 11.5–15.5)
WBC: 7.5 10*3/uL (ref 4.0–10.5)

## 2019-08-09 LAB — LIPID PANEL
Cholesterol: 180 mg/dL (ref 0–200)
HDL: 46.7 mg/dL (ref >39.00)
LDL Cholesterol: 113 mg/dL — ABNORMAL HIGH (ref 0–99)
NonHDL: 133.04
Total CHOL/HDL Ratio: 4
Triglycerides: 98 mg/dL (ref 0.0–149.0)
VLDL: 19.6 mg/dL (ref 0.0–40.0)

## 2019-08-09 LAB — HEMOGLOBIN A1C: Hgb A1c MFr Bld: 7.8 % — ABNORMAL HIGH (ref 4.6–6.5)

## 2019-08-09 MED ORDER — AIRBORNE ELDERBERRY 100-50 MG PO CHEW: CHEWABLE_TABLET | ORAL | Status: DC

## 2019-08-09 MED ORDER — VITAMIN C 250 MG PO TABS: 250.0000 mg | ORAL_TABLET | Freq: Every day | ORAL | Status: AC

## 2019-08-09 MED ORDER — VITAMIN B-12 1000 MCG PO TABS
1000.0000 ug | ORAL_TABLET | Freq: Every day | ORAL | Status: AC
Start: 2019-08-09 — End: ?

## 2019-08-09 MED ORDER — HYDRALAZINE HCL 50 MG PO TABS: ORAL_TABLET | ORAL | 1 refills | Status: DC

## 2019-08-09 NOTE — Assessment & Plan Note (Signed)
Chronic Triggered by allergies Rarely uses albuterol Continue albuterol prn

## 2019-08-09 NOTE — Assessment & Plan Note (Addendum)
Chronic Check lipid panel  Continue daily statin Regular exercise and healthy diet encouraged Encouraged weight loss 

## 2019-08-09 NOTE — Progress Notes (Signed)
Subjective:   TANIKA BRACCO is a 69 y.o. female who presents for an Initial Medicare Annual Wellness Visit.  Review of Systems    No ROS. Medicare Wellness Visit. Additional risk factors are reflected in social history. Cardiac Risk Factors include: advanced age (>14mn, >>22women);diabetes mellitus;dyslipidemia;hypertension;obesity (BMI >30kg/m2)  Sleep Patterns: Has sleep issues, awakes multiple times during the night; has tried otc medication. Home Safety/Smoke Alarms: Feels safe in home; uses home alarm. Smoke alarms in place. Living environment:1-story apartment. Lives alone, no needs for DME, good support system. Seat Belt Safety/Bike Helmet: Wears seat belt.    Objective:    Today's Vitals   08/09/19 0842 08/09/19 0843  BP: (!) 150/100   Pulse: 88   Temp: 98.1 F (36.7 C)   SpO2: 97%   Weight: 216 lb 12.8 oz (98.3 kg)   Height: 5' 8" (1.727 m)   PainSc: 0-No pain 0-No pain   Body mass index is 32.96 kg/m.  Advanced Directives 08/09/2019 01/22/2014 10/19/2011  Does Patient Have a Medical Advance Directive? No No Patient does not have advance directive  Would patient like information on creating a medical advance directive? Yes (ED - Information included in AVS) No - patient declined information -  Pre-existing out of facility DNR order (yellow form or pink MOST form) - - No    Current Medications (verified) Outpatient Encounter Medications as of 08/09/2019  Medication Sig  . albuterol (PROAIR HFA) 108 (90 Base) MCG/ACT inhaler Inhale 2 puffs into the lungs every 6 (six) hours as needed for wheezing or shortness of breath. Must keep appt for future refills  . amLODipine (NORVASC) 10 MG tablet Take 0.5 tablets (5 mg total) by mouth 2 (two) times daily.  .Marland Kitchenaspirin 81 MG chewable tablet Chew 81 mg by mouth daily.  . Blood Pressure Monitor KIT 1 Package by Does not apply route daily. Check blood pressure as directed by pharmacist or physician. Diagnosis code I10  .  carvedilol (COREG) 25 MG tablet Take 1 tablet (25 mg total) by mouth 2 (two) times daily with a meal.  . rosuvastatin (CRESTOR) 20 MG tablet Take 1 tablet (20 mg total) by mouth daily.  . hydrochlorothiazide (HYDRODIURIL) 12.5 MG tablet Take 12.5 mg by mouth daily.  . [DISCONTINUED] hydrALAZINE (APRESOLINE) 50 MG tablet TAKE 1 TABLET BY MOUTH THREE TIMES DAILY (NEW  DOSE). Need office visit for more refills.   No facility-administered encounter medications on file as of 08/09/2019.    Allergies (verified) Codeine, Ivp dye [iodinated diagnostic agents], Benazepril, Benicar [olmesartan], Clonidine derivatives, and Strawberry extract   History: Past Medical History:  Diagnosis Date  . Bronchitis   . Diabetes mellitus without complication ([place]   . Hyperlipemia   . Hypertension   . Kidney stones 2005 & 08/2011    passed spontaneously X 2   Past Surgical History:  Procedure Laterality Date  . ABDOMINAL HYSTERECTOMY  10/05/11   Dr NNori Riis . OOPHORECTOMY  10/05/11   degenerative fibroid; Dr NNori Riis . TONSILLECTOMY    . vaginal bleed  10/19/11   hematoma; Dr HMatthew Saras  Family History  Problem Relation Age of Onset  . Alzheimer's disease Mother   . Cancer Mother   . Kidney disease Father   . Diabetes Father   . Prostate cancer Father   . Cancer Brother        spine  . Liver cancer Maternal Grandfather    Social History   Socioeconomic History  .  Marital status: Divorced    Spouse name: Not on file  . Number of children: Not on file  . Years of education: Not on file  . Highest education level: Not on file  Occupational History  . Not on file  Tobacco Use  . Smoking status: Never Smoker  . Smokeless tobacco: Never Used  Substance and Sexual Activity  . Alcohol use: No  . Drug use: No  . Sexual activity: Not Currently  Other Topics Concern  . Not on file  Social History Narrative  . Not on file   Social Determinants of Health   Financial Resource Strain:   . Difficulty of  Paying Living Expenses:   Food Insecurity:   . Worried About Charity fundraiser in the Last Year:   . Arboriculturist in the Last Year:   Transportation Needs:   . Film/video editor (Medical):   Marland Kitchen Lack of Transportation (Non-Medical):   Physical Activity:   . Days of Exercise per Week:   . Minutes of Exercise per Session:   Stress:   . Feeling of Stress :   Social Connections:   . Frequency of Communication with Friends and Family:   . Frequency of Social Gatherings with Friends and Family:   . Attends Religious Services:   . Active Member of Clubs or Organizations:   . Attends Archivist Meetings:   Marland Kitchen Marital Status:     Tobacco Counseling Counseling given: No   Clinical Intake:  Pre-visit preparation completed: Yes  Pain : No/denies pain Pain Score: 0-No pain     BMI - recorded: 33 Nutritional Status: BMI > 30  Obese Nutritional Risks: Other (Comment) Diabetes: Yes CBG done?: No Did pt. bring in CBG monitor from home?: No  How often do you need to have someone help you when you read instructions, pamphlets, or other written materials from your doctor or pharmacy?: 1 - Never What is the last grade level you completed in school?: Bachelor's Degree  Interpreter Needed?: No  Information entered by :: Shenika N. Hatfield, LPN   Activities of Daily Living In your present state of health, do you have any difficulty performing the following activities: 08/09/2019  Hearing? N  Vision? N  Difficulty concentrating or making decisions? N  Walking or climbing stairs? N  Dressing or bathing? N  Doing errands, shopping? N  Preparing Food and eating ? N  Using the Toilet? N  In the past six months, have you accidently leaked urine? Y  Do you have problems with loss of bowel control? N  Managing your Medications? N  Managing your Finances? N  Housekeeping or managing your Housekeeping? N  Some recent data might be hidden     Immunizations and Health  Maintenance Immunization History  Administered Date(s) Administered  . Fluad Quad(high Dose 65+) 01/05/2019  . Influenza-Unspecified 11/28/2015, 01/20/2017, 01/03/2018  . PFIZER SARS-COV-2 Vaccination 04/18/2019, 05/12/2019  . Pneumococcal Conjugate-13 02/13/2018  . Tdap 03/30/2007   Health Maintenance Due  Topic Date Due  . OPHTHALMOLOGY EXAM  Never done  . MAMMOGRAM  10/21/2016  . TETANUS/TDAP  03/29/2017  . DEXA SCAN  Never done  . FOOT EXAM  08/10/2018  . HEMOGLOBIN A1C  08/10/2018    Patient Care Team: Binnie Rail, MD as PCP - General (Internal Medicine) Skeet Latch, MD as PCP - Cardiology (Cardiology)  Indicate any recent Medical Services you may have received from other than [facility] providers in the past  year (date may be approximate).     Assessment:   This is a routine wellness examination for [place].  Hearing/Vision screen No exam data present  Dietary issues and exercise activities discussed: Current Exercise Habits: The patient does not participate in regular exercise at present, Exercise limited by: None identified  Goals    . Client understands the importance of follow-up with providers by attending scheduled visits    . Patient Stated     Would like to get off some of these blood pressure medicines.      Depression Screen PHQ 2/9 Scores 08/09/2019 08/09/2019 08/09/2017  PHQ - 2 Score 0 0 0  PHQ- 9 Score - - 4    Fall Risk Fall Risk  08/09/2019 08/09/2019  Falls in the past year? 0 0  Number falls in past yr: 0 0  Injury with Fall? 0 0  Risk for fall due to : No Fall Risks -  Follow up Falls evaluation completed;Education provided;Falls prevention discussed Falls evaluation completed    Is the patient's home free of loose throw rugs in walkways, pet beds, electrical cords, etc?   yes      Grab bars in the bathroom? yes      Handrails on the stairs?   yes      Adequate lighting?   yes    Cognitive Function:     6CIT Screen 08/09/2019    What Year? 0 points  What month? 0 points  What time? 0 points  Count back from 20 0 points  Months in reverse 0 points  Repeat phrase 0 points  Total Score 0    Screening Tests Health Maintenance  Topic Date Due  . OPHTHALMOLOGY EXAM  Never done  . MAMMOGRAM  10/21/2016  . TETANUS/TDAP  03/29/2017  . DEXA SCAN  Never done  . FOOT EXAM  08/10/2018  . HEMOGLOBIN A1C  08/10/2018  . PNA vac Low Risk Adult (2 of 2 - PPSV23) 08/08/2020 (Originally 02/14/2019)  . INFLUENZA VACCINE  10/28/2019  . Fecal DNA (Cologuard)  08/17/2020  . COVID-19 Vaccine  Completed  . Hepatitis C Screening  Completed    Qualifies for Shingles Vaccine? Yes, will check with local pharmacy.  Cancer Screenings: Lung: Low Dose CT Chest recommended if Age 90-80 years, 30 pack-year currently smoking OR have quit w/in 15years. Patient does not qualify. Breast: Up to date on Mammogram? No   Up to date of Bone Density/Dexa? No Colorectal: Yes    Plan:     Reviewed health maintenance screenings with patient today and relevant education, vaccines, and/or referrals were provided.    Continue doing brain stimulating activities (puzzles, reading, adult coloring books, staying active) to keep memory sharp.    Continue to eat heart healthy diet (full of fruits, vegetables, whole grains, lean protein, water--limit salt, fat, and sugar intake) and increase physical activity as tolerated.  I have personally reviewed and noted the following in the patient's chart:   . Medical and social history . Use of alcohol, tobacco or illicit drugs  . Current medications and supplements . Functional ability and status . Nutritional status . Physical activity . Advanced directives . List of other physicians . Hospitalizations, surgeries, and ER visits in previous 12 months . Vitals . Screenings to include cognitive, depression, and falls . Referrals and appointments  In addition, I have reviewed and discussed with  patient certain preventive protocols, quality metrics, and best practice recommendations. A written personalized care plan for preventive  services as well as general preventive health recommendations were provided to patient.     Sheral Flow, LPN   5/68/1275  Nurse Health Advisor

## 2019-08-09 NOTE — Assessment & Plan Note (Signed)
Chronic °Diet controlled °Check a1c °Low sugar / carb diet °Stressed regular exercise °Encouraged weight loss ° ° °

## 2019-08-09 NOTE — Assessment & Plan Note (Signed)
Chronic BP not ideally controlled, but has been out of hydralazine Restart hydralazine Continue current medications at current doses cmp

## 2019-08-09 NOTE — Patient Instructions (Signed)
Cynthia Forbes , Thank you for taking time to come for your Medicare Wellness Visit. I appreciate your ongoing commitment to your health goals. Please review the following plan we discussed and let me know if I can assist you in the future.   Screening recommendations/referrals: Colorectal Screening: was last done 08/17/2017; normal Mammogram: last done 10/12/2014; will schedule for 2021 Bone Density: never done  Vision and Dental Exams: Recommended annual ophthalmology exams for early detection of glaucoma and other disorders of the eye Recommended annual dental exams for proper oral hygiene  Diabetic Exams: Diabetic Eye Exam: will schedule for 2021 Diabetic Foot Exam: last done 08/09/2017  Vaccinations: Influenza vaccine: 01/05/2019 Pneumococcal vaccine: Prevnar 11/185/2019 Tdap vaccine: overdue, last done on 03/30/2007 Shingles vaccine: Please call your insurance company to determine your out of pocket expense for the Shingrix vaccine. You may receive this vaccine at your local pharmacy. Covid vaccine: Pfizer 04/21/2019, 05/12/2019  Advanced directives: Advance directives discussed with you today. I have provided a copy for you to complete at home and have notarized. Once this is complete please bring a copy in to our office so we can scan it into your chart.   Goals:  Recommend to drink at least 6-8 8oz glasses of water per day.  Recommend to exercise for at least 150 minutes per week.  Recommend to remove any items from the home that may cause slips or trips.  Recommend to decrease portion sizes by eating 3 small healthy meals and at least 2 healthy snacks per day.  Recommend to begin DASH diet as directed below  Recommend to continue efforts to reduce smoking habits until no longer smoking. Smoking Cessation literature is attached below.  Next appointment: Please schedule your Annual Wellness Visit with your Nurse Health Advisor in one year.  Preventive Care 67 Years and Older,  Female Preventive care refers to lifestyle choices and visits with your health care provider that can promote health and wellness. What does preventive care include?  A yearly physical exam. This is also called an annual well check.  Dental exams once or twice a year.  Routine eye exams. Ask your health care provider how often you should have your eyes checked.  Personal lifestyle choices, including:  Daily care of your teeth and gums.  Regular physical activity.  Eating a healthy diet.  Avoiding tobacco and drug use.  Limiting alcohol use.  Practicing safe sex.  Taking low-dose aspirin every day if recommended by your health care provider.  Taking vitamin and mineral supplements as recommended by your health care provider. What happens during an annual well check? The services and screenings done by your health care provider during your annual well check will depend on your age, overall health, lifestyle risk factors, and family history of disease. Counseling  Your health care provider may ask you questions about your:  Alcohol use.  Tobacco use.  Drug use.  Emotional well-being.  Home and relationship well-being.  Sexual activity.  Eating habits.  History of falls.  Memory and ability to understand (cognition).  Work and work Statistician.  Reproductive health. Screening  You may have the following tests or measurements:  Height, weight, and BMI.  Blood pressure.  Lipid and cholesterol levels. These may be checked every 5 years, or more frequently if you are over 82 years old.  Skin check.  Lung cancer screening. You may have this screening every year starting at age 67 if you have a 30-pack-year history of smoking and currently  smoke or have quit within the past 15 years.  Fecal occult blood test (FOBT) of the stool. You may have this test every year starting at age 67.  Flexible sigmoidoscopy or colonoscopy. You may have a sigmoidoscopy every 5  years or a colonoscopy every 10 years starting at age 67.  Hepatitis C blood test.  Hepatitis B blood test.  Sexually transmitted disease (STD) testing.  Diabetes screening. This is done by checking your blood sugar (glucose) after you have not eaten for a while (fasting). You may have this done every 1-3 years.  Bone density scan. This is done to screen for osteoporosis. You may have this done starting at age 67.  Mammogram. This may be done every 1-2 years. Talk to your health care provider about how often you should have regular mammograms. Talk with your health care provider about your test results, treatment options, and if necessary, the need for more tests. Vaccines  Your health care provider may recommend certain vaccines, such as:  Influenza vaccine. This is recommended every year.  Tetanus, diphtheria, and acellular pertussis (Tdap, Td) vaccine. You may need a Td booster every 10 years.  Zoster vaccine. You may need this after age 67.  Pneumococcal 13-valent conjugate (PCV13) vaccine. One dose is recommended after age 67.  Pneumococcal polysaccharide (PPSV23) vaccine. One dose is recommended after age 67. Talk to your health care provider about which screenings and vaccines you need and how often you need them. This information is not intended to replace advice given to you by your health care provider. Make sure you discuss any questions you have with your health care provider. Document Released: 04/11/2015 Document Revised: 12/03/2015 Document Reviewed: 01/14/2015 Elsevier Interactive Patient Education  2017 [employer].  Fall Prevention in the Home Falls can cause injuries. They can happen to people of all ages. There are many things you can do to make your home safe and to help prevent falls. What can I do on the outside of my home?  Regularly fix the edges of walkways and driveways and fix any cracks.  Remove anything that might make you trip as you walk through  a door, such as a raised step or threshold.  Trim any bushes or trees on the path to your home.  Use bright outdoor lighting.  Clear any walking paths of anything that might make someone trip, such as rocks or tools.  Regularly check to see if handrails are loose or broken. Make sure that both sides of any steps have handrails.  Any raised decks and porches should have guardrails on the edges.  Have any leaves, snow, or ice cleared regularly.  Use sand or salt on walking paths during winter.  Clean up any spills in your garage right away. This includes oil or grease spills. What can I do in the bathroom?  Use night lights.  Install grab bars by the toilet and in the tub and shower. Do not use towel bars as grab bars.  Use non-skid mats or decals in the tub or shower.  If you need to sit down in the shower, use a plastic, non-slip stool.  Keep the floor dry. Clean up any water that spills on the floor as soon as it happens.  Remove soap buildup in the tub or shower regularly.  Attach bath mats securely with double-sided non-slip rug tape.  Do not have throw rugs and other things on the floor that can make you trip. What can I do in  the bedroom?  Use night lights.  Make sure that you have a light by your bed that is easy to reach.  Do not use any sheets or blankets that are too big for your bed. They should not hang down onto the floor.  Have a firm chair that has side arms. You can use this for support while you get dressed.  Do not have throw rugs and other things on the floor that can make you trip. What can I do in the kitchen?  Clean up any spills right away.  Avoid walking on wet floors.  Keep items that you use a lot in easy-to-reach places.  If you need to reach something above you, use a strong step stool that has a grab bar.  Keep electrical cords out of the way.  Do not use floor polish or wax that makes floors slippery. If you must use wax, use  non-skid floor wax.  Do not have throw rugs and other things on the floor that can make you trip. What can I do with my stairs?  Do not leave any items on the stairs.  Make sure that there are handrails on both sides of the stairs and use them. Fix handrails that are broken or loose. Make sure that handrails are as long as the stairways.  Check any carpeting to make sure that it is firmly attached to the stairs. Fix any carpet that is loose or worn.  Avoid having throw rugs at the top or bottom of the stairs. If you do have throw rugs, attach them to the floor with carpet tape.  Make sure that you have a light switch at the top of the stairs and the bottom of the stairs. If you do not have them, ask someone to add them for you. What else can I do to help prevent falls?  Wear shoes that:  Do not have high heels.  Have rubber bottoms.  Are comfortable and fit you well.  Are closed at the toe. Do not wear sandals.  If you use a stepladder:  Make sure that it is fully opened. Do not climb a closed stepladder.  Make sure that both sides of the stepladder are locked into place.  Ask someone to hold it for you, if possible.  Clearly mark and make sure that you can see:  Any grab bars or handrails.  First and last steps.  Where the edge of each step is.  Use tools that help you move around (mobility aids) if they are needed. These include:  Canes.  Walkers.  Scooters.  Crutches.  Turn on the lights when you go into a dark area. Replace any light bulbs as soon as they burn out.  Set up your furniture so you have a clear path. Avoid moving your furniture around.  If any of your floors are uneven, fix them.  If there are any pets around you, be aware of where they are.  Review your medicines with your doctor. Some medicines can make you feel dizzy. This can increase your chance of falling. Ask your doctor what other things that you can do to help prevent falls. This  information is not intended to replace advice given to you by your health care provider. Make sure you discuss any questions you have with your health care provider. Document Released: 01/09/2009 Document Revised: 08/21/2015 Document Reviewed: 04/19/2014 Elsevier Interactive Patient Education  2017 [employer].

## 2019-09-14 ENCOUNTER — Other Ambulatory Visit: Payer: Self-pay | Admitting: Cardiovascular Disease

## 2019-11-21 ENCOUNTER — Encounter: Payer: Self-pay | Admitting: Cardiovascular Disease

## 2019-11-21 ENCOUNTER — Other Ambulatory Visit: Payer: Self-pay

## 2019-11-21 ENCOUNTER — Ambulatory Visit (INDEPENDENT_AMBULATORY_CARE_PROVIDER_SITE_OTHER): Payer: Medicare Other | Admitting: Cardiovascular Disease

## 2019-11-21 VITALS — BP 134/70 | HR 82 | Ht 68.0 in | Wt 215.4 lb

## 2019-11-21 DIAGNOSIS — E782 Mixed hyperlipidemia: Secondary | ICD-10-CM

## 2019-11-21 DIAGNOSIS — I10 Essential (primary) hypertension: Secondary | ICD-10-CM | POA: Diagnosis not present

## 2019-11-21 MED ORDER — ROSUVASTATIN CALCIUM 20 MG PO TABS: 20.0000 mg | ORAL_TABLET | Freq: Every day | ORAL | 3 refills | Status: DC

## 2019-11-21 MED ORDER — CARVEDILOL 25 MG PO TABS: ORAL_TABLET | ORAL | 3 refills | Status: DC

## 2019-11-21 MED ORDER — AMLODIPINE BESYLATE 10 MG PO TABS: 5.0000 mg | ORAL_TABLET | Freq: Two times a day (BID) | ORAL | 3 refills | Status: DC

## 2019-11-21 NOTE — Progress Notes (Signed)
Cardiology Office Note   Date:  11/21/2019   ID:  Cynthia, Forbes October 11, 1950, MRN [id number]  PCP:  Binnie Rail, MD  Cardiologist:  Skeet Latch, MD  Electrophysiologist:  None   Evaluation Performed:  Follow-Up Visit  Chief Complaint:  hypertension  History of Present Illness:    Cynthia Forbes is a 69 y.o. female hypertension, hyperlipidemia, diabetes, and mitral valve prolapse here for follow up.  She was first seen 06/25/16 for the evaluation of an abnormal EKG with anterolateral T wave inversions.  It was unchanged from 2015.  She has struggled with difficult to control BP.  Hydralazine was increased and she was started on HCTZ/triamterene.  She followed up with our pharmacist on 08/26/2016 and her blood pressure remained elevated.    At her last virtual appointment she was feeling well and her blood pressure was better controlled.  She had started exercising more.  However since then she saw her PCP and BP was elevated again.  She was out of hydralazine at the time.  This was restarted.  Since her last appointment she decided to leave her job.  Since then her BP improved significantly and she feels like a weight has been lifted. She still struggles with sleep.  Her family has been extremely supportive and threw her a party.  She has been working on thoroughly cleaning her house.  She feels fine with house work.  She has no chest pain and her breathing is stable.  She has no lower extremity edema, orhtopnea or PND.  She has some home exercise equipment that she plans to start using it soon.  She plans to start using it daily.  Her BP has been well-controlled at home.  It has been 121-136/74-88.  Past Medical History:  Diagnosis Date  . Bronchitis   . Diabetes mellitus without complication ([place])   . Hyperlipemia   . Hypertension   . Kidney stones 2005 & 08/2011    passed spontaneously X 2   Past Surgical History:  Procedure Laterality Date  . ABDOMINAL HYSTERECTOMY   10/05/11   Dr Nori Riis  . OOPHORECTOMY  10/05/11   degenerative fibroid; Dr Nori Riis  . TONSILLECTOMY    . vaginal bleed  10/19/11   hematoma; Dr Matthew Saras     Current Meds  Medication Sig  . albuterol (PROAIR HFA) 108 (90 Base) MCG/ACT inhaler Inhale 2 puffs into the lungs every 6 (six) hours as needed for wheezing or shortness of breath. Must keep appt for future refills  . amLODipine (NORVASC) 10 MG tablet Take 0.5 tablets (5 mg total) by mouth 2 (two) times daily.  Marland Kitchen aspirin 81 MG chewable tablet Chew 81 mg by mouth daily.  . Blood Pressure Monitor KIT 1 Package by Does not apply route daily. Check blood pressure as directed by pharmacist or physician. Diagnosis code I10  . carvedilol (COREG) 25 MG tablet TAKE 1 TABLET BY MOUTH TWICE DAILY WITH A MEAL  . hydrALAZINE (APRESOLINE) 50 MG tablet TAKE 1 TABLET BY MOUTH THREE TIMES DAILY  . hydrochlorothiazide (HYDRODIURIL) 12.5 MG tablet Take 12.5 mg by mouth daily.  . Misc Natural Products (AIRBORNE ELDERBERRY) CHEW daily  . rosuvastatin (CRESTOR) 20 MG tablet Take 1 tablet (20 mg total) by mouth daily.  . vitamin B-12 (CYANOCOBALAMIN) 1000 MCG tablet Take 1 tablet (1,000 mcg total) by mouth daily.  . vitamin C (ASCORBIC ACID) 250 MG tablet Take 1 tablet (250 mg total) by mouth daily.  . [DISCONTINUED]  amLODipine (NORVASC) 10 MG tablet Take 0.5 tablets (5 mg total) by mouth 2 (two) times daily.  . [DISCONTINUED] carvedilol (COREG) 25 MG tablet TAKE 1 TABLET BY MOUTH TWICE DAILY WITH A MEAL  . [DISCONTINUED] rosuvastatin (CRESTOR) 20 MG tablet Take 1 tablet (20 mg total) by mouth daily.     Allergies:   Codeine, Ivp dye [iodinated diagnostic agents], Benazepril, Benicar [olmesartan], Clonidine derivatives, and Strawberry extract   Social History   Tobacco Use  . Smoking status: Never Smoker  . Smokeless tobacco: Never Used  Vaping Use  . Vaping Use: Never used  Substance Use Topics  . Alcohol use: No  . Drug use: No     Family Hx: The  patient's family history includes Alzheimer's disease in her mother; Cancer in her brother and mother; Diabetes in her father; Kidney disease in her father; Liver cancer in her maternal grandfather; Prostate cancer in her father.  ROS:   Please see the history of present illness.    Hair loss   Prior CV studies:   The following studies were reviewed today:  n/a  Labs/Other Tests and Data Reviewed:    EKG:  An ECG dated 11/21/19 was personally reviewed today and demonstrated:  sinus rhythm.  Rate 82 bpm.  RBBB.  LAFB.  Recent Labs: 08/09/2019: ALT 15; BUN 13; Creatinine, Ser 0.91; Hemoglobin 12.9; Platelets 317.0; Potassium 3.9; Sodium 141   Recent Lipid Panel Lab Results  Component Value Date/Time   CHOL 180 08/09/2019 09:34 AM   CHOL 129 02/09/2018 09:13 AM   TRIG 98.0 08/09/2019 09:34 AM   HDL 46.70 08/09/2019 09:34 AM   HDL 49 02/09/2018 09:13 AM   CHOLHDL 4 08/09/2019 09:34 AM   LDLCALC 113 (H) 08/09/2019 09:34 AM   LDLCALC 68 02/09/2018 09:13 AM   LDLDIRECT 176.7 01/11/2013 08:21 AM    Wt Readings from Last 3 Encounters:  11/21/19 215 lb 6.4 oz (97.7 kg)  08/09/19 216 lb (98 kg)  08/09/19 216 lb 12.8 oz (98.3 kg)     Objective:    VS:  BP 134/70   Pulse 82   Ht '5\' 8"'  (1.727 m)   Wt 215 lb 6.4 oz (97.7 kg)   SpO2 97%   BMI 32.75 kg/m  , BMI Body mass index is 32.75 kg/m. GENERAL:  Well appearing HEENT: Pupils equal round and reactive, fundi not visualized, oral mucosa unremarkable NECK:  No jugular venous distention, waveform within normal limits, carotid upstroke brisk and symmetric, no bruits, no thyromegaly LYMPHATICS:  No cervical adenopathy LUNGS:  Clear to auscultation bilaterally HEART:  RRR.  PMI not displaced or sustained,S1 and S2 within normal limits, no S3, no S4, no clicks, no rubs, no murmurs ABD:  Flat, positive bowel sounds normal in frequency in pitch, no bruits, no rebound, no guarding, no midline pulsatile mass, no hepatomegaly, no  splenomegaly EXT:  2 plus pulses throughout, no edema, no cyanosis no clubbing SKIN:  No rashes no nodules NEURO:  Cranial nerves II through XII grossly intact, motor grossly intact throughout PSYCH:  Cognitively intact, oriented to person place and time   ASSESSMENT & PLAN:    # Hypertension: Blood pressure is mostly well-controlled. Continue amlodipine, carvedilol, hydralazine, and HCTZ.      # Hyperlipidemia: Continue rosuvastatin.  LDL 113.  She doesn't think she was taking rosuvastatin.  She will start back taking it and work on diet and exercise.  Goal is at least <100.  # Obesity: Cynthia Forbes was  encouraged to get back into her exercise routine.    Medication Adjustments/Labs and Tests Ordered: Current medicines are reviewed at length with the patient today.  Concerns regarding medicines are outlined above.   Tests Ordered: Orders Placed This Encounter  Procedures  . EKG 12-Lead    Medication Changes: Meds ordered this encounter  Medications  . amLODipine (NORVASC) 10 MG tablet    Sig: Take 0.5 tablets (5 mg total) by mouth 2 (two) times daily.    Dispense:  90 tablet    Refill:  3  . carvedilol (COREG) 25 MG tablet    Sig: TAKE 1 TABLET BY MOUTH TWICE DAILY WITH A MEAL    Dispense:  180 tablet    Refill:  3  . rosuvastatin (CRESTOR) 20 MG tablet    Sig: Take 1 tablet (20 mg total) by mouth daily.    Dispense:  90 tablet    Refill:  3    May use generic    Disposition:  Follow up in 1 year(s)  Signed, Skeet Latch, MD  11/21/2019 10:01 AM    [facility]

## 2019-11-21 NOTE — Patient Instructions (Signed)
Medication Instructions:  Your physician recommends that you continue on your current medications as directed. Please refer to the Current Medication list given to you today.   *If you need a refill on your cardiac medications before your next appointment, please call your pharmacy*  Lab Work: NONE   Testing/Procedures: NONE  Follow-Up: At [facility], you and your health needs are our priority.  As part of our continuing mission to provide you with exceptional heart care, we have created designated Provider Care Teams.  These Care Teams include your primary Cardiologist (physician) and Advanced Practice Providers (APPs -  Physician Assistants and Nurse Practitioners) who all work together to provide you with the care you need, when you need it.  We recommend signing up for the patient portal called "MyChart".  Sign up information is provided on this After Visit Summary.  MyChart is used to connect with patients for Virtual Visits (Telemedicine).  Patients are able to view lab/test results, encounter notes, upcoming appointments, etc.  Non-urgent messages can be sent to your provider as well.   To learn more about what you can do with MyChart, go to [email or web address].    Your next appointment:   12 month(s) You will receive a reminder letter in the mail two months in advance. If you don't receive a letter, please call our office to schedule the follow-up appointment.   The format for your next appointment:   In Person  Provider:   You may see Tiffany [place], MD or one of the following Advanced Practice Providers on your designated Care Team:    Luke Kilroy, PA-C  Callie Goodrich, PA-C  Jesse Cleaver, FNP    

## 2019-11-22 ENCOUNTER — Telehealth: Payer: Self-pay | Admitting: Cardiovascular Disease

## 2019-11-22 NOTE — Telephone Encounter (Signed)
Patient states after her appointment 8/25 she was told everything was normal, but on mychart her EKG was abnormal. She would like to know if this is an existing problem already discussed or if it is something else she needs to be aware of.

## 2019-11-22 NOTE — Telephone Encounter (Signed)
Returned call to patient, advised reviewed by Dr. Duke Salvia at her office visit.  Patient aware and verbalized udnerstanding

## 2020-06-01 ENCOUNTER — Other Ambulatory Visit: Payer: Self-pay

## 2020-06-01 ENCOUNTER — Ambulatory Visit (HOSPITAL_COMMUNITY)
Admission: EM | Admit: 2020-06-01 | Discharge: 2020-06-01 | Disposition: A | Discharge status: Home / Self Care | Payer: Medicare Other | Attending: Urgent Care | Admitting: Urgent Care

## 2020-06-01 ENCOUNTER — Encounter (HOSPITAL_COMMUNITY): Payer: Self-pay | Admitting: Emergency Medicine

## 2020-06-01 DIAGNOSIS — K0889 Other specified disorders of teeth and supporting structures: Secondary | ICD-10-CM | POA: Diagnosis not present

## 2020-06-01 DIAGNOSIS — M542 Cervicalgia: Secondary | ICD-10-CM

## 2020-06-01 DIAGNOSIS — K029 Dental caries, unspecified: Secondary | ICD-10-CM

## 2020-06-01 DIAGNOSIS — M62830 Muscle spasm of back: Secondary | ICD-10-CM

## 2020-06-01 DIAGNOSIS — K08109 Complete loss of teeth, unspecified cause, unspecified class: Secondary | ICD-10-CM

## 2020-06-01 DIAGNOSIS — M549 Dorsalgia, unspecified: Secondary | ICD-10-CM

## 2020-06-01 MED ORDER — AMOXICILLIN 875 MG PO TABS: 875.0000 mg | ORAL_TABLET | Freq: Two times a day (BID) | ORAL | 0 refills | Status: DC

## 2020-06-01 MED ORDER — NAPROXEN 500 MG PO TABS: 500.0000 mg | ORAL_TABLET | Freq: Two times a day (BID) | ORAL | 0 refills | Status: DC

## 2020-06-01 MED ORDER — TIZANIDINE HCL 4 MG PO TABS: 4.0000 mg | ORAL_TABLET | Freq: Three times a day (TID) | ORAL | 0 refills | Status: DC | PRN

## 2020-06-01 MED ORDER — AMOXICILLIN 875 MG PO TABS
875.0000 mg | ORAL_TABLET | Freq: Two times a day (BID) | ORAL | 0 refills | Status: DC
Start: 2020-06-01 — End: 2020-06-01

## 2020-06-01 NOTE — ED Triage Notes (Signed)
Pt presents with dental pain that radiates down into neck on left side. Pt states has seen dentist and has appt to have tooth removed.

## 2020-06-01 NOTE — ED Provider Notes (Signed)
[place]   MRN: [id number] DOB: 1950/09/06  Subjective:   Cynthia Forbes is a 70 y.o. female presenting for 3-day history of acute onset left-sided neck pain, upper back pain, trapezius pain.  Patient states that she feels knots and when she tries to press on them are very painful.  Has not used medications for relief.  She also has left-sided dental pain.  She scheduled an appointment with her dentist but wants to make sure she gets an antibiotic.  Denies falls, trauma, weakness, numbness or tingling, radicular symptoms.  Denies history of heart disease.  No kidney disease.  No current facility-administered medications for this encounter.  Current Outpatient Medications:  .  albuterol (PROAIR HFA) 108 (90 Base) MCG/ACT inhaler, Inhale 2 puffs into the lungs every 6 (six) hours as needed for wheezing or shortness of breath. Must keep appt for future refills, Disp: 18 g, Rfl: 8 .  amLODipine (NORVASC) 10 MG tablet, Take 0.5 tablets (5 mg total) by mouth 2 (two) times daily., Disp: 90 tablet, Rfl: 3 .  aspirin 81 MG chewable tablet, Chew 81 mg by mouth daily., Disp: , Rfl:  .  Blood Pressure Monitor KIT, 1 Package by Does not apply route daily. Check blood pressure as directed by pharmacist or physician. Diagnosis code I10, Disp: 1 each, Rfl: 0 .  carvedilol (COREG) 25 MG tablet, TAKE 1 TABLET BY MOUTH TWICE DAILY WITH A MEAL, Disp: 180 tablet, Rfl: 3 .  hydrALAZINE (APRESOLINE) 50 MG tablet, TAKE 1 TABLET BY MOUTH THREE TIMES DAILY, Disp: 270 tablet, Rfl: 1 .  hydrochlorothiazide (HYDRODIURIL) 12.5 MG tablet, Take 12.5 mg by mouth daily., Disp: , Rfl:  .  Misc Natural Products (AIRBORNE ELDERBERRY) CHEW, daily, Disp:  , Rfl:  .  rosuvastatin (CRESTOR) 20 MG tablet, Take 1 tablet (20 mg total) by mouth daily., Disp: 90 tablet, Rfl: 3 .  vitamin B-12 (CYANOCOBALAMIN) 1000 MCG tablet, Take 1 tablet (1,000 mcg total) by mouth daily., Disp:  , Rfl:  .  vitamin C (ASCORBIC ACID)  250 MG tablet, Take 1 tablet (250 mg total) by mouth daily., Disp:  , Rfl:    Allergies  Allergen Reactions  . Codeine     Rash , tachycardia & dyspnea Able to take Percocet  . Ivp Dye [Iodinated Diagnostic Agents] Hives and Shortness Of Breath  . Benazepril     Heart racing, sob  . Benicar [Olmesartan]     Hair loss D/Ced after samples  . Clonidine Derivatives     See 08/07/13 dizziness, dry mouth/eyes, insomnia  . Strawberry Extract Hives    Past Medical History:  Diagnosis Date  . Bronchitis   . Diabetes mellitus without complication ([place])   . Hyperlipemia   . Hypertension   . Kidney stones 2005 & 08/2011    passed spontaneously X 2     Past Surgical History:  Procedure Laterality Date  . ABDOMINAL HYSTERECTOMY  10/05/11   Dr Nori Riis  . OOPHORECTOMY  10/05/11   degenerative fibroid; Dr Nori Riis  . TONSILLECTOMY    . vaginal bleed  10/19/11   hematoma; Dr Matthew Saras    Family History  Problem Relation Age of Onset  . Alzheimer's disease Mother   . Cancer Mother   . Kidney disease Father   . Diabetes Father   . Prostate cancer Father   . Cancer Brother        spine  . Liver cancer Maternal Grandfather  Social History   Tobacco Use  . Smoking status: Never Smoker  . Smokeless tobacco: Never Used  Vaping Use  . Vaping Use: Never used  Substance Use Topics  . Alcohol use: No  . Drug use: No    ROS   Objective:   Vitals: BP (!) 142/98 (BP Location: Right Arm)   Pulse (!) 105   Temp 99.1 F (37.3 C) (Oral)   Resp 16   SpO2 98%   Physical Exam Constitutional:      General: She is not in acute distress.    Appearance: Normal appearance. She is well-developed. She is not ill-appearing, toxic-appearing or diaphoretic.  HENT:     Head: Normocephalic and atraumatic.      Comments: Multiple upper and lower missing teeth.  The remaining molars on the left lower side and right lower side both show evidence of severe disease including gingival recession and  tenderness about the gumline.    Nose: Nose normal.     Mouth/Throat:     Mouth: Mucous membranes are moist.     Pharynx: Oropharynx is clear.  Eyes:     General: No scleral icterus.       Right eye: No discharge.        Left eye: No discharge.     Extraocular Movements: Extraocular movements intact.     Pupils: Pupils are equal, round, and reactive to light.  Cardiovascular:     Rate and Rhythm: Normal rate.  Pulmonary:     Effort: Pulmonary effort is normal.  Musculoskeletal:       Back:     Comments: Full range of motion at the cervical region.  Significant spasms that are tender over area outlined.  No midline tenderness.  No rash.  Skin:    General: Skin is warm and dry.  Neurological:     General: No focal deficit present.     Mental Status: She is alert and oriented to person, place, and time.  Psychiatric:        Mood and Affect: Mood normal.        Behavior: Behavior normal.        Thought Content: Thought content normal.        Judgment: Judgment normal.       Assessment and Plan :   PDMP not reviewed this encounter.  1. Pain, dental   2. Pain due to dental caries   3. Teeth missing   4. Upper back pain   5. Neck pain   6. Muscle spasm of back     Start amoxicillin to address possible dental infection.  Maintain follow-up with dentist.  Schedule Tylenol and use naproxen for pain control.  Offered patient Toradol injection in clinic for severe pain but she refused.  Patient has severe allergy to codeine and therefore will avoid this.  Patient also requested printed scripts which I provided to her.  Counseled patient on potential for adverse effects with medications prescribed/recommended today, ER and return-to-clinic precautions discussed, patient verbalized understanding.    Jaynee Eagles, PA-C 06/01/20 1751

## 2020-06-11 DIAGNOSIS — E119 Type 2 diabetes mellitus without complications: Secondary | ICD-10-CM | POA: Diagnosis not present

## 2020-06-11 LAB — HM DIABETES EYE EXAM

## 2020-06-26 ENCOUNTER — Encounter: Payer: Self-pay | Admitting: Internal Medicine

## 2020-06-26 NOTE — Progress Notes (Signed)
Outside notes received. Information abstracted. Notes sent to scan.  

## 2020-09-02 ENCOUNTER — Other Ambulatory Visit: Payer: Self-pay | Admitting: Cardiovascular Disease

## 2020-09-17 ENCOUNTER — Other Ambulatory Visit: Payer: Self-pay

## 2020-09-18 ENCOUNTER — Encounter: Payer: Self-pay | Admitting: Internal Medicine

## 2020-09-18 ENCOUNTER — Ambulatory Visit (INDEPENDENT_AMBULATORY_CARE_PROVIDER_SITE_OTHER): Payer: Medicare (Managed Care) | Admitting: Internal Medicine

## 2020-09-18 VITALS — BP 158/86 | HR 86

## 2020-09-18 DIAGNOSIS — R351 Nocturia: Secondary | ICD-10-CM | POA: Diagnosis not present

## 2020-09-18 DIAGNOSIS — R350 Frequency of micturition: Secondary | ICD-10-CM

## 2020-09-18 DIAGNOSIS — F419 Anxiety disorder, unspecified: Secondary | ICD-10-CM | POA: Diagnosis not present

## 2020-09-18 DIAGNOSIS — M79601 Pain in right arm: Secondary | ICD-10-CM

## 2020-09-18 DIAGNOSIS — R42 Dizziness and giddiness: Secondary | ICD-10-CM | POA: Diagnosis not present

## 2020-09-18 LAB — URINALYSIS, ROUTINE W REFLEX MICROSCOPIC
Bilirubin Urine: NEGATIVE
Hgb urine dipstick: NEGATIVE
Ketones, ur: NEGATIVE
Leukocytes,Ua: NEGATIVE
Nitrite: NEGATIVE
RBC / HPF: NONE SEEN (ref 0–?)
Specific Gravity, Urine: 1.025 (ref 1.000–1.030)
Total Protein, Urine: NEGATIVE
Urine Glucose: NEGATIVE
Urobilinogen, UA: 0.2 (ref 0.0–1.0)
pH: 5.5 (ref 5.0–8.0)

## 2020-09-18 MED ORDER — MECLIZINE HCL 12.5 MG PO TABS: 12.5000 mg | ORAL_TABLET | Freq: Three times a day (TID) | ORAL | 1 refills | Status: AC | PRN

## 2020-09-18 NOTE — Progress Notes (Signed)
Patient ID: Cynthia Forbes, female   DOB: 04-Oct-1950, 70 y.o.   MRN: [id number]        Chief Complaint: follow up vertigo, nocturia, right bicep/elbow pain and swelling, anxiety       HPI:  Cynthia Forbes is a 70 y.o. female here to f/u and first mentions multiple recent stressors dating back over the year, including mother and brother died, but also most recently now her rent has gone from $1051 to $1500 per month, which she cannot afford and now beside herself with worry.  Denies worsening depressive symptoms, suicidal ideation, or panic but everything else seems worse after this, including new 2 wks onset positional dizziness with turning over or sitting up in bed that only happens first thing in the AM, and better the rest of the day; without HA, ear pain, tinnitus, reduced hearing and no other focal neuro s/s.  Also has 2 wks onset possibly mild worsening nocturia 2-3 times per night, but has been drinking more fluids, and Denies urinary symptoms such as dysuria, other frequency, urgency, flank pain, hematuria or n/v, fever, chills. Also mentions a painful swelling to the medial aspect of the right anterior elbow after a watermelon she was carrying slipped and fell on the elbow area about 1 wk ago, and pain/sweling persist and unable to supinate/pronate arm due to pain still, but o/w neurovasc intact.         Wt Readings from Last 3 Encounters:  11/21/19 215 lb 6.4 oz (97.7 kg)  08/09/19 216 lb (98 kg)  08/09/19 216 lb 12.8 oz (98.3 kg)   BP Readings from Last 3 Encounters:  09/19/20 124/83  09/18/20 (!) 158/86  06/01/20 (!) 142/98         Past Medical History:  Diagnosis Date   Bronchitis    Diabetes mellitus without complication ([place])    Hyperlipemia    Hypertension    Kidney stones 2005 & 08/2011    passed spontaneously X 2   Past Surgical History:  Procedure Laterality Date   ABDOMINAL HYSTERECTOMY  10/05/11   Dr Nori Riis   OOPHORECTOMY  10/05/11   degenerative fibroid; Dr Nori Riis    TONSILLECTOMY     vaginal bleed  10/19/11   hematoma; Dr Matthew Saras    reports that she has never smoked. She has never used smokeless tobacco. She reports that she does not drink alcohol and does not use drugs. family history includes Alzheimer's disease in her mother; Cancer in her brother and mother; Diabetes in her father; Kidney disease in her father; Liver cancer in her maternal grandfather; Prostate cancer in her father. Allergies  Allergen Reactions   Codeine     Rash , tachycardia & dyspnea Able to take Percocet   Ivp Dye [Iodinated Diagnostic Agents] Hives and Shortness Of Breath   Benazepril     Heart racing, sob   Benicar [Olmesartan]     Hair loss D/Ced after samples   Clonidine Derivatives     See 08/07/13 dizziness, dry mouth/eyes, insomnia   Strawberry Extract Hives   Current Outpatient Medications on File Prior to Visit  Medication Sig Dispense Refill   albuterol (PROAIR HFA) 108 (90 Base) MCG/ACT inhaler Inhale 2 puffs into the lungs every 6 (six) hours as needed for wheezing or shortness of breath. Must keep appt for future refills 18 g 8   amLODipine (NORVASC) 10 MG tablet Take 0.5 tablets (5 mg total) by mouth 2 (two) times daily. 90 tablet 3  aspirin 81 MG chewable tablet Chew 81 mg by mouth daily.     Blood Pressure Monitor KIT 1 Package by Does not apply route daily. Check blood pressure as directed by pharmacist or physician. Diagnosis code I10 1 each 0   carvedilol (COREG) 25 MG tablet TAKE 1 TABLET BY MOUTH TWICE DAILY WITH A MEAL 90 tablet 0   hydrALAZINE (APRESOLINE) 50 MG tablet TAKE 1 TABLET BY MOUTH THREE TIMES DAILY 270 tablet 1   Misc Natural Products (AIRBORNE ELDERBERRY) CHEW daily     rosuvastatin (CRESTOR) 20 MG tablet Take 1 tablet (20 mg total) by mouth daily. 90 tablet 3   vitamin B-12 (CYANOCOBALAMIN) 1000 MCG tablet Take 1 tablet (1,000 mcg total) by mouth daily.     vitamin C (ASCORBIC ACID) 250 MG tablet Take 1 tablet (250 mg total) by mouth  daily.     No current facility-administered medications on file prior to visit.        ROS:  All others reviewed and negative.  Objective        PE:  BP (!) 158/86 (BP Location: Left Arm)   Pulse 86   SpO2 97%                 Constitutional: Pt appears in NAD               HENT: Head: NCAT.                Right Ear: External ear normal.                 Left Ear: External ear normal. ; left TM with mild erythema c/w ongoing allergy and eustachian dysfxn               Eyes: . Pupils are equal, round, and reactive to light. Conjunctivae and EOM are normal               Nose: without d/c or deformity               Neck: Neck supple. Gross normal ROM               Cardiovascular: Normal rate and regular rhythm.                 Pulmonary/Chest: Effort normal and breath sounds without rales or wheezing.                Right elbow at the anterior medial aspect with diffuse swelling tender possibly worse at the medial bicep insertion site, o/w neurovasc intact, but pt does not want to pronate due to pain               Abd, soft, + BS, NT - benign, no flank tender               Neurological: Pt is alert. At baseline orientation, motor grossly intact, no tremor, gait normal               Skin: Skin is warm. No rashes, no other new lesions, LE edema - none               Psychiatric: Pt behavior is normal without agitation , 1+ nervous  Micro: none  Cardiac tracings I have personally interpreted today:  none  Pertinent Radiological findings (summarize): none   Lab Results  Component Value Date   WBC 7.5 08/09/2019   HGB 12.9 08/09/2019   HCT 40.2 08/09/2019  PLT 317.0 08/09/2019   GLUCOSE 128 (H) 08/09/2019   CHOL 180 08/09/2019   TRIG 98.0 08/09/2019   HDL 46.70 08/09/2019   LDLDIRECT 176.7 01/11/2013   LDLCALC 113 (H) 08/09/2019   ALT 15 08/09/2019   AST 18 08/09/2019   NA 141 08/09/2019   K 3.9 08/09/2019   CL 104 08/09/2019   CREATININE 0.91 08/09/2019   BUN 13 08/09/2019    CO2 30 08/09/2019   TSH 1.39 08/12/2017   HGBA1C 7.8 (H) 08/09/2019   MICROALBUR 3.2 (H) 08/09/2019   Assessment/Plan:  [name] is a 70 y.o. Black or African American [2] female with  has a past medical history of Bronchitis, Diabetes mellitus without complication ([place]), Hyperlipemia, Hypertension, and Kidney stones (2005 & 08/2011).  Vertigo C/w peripheral origin likely , possbly left ear allergy/eustachian tube dysfxn, educated, reassured, symptoms are minor but ok for antivert prn,  to f/u any worsening symptoms or concerns  Right arm pain C/w post traumatic bicipital tendonitis, for topical volt gel prn, and consider f/u with sports medicine if not better in 1-2 wks  Nocturia Exam benign, advised to limit fluids prior to bedtime, also for UA   Anxiety With recent multiple stressors including higher rent - declines need for referral for counseling or other med tx for now  Followup: Return if symptoms worsen or fail to improve.  Cathlean Cower, MD 09/21/2020 3:51 PM [facility]

## 2020-09-18 NOTE — Patient Instructions (Signed)
Please take all new medication as prescribed - the meclizine as needed for vertigo  You can also use the OTC Voltaren gel for pain to the right arm as needed; please see sport medicine on the first floor if the arm is worsening  Please continue all other medications as before, and refills have been done if requested.  Please have the pharmacy call with any other refills you may need.  Please keep your appointments with your specialists as you may have planned  Please go to the LAB at the blood drawing area for the tests to be done  You will be contacted by phone if any changes need to be made immediately.  Otherwise, you will receive a letter about your results with an explanation, but please check with MyChart first.  Please remember to sign up for MyChart if you have not done so, as this will be important to you in the future with finding out test results, communicating by private email, and scheduling acute appointments online when needed.

## 2020-09-19 ENCOUNTER — Ambulatory Visit
Admission: EM | Admit: 2020-09-19 | Discharge: 2020-09-19 | Disposition: A | Discharge status: Home / Self Care | Payer: Medicare (Managed Care) | Attending: Emergency Medicine | Admitting: Emergency Medicine

## 2020-09-19 ENCOUNTER — Other Ambulatory Visit: Payer: Self-pay

## 2020-09-19 ENCOUNTER — Encounter: Payer: Self-pay | Admitting: Emergency Medicine

## 2020-09-19 DIAGNOSIS — R42 Dizziness and giddiness: Secondary | ICD-10-CM

## 2020-09-19 DIAGNOSIS — H8112 Benign paroxysmal vertigo, left ear: Secondary | ICD-10-CM | POA: Diagnosis not present

## 2020-09-19 DIAGNOSIS — H6122 Impacted cerumen, left ear: Secondary | ICD-10-CM

## 2020-09-19 MED ORDER — PREDNISONE 20 MG PO TABS: 40.0000 mg | ORAL_TABLET | Freq: Every day | ORAL | 0 refills | Status: AC

## 2020-09-19 MED ORDER — CARBAMIDE PEROXIDE 6.5 % OT SOLN: 5.0000 [drp] | Freq: Two times a day (BID) | OTIC | 0 refills | Status: AC

## 2020-09-19 NOTE — ED Triage Notes (Signed)
Patient c/o vertigo x 14 weeks.   Patient states symptoms are worse in the morning. Patient states " when I get up, I have to go back and sit down".   Patient states symptoms have progressively become worst.   Patient denies Chest Pain or SOB.   Patient was prescribed Meclizine with no relief of symptoms.   History of HTN.

## 2020-09-19 NOTE — ED Provider Notes (Signed)
[name]    CSN: [id number] Arrival date & time: 09/19/20  1410      History   Chief Complaint Chief Complaint  Patient presents with   Dizziness    HPI Cynthia Forbes is a 70 y.o. female history of hypertension, hyperlipidemia, DM type II presenting today for evaluation of dizziness.  Reports vertigo x14 weeks.  Reports symptoms worse first thing in the morning.  Also noticing symptoms with bending over.  Reports sensation of movement, falling.  Denies any associated nausea or vomiting.  Denies vision changes.  Denies associated headaches.  Symptoms more persistent and prominent over the past few days.  She denies chest pain or shortness of breath.  Denies one-sided weakness.  Denies any recent URI symptoms.  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      Home Medications    Prior to Admission medications   Medication Sig Start Date End Date Taking? Authorizing Provider  amLODipine (NORVASC) 10 MG tablet Take 0.5 tablets (5 mg total) by mouth 2 (two) times daily. 11/21/19  Yes Skeet Latch, MD  aspirin 81 MG chewable tablet Chew 81 mg by mouth daily.   Yes [provider]  carbamide peroxide (DEBROX) 6.5 % OTIC solution Place 5 drops into the left ear 2 (two) times daily for 4 days. 09/19/20 09/23/20 Yes Caytlyn [place]vers C, PA-C  carvedilol (COREG) 25 MG tablet TAKE 1 TABLET BY MOUTH TWICE DAILY WITH A MEAL 09/03/20  Yes Skeet Latch, MD  hydrALAZINE (APRESOLINE) 50 MG tablet TAKE 1 TABLET BY MOUTH THREE TIMES DAILY 08/09/19  Yes Burns, Claudina Lick, MD  meclizine (ANTIVERT) 12.5 MG tablet Take 1 tablet (12.5 mg total) by mouth 3 (three) times daily as needed for dizziness. 09/18/20 09/18/21 Yes Biagio Borg, MD  predniSONE (DELTASONE) 20 MG tablet Take 2 tablets (40 mg total) by mouth daily for 5 days. 09/19/20 09/24/20 Yes Kastin Cerda C, PA-C  rosuvastatin (CRESTOR) 20 MG tablet Take 1 tablet (20 mg total) by mouth daily. 11/21/19  Yes Skeet Latch, MD  vitamin B-12 (CYANOCOBALAMIN) 1000 MCG tablet Take 1 tablet (1,000 mcg total) by mouth daily. 08/09/19  Yes Burns, Claudina Lick, MD  vitamin C (ASCORBIC ACID) 250 MG tablet Take 1 tablet (250 mg total) by mouth daily. 08/09/19  Yes Burns, Claudina Lick, MD  albuterol (PROAIR HFA) 108 (90 Base) MCG/ACT inhaler Inhale 2 puffs into the lungs every 6 (six) hours as needed for wheezing or  shortness of breath. Must keep appt for future refills 02/13/18   Binnie Rail, MD  Blood Pressure Monitor KIT 1 Package by Does not apply route daily. Check blood pressure as directed by pharmacist or physician. Diagnosis code I10 08/26/16   Skeet Latch, MD  Misc Natural Products (AIRBORNE Kendall Flack) CHEW daily 08/09/19   Binnie Rail, MD    Family History Family History  Problem Relation Age of Onset   Alzheimer's disease Mother    Cancer Mother    Kidney disease Father    Diabetes Father    Prostate cancer Father    Cancer Brother        spine   Liver cancer Maternal Grandfather     Social History Social History   Tobacco Use    Smoking status: Never   Smokeless tobacco: Never  Vaping Use   Vaping Use: Never used  Substance Use Topics   Alcohol use: No   Drug use: No     Allergies   Codeine, Ivp dye [iodinated diagnostic agents], Benazepril, Benicar [olmesartan], Clonidine derivatives, and Strawberry extract   Review of Systems Review of Systems  Constitutional:  Negative for activity change, appetite change, chills, fatigue and fever.  HENT:  Negative for congestion, ear pain, rhinorrhea, sinus pressure, sore throat and trouble swallowing.   Eyes:  Negative for discharge and redness.  Respiratory:  Negative for cough, chest tightness and shortness of breath.   Cardiovascular:  Negative for chest pain.  Gastrointestinal:  Negative for abdominal pain, diarrhea, nausea and vomiting.  Musculoskeletal:  Negative for myalgias.  Skin:  Negative for rash.  Neurological:  Positive for dizziness. Negative for light-headedness and headaches.    Physical Exam Triage Vital Signs ED Triage Vitals  Enc Vitals Group     BP 09/19/20 1508 124/83     Pulse Rate 09/19/20 1508 88     Resp 09/19/20 1508 15     Temp 09/19/20 1512 99.1 F (37.3 C)     Temp Source 09/19/20 1508 Oral     SpO2 09/19/20 1508 95 %     Weight --      Height --      Head Circumference --      Peak Flow --      Pain Score 09/19/20 1505 0     Pain Loc --      Pain Edu? --      Excl. in [place]? --    Orthostatic VS for the past 24 hrs:  BP- Lying Pulse- Lying BP- Sitting Pulse- Sitting BP- Standing at 0 minutes Pulse- Standing at 0 minutes  09/19/20 1511 124/83 88 130/87 84 145/90 86    Updated Vital Signs BP 124/83 (BP Location: Left Arm)   Pulse 88   Temp 99.1 F (37.3 C) (Oral)   Resp 15   SpO2 95%   Visual Acuity Right Eye Distance:   Left Eye Distance:   Bilateral Distance:    Right Eye Near:   Left Eye Near:    Bilateral Near:     Physical Exam Vitals and nursing note reviewed.  Constitutional:      Appearance:  She is well-developed.     Comments: No acute distress  HENT:     Head: Normocephalic and atraumatic.     Ears:     Comments: Left TM not visualized due to cerumen impaction, TM not visualized after irrigation, continues with partial impaction, canal slightly erythematous and macerated after irrigation/curette use  Nose: Nose normal.  Eyes:     Conjunctiva/sclera: Conjunctivae normal.  Cardiovascular:     Rate and Rhythm: Normal rate and regular rhythm.  Pulmonary:     Effort: Pulmonary effort is normal. No respiratory distress.  Abdominal:     General: There is no distension.  Musculoskeletal:        General: Normal range of motion.     Cervical back: Neck supple.  Skin:    General: Skin is warm and dry.  Neurological:     General: No focal deficit present.     Mental Status: She is alert and oriented to person, place, and time. Mental status is at baseline.     Comments: Patient A&O x3, cranial nerves II-XII grossly intact, strength at shoulders, hips and knees 5/5, equal bilaterally.  Negative Romberg . Gait without abnormality.       UC Treatments / Results  Labs (all labs ordered are listed, but only abnormal results are displayed) Labs Reviewed - No data to display  EKG   Radiology No results found.  Procedures Procedures (including critical care time)  Medications Ordered in UC Medications - No data to display  Initial Impression / Assessment and Plan / UC Course  I have reviewed the triage vital signs and the nursing notes.  Pertinent labs & imaging results that were available during my care of the patient were reviewed by me and considered in my medical decision making (see chart for details).    Dizziness-symptoms are suggestive of vertigo, has been using meclizine without relief, associated left-sided cerumen impaction, difficulty with removal with irrigation, opted to stop as ear was becoming irritated and erythematous, encouraged follow-up with ENT  given symptoms times months and now worsening.  Trial of course of prednisone, discussed positional maneuvers at home of Epley/logroll.  Discussed strict return precautions. Patient verbalized understanding and is agreeable with plan.  Final Clinical Impressions(s) / UC Diagnoses   Final diagnoses:  Dizziness  Benign paroxysmal positional vertigo of left ear  Impacted cerumen of left ear     Discharge Instructions      Begin prednisone 40 mg x 5 days-take with food and earlier in the day if possible Use Debrox drops on left for further relief of wax buildup Please try Epley maneuver and/or logroll/BBQ maneuver- look up on youtube Please follow-up with ENT if symptoms persisting-contact below     ED Prescriptions     Medication Sig Dispense Auth. Provider   predniSONE (DELTASONE) 20 MG tablet Take 2 tablets (40 mg total) by mouth daily for 5 days. 10 tablet Manfred Laspina C, PA-C   carbamide peroxide (DEBROX) 6.5 % OTIC solution Place 5 drops into the left ear 2 (two) times daily for 4 days. 15 mL Travis Mastel, [place] C, PA-C      PDMP not reviewed this encounter.   Janith Lima, [place] 09/19/20 [phone]

## 2020-09-19 NOTE — Discharge Instructions (Addendum)
Begin prednisone 40 mg x 5 days-take with food and earlier in the day if possible Use Debrox drops on left for further relief of wax buildup Please try Epley maneuver and/or logroll/BBQ maneuver- look up on youtube Please follow-up with ENT if symptoms persisting-contact below

## 2020-09-20 ENCOUNTER — Encounter: Payer: Self-pay | Admitting: Internal Medicine

## 2020-09-20 ENCOUNTER — Other Ambulatory Visit: Payer: Self-pay | Admitting: Internal Medicine

## 2020-09-20 LAB — URINE CULTURE

## 2020-09-20 MED ORDER — CIPROFLOXACIN HCL 500 MG PO TABS: 500.0000 mg | ORAL_TABLET | Freq: Two times a day (BID) | ORAL | 0 refills | Status: AC

## 2020-09-21 ENCOUNTER — Encounter: Payer: Self-pay | Admitting: Internal Medicine

## 2020-09-21 NOTE — Assessment & Plan Note (Signed)
Exam benign, advised to limit fluids prior to bedtime, also for UA

## 2020-09-21 NOTE — Assessment & Plan Note (Signed)
With recent multiple stressors including higher rent - declines need for referral for counseling or other med tx for now

## 2020-09-21 NOTE — Assessment & Plan Note (Signed)
C/w peripheral origin likely , possbly left ear allergy/eustachian tube dysfxn, educated, reassured, symptoms are minor but ok for antivert prn,  to f/u any worsening symptoms or concerns

## 2020-09-21 NOTE — Assessment & Plan Note (Signed)
C/w post traumatic bicipital tendonitis, for topical volt gel prn, and consider f/u with sports medicine if not better in 1-2 wks

## 2020-09-21 NOTE — Progress Notes (Signed)
Subjective:    Patient ID: Cynthia Forbes, female    DOB: [birth year], 70 y.o.   MRN: [id number]  HPI The patient is here for follow up of their chronic medical problems, including DM, vertigo   Vertigo - Dr Jenny Reichmann - rx'd meclizine, cipro - rx'd for UTI.  Went to urgent care - impacted cerumen.  They tried to clear the ear, but were not 100% succcessful.  Rx'd prednisone and debrox.  She has been using the debrox and her ear is better - it is sore from them trying to clean it out.   Reviewed Dr Gwynn Burly note and urgent care note.    She is doing better with watching her sugars/carbs.  She does not use salt.  She is exercising - pedellar on the floor.    Medications and allergies reviewed with patient and updated if appropriate.  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 Must keep appt for future refills 18 g 8   amLODipine (NORVASC) 10 MG tablet Take 0.5 tablets (5 mg total) by mouth 2 (two) times daily. 90 tablet 3   aspirin 81 MG chewable tablet Chew 81 mg by mouth daily.     Blood Pressure Monitor KIT 1 Package by Does not apply route daily. Check blood pressure as directed by pharmacist or physician. Diagnosis code I10 1 each 0   carbamide peroxide (DEBROX) 6.5 % OTIC solution Place 5 drops into the  left ear 2 (two) times daily for 4 days. 15 mL 0   carvedilol (COREG) 25 MG tablet TAKE 1 TABLET BY MOUTH TWICE DAILY WITH A MEAL 90 tablet 0   ciprofloxacin (CIPRO) 500 MG tablet Take 1 tablet (500 mg total) by mouth 2 (two) times daily for 10 days. 20 tablet 0   hydrALAZINE (APRESOLINE) 50 MG tablet TAKE 1 TABLET BY MOUTH THREE TIMES DAILY 270 tablet 1   meclizine (ANTIVERT) 12.5 MG tablet Take 1 tablet (12.5 mg total) by mouth 3 (three) times daily as needed for dizziness. 30 tablet 1   predniSONE (DELTASONE) 20 MG tablet Take 2 tablets (40 mg total) by mouth daily for 5 days. 10 tablet 0   rosuvastatin (CRESTOR) 20 MG tablet Take 1 tablet (20 mg total) by mouth daily. 90 tablet 3   vitamin B-12 (CYANOCOBALAMIN) 1000 MCG tablet Take 1 tablet (1,000 mcg total) by mouth daily.     vitamin C (ASCORBIC ACID) 250 MG tablet Take 1 tablet (250 mg total) by mouth daily.     No current facility-administered medications on file prior to visit.    Past Medical History:  Diagnosis Date   Bronchitis    Diabetes mellitus without complication ([place])    Hyperlipemia    Hypertension    Kidney stones 2005 & 08/2011  passed spontaneously X 2    Past Surgical History:  Procedure Laterality Date   ABDOMINAL HYSTERECTOMY  10/05/11   Dr Nori Riis   OOPHORECTOMY  10/05/11   degenerative fibroid; Dr Nori Riis   TONSILLECTOMY     vaginal bleed  10/19/11   hematoma; Dr Matthew Saras    Social History   Socioeconomic History   Marital status: Divorced    Spouse name: Not on file   Number of children: Not on file   Years of education: Not on file   Highest education level: Not on file  Occupational History   Not on file  Tobacco Use   Smoking status: Never   Smokeless tobacco: Never  Vaping Use   Vaping Use: Never used  Substance and Sexual Activity   Alcohol use: No   Drug use: No   Sexual activity: Not Currently  Other Topics Concern   Not on file  Social History Narrative   Not on file   Social  Determinants of Health   Financial Resource Strain: Low Risk    Difficulty of Paying Living Expenses: Not hard at all  Food Insecurity: No Food Insecurity   Worried About Charity fundraiser in the Last Year: Never true   [place] in the Last Year: Never true  Transportation Needs: No Transportation Needs   Lack of Transportation (Medical): No   Lack of Transportation (Non-Medical): No  Physical Activity: Sufficiently Active   Days of Exercise per Week: 5 days   Minutes of Exercise per Session: 30 min  Stress: No Stress Concern Present   Feeling of Stress : Not at all  Social Connections: Moderately Integrated   Frequency of Communication with Friends and Family: More than three times a week   Frequency of Social Gatherings with Friends and Family: More than three times a week   Attends Religious Services: More than 4 times per year   Active Member of [employer] or Organizations: Yes   Attends Music therapist: More than 4 times per year   Marital Status: Divorced    Family History  Problem Relation Age of Onset   Alzheimer's disease Mother    Cancer Mother    Kidney disease Father    Diabetes Father    Prostate cancer Father    Cancer Brother        spine   Liver cancer Maternal Grandfather     Review of Systems  Constitutional:  Negative for fever.  HENT:  Positive for ear pain (left). Negative for hearing loss.   Respiratory:  Positive for cough (chronic - with changes in environment). Negative for shortness of breath and wheezing.   Cardiovascular:  Positive for palpitations (rare). Negative for chest pain and leg swelling.  Neurological:  Positive for dizziness. Negative for light-headedness, numbness and headaches.      Objective:   Vitals:   09/22/20 1451  BP: 130/80  Pulse: 68  Temp: 97.9 F (36.6 C)  SpO2: 97%   BP Readings from Last 3 Encounters:  09/22/20 130/80  09/22/20 130/80  09/19/20 124/83   Wt Readings from Last 3 Encounters:   09/22/20 214 lb (97.1 kg)  09/22/20 214 lb 9.6 oz (97.3 kg)  11/21/19 215 lb 6.4 oz (97.7 kg)   Body mass index is 32.54 kg/m.   Physical Exam Constitutional:      General: She is not in acute distress.    Appearance: Normal appearance. She is not ill-appearing.  HENT:  Head: Normocephalic and atraumatic.     Right Ear: Tympanic membrane, ear canal and external ear normal. There is no impacted cerumen.     Left Ear: External ear normal. There is no impacted cerumen.     Ears:     Comments: Small area of irritation of lower mid ear canal - small amt of blood, clump of wax deeper in ear canal - TM visualized - erythematous Eyes:     Conjunctiva/sclera: Conjunctivae normal.  Neck:     Vascular: No carotid bruit.  Cardiovascular:     Rate and Rhythm: Normal rate and regular rhythm.     Heart sounds: No murmur heard. Pulmonary:     Effort: Pulmonary effort is normal. No respiratory distress.     Breath sounds: No wheezing or rales.  Musculoskeletal:     Cervical back: Neck supple. No tenderness.     Right lower leg: No edema.     Left lower leg: No edema.  Lymphadenopathy:     Cervical: No cervical adenopathy.  Skin:    General: Skin is warm and dry.  Neurological:     Mental Status: She is alert.       Diabetic Foot Exam - Simple   Simple Foot Form Diabetic Foot exam was performed with the following findings: Yes 09/22/2020  7:30 PM  Visual Inspection No deformities, no ulcerations, no other skin breakdown bilaterally: Yes Sensation Testing Intact to touch and monofilament testing bilaterally: Yes Pulse Check Posterior Tibialis and Dorsalis pulse intact bilaterally: Yes Comments      Assessment & Plan:    See Problem List for Assessment and Plan of chronic medical problems.    This visit occurred during the SARS-CoV-2 public health emergency.  Safety protocols were in place, including screening questions prior to the visit, additional usage of staff PPE, and  extensive cleaning of exam room while observing appropriate contact time as indicated for disinfecting solutions.

## 2020-09-21 NOTE — Patient Instructions (Addendum)
  Blood work was ordered.      Medications changes include :   take the ciprofloxacin ( antibiotic) for 5 days only    Please followup in 6 months

## 2020-09-22 ENCOUNTER — Ambulatory Visit (INDEPENDENT_AMBULATORY_CARE_PROVIDER_SITE_OTHER): Payer: Medicare (Managed Care)

## 2020-09-22 ENCOUNTER — Encounter: Payer: Self-pay | Admitting: Internal Medicine

## 2020-09-22 ENCOUNTER — Other Ambulatory Visit: Payer: Self-pay

## 2020-09-22 ENCOUNTER — Ambulatory Visit (INDEPENDENT_AMBULATORY_CARE_PROVIDER_SITE_OTHER): Payer: Medicare (Managed Care) | Admitting: Internal Medicine

## 2020-09-22 VITALS — BP 130/80 | HR 80 | Temp 97.9°F | Ht 68.0 in | Wt 214.6 lb

## 2020-09-22 VITALS — BP 130/80 | HR 68 | Temp 97.9°F | Ht 68.0 in | Wt 214.0 lb

## 2020-09-22 DIAGNOSIS — J4520 Mild intermittent asthma, uncomplicated: Secondary | ICD-10-CM

## 2020-09-22 DIAGNOSIS — E538 Deficiency of other specified B group vitamins: Secondary | ICD-10-CM

## 2020-09-22 DIAGNOSIS — E782 Mixed hyperlipidemia: Secondary | ICD-10-CM

## 2020-09-22 DIAGNOSIS — R42 Dizziness and giddiness: Secondary | ICD-10-CM

## 2020-09-22 DIAGNOSIS — E559 Vitamin D deficiency, unspecified: Secondary | ICD-10-CM

## 2020-09-22 DIAGNOSIS — E1165 Type 2 diabetes mellitus with hyperglycemia: Secondary | ICD-10-CM

## 2020-09-22 DIAGNOSIS — N3000 Acute cystitis without hematuria: Secondary | ICD-10-CM | POA: Diagnosis not present

## 2020-09-22 DIAGNOSIS — Z0000 Encounter for general adult medical examination without abnormal findings: Secondary | ICD-10-CM

## 2020-09-22 DIAGNOSIS — I10 Essential (primary) hypertension: Secondary | ICD-10-CM

## 2020-09-22 DIAGNOSIS — N390 Urinary tract infection, site not specified: Secondary | ICD-10-CM | POA: Insufficient documentation

## 2020-09-22 LAB — COMPREHENSIVE METABOLIC PANEL
ALT: 51 U/L — ABNORMAL HIGH (ref 0–35)
AST: 60 U/L — ABNORMAL HIGH (ref 0–37)
Albumin: 4.4 g/dL (ref 3.5–5.2)
Alkaline Phosphatase: 98 U/L (ref 39–117)
BUN: 9 mg/dL (ref 6–23)
CO2: 31 mEq/L (ref 19–32)
Calcium: 9.8 mg/dL (ref 8.4–10.5)
Chloride: 102 mEq/L (ref 96–112)
Creatinine, Ser: 0.88 mg/dL (ref 0.40–1.20)
GFR: 67.71 mL/min (ref >60.00)
Glucose, Bld: 90 mg/dL (ref 70–99)
Potassium: 3.9 mEq/L (ref 3.5–5.1)
Sodium: 140 mEq/L (ref 135–145)
Total Bilirubin: 0.4 mg/dL (ref 0.2–1.2)
Total Protein: 8.7 g/dL — ABNORMAL HIGH (ref 6.0–8.3)

## 2020-09-22 LAB — LIPID PANEL
Cholesterol: 171 mg/dL (ref 0–200)
HDL: 51.4 mg/dL (ref >39.00)
LDL Cholesterol: 102 mg/dL — ABNORMAL HIGH (ref 0–99)
NonHDL: 119.87
Total CHOL/HDL Ratio: 3
Triglycerides: 88 mg/dL (ref 0.0–149.0)
VLDL: 17.6 mg/dL (ref 0.0–40.0)

## 2020-09-22 LAB — HEMOGLOBIN A1C: Hgb A1c MFr Bld: 7.9 % — ABNORMAL HIGH (ref 4.6–6.5)

## 2020-09-22 NOTE — Assessment & Plan Note (Signed)
Chronic Not controlled Trying to control sugars with lifestyle - last a1c one year ago 7.8% Check a1c Increase exercise, work on weight loss, low sugar/carb diet Would recommend medication if a1c is >7% Discussed consequences of uncontrolled sugar

## 2020-09-22 NOTE — Assessment & Plan Note (Signed)
Chronic Taking B12 daily 

## 2020-09-22 NOTE — Assessment & Plan Note (Signed)
Chronic Intermittent wheeze/tightness Related to allergies Continue albuterol inhaler prn

## 2020-09-22 NOTE — Assessment & Plan Note (Signed)
Subacute Improved a little Likely peripheral - related to head movements Continue meclizine 12.5 mg prn Will complete prednisone Will use debrox  Will try epley manuver

## 2020-09-22 NOTE — Assessment & Plan Note (Signed)
Chronic Check lipid panel  Continue crestor 20 mg daily Regular exercise and healthy diet encouraged  

## 2020-09-22 NOTE — Progress Notes (Signed)
Subjective:   Cynthia Forbes is a 70 y.o. female who presents for Medicare Annual (Subsequent) preventive examination.  Review of Systems     Cardiac Risk Factors include: advanced age (>27mn, >>60women);diabetes mellitus;dyslipidemia;hypertension;obesity (BMI >30kg/m2)     Objective:    Today's Vitals   09/22/20 1314  BP: 130/80  Pulse: 80  Temp: 97.9 F (36.6 C)  SpO2: 97%  Weight: 214 lb 9.6 oz (97.3 kg)  Height: '5\' 8"'  (1.727 m)  PainSc: 0-No pain   Body mass index is 32.63 kg/m.  Advanced Directives 09/22/2020 08/09/2019 01/22/2014 10/19/2011  Does Patient Have a Medical Advance Directive? No No No Patient does not have advance directive  Would patient like information on creating a medical advance directive? No - Patient declined Yes (ED - Information included in AVS) No - patient declined information -  Pre-existing out of facility DNR order (yellow form or pink MOST form) - - - No    Current Medications (verified) Outpatient Encounter Medications as of 09/22/2020  Medication Sig   albuterol (PROAIR HFA) 108 (90 Base) MCG/ACT inhaler Inhale 2 puffs into the lungs every 6 (six) hours as needed for wheezing or shortness of breath. Must keep appt for future refills   amLODipine (NORVASC) 10 MG tablet Take 0.5 tablets (5 mg total) by mouth 2 (two) times daily.   aspirin 81 MG chewable tablet Chew 81 mg by mouth daily.   Blood Pressure Monitor KIT 1 Package by Does not apply route daily. Check blood pressure as directed by pharmacist or physician. Diagnosis code I10   carbamide peroxide (DEBROX) 6.5 % OTIC solution Place 5 drops into the left ear 2 (two) times daily for 4 days.   carvedilol (COREG) 25 MG tablet TAKE 1 TABLET BY MOUTH TWICE DAILY WITH A MEAL   ciprofloxacin (CIPRO) 500 MG tablet Take 1 tablet (500 mg total) by mouth 2 (two) times daily for 10 days.   hydrALAZINE (APRESOLINE) 50 MG tablet TAKE 1 TABLET BY MOUTH THREE TIMES DAILY   meclizine (ANTIVERT) 12.5 MG  tablet Take 1 tablet (12.5 mg total) by mouth 3 (three) times daily as needed for dizziness.   predniSONE (DELTASONE) 20 MG tablet Take 2 tablets (40 mg total) by mouth daily for 5 days.   rosuvastatin (CRESTOR) 20 MG tablet Take 1 tablet (20 mg total) by mouth daily.   vitamin B-12 (CYANOCOBALAMIN) 1000 MCG tablet Take 1 tablet (1,000 mcg total) by mouth daily.   vitamin C (ASCORBIC ACID) 250 MG tablet Take 1 tablet (250 mg total) by mouth daily.   [DISCONTINUED] Misc Natural Products (AIRBORNE ELDERBERRY) CHEW daily   No facility-administered encounter medications on file as of 09/22/2020.    Allergies (verified) Codeine, Ivp dye [iodinated diagnostic agents], Benazepril, Benicar [olmesartan], Clonidine derivatives, and Strawberry extract   History: Past Medical History:  Diagnosis Date   Bronchitis    [place]    Hyperlipemia    Hypertension    Kidney stones 2005 & 08/2011    passed spontaneously X 2   Past Surgical History:  Procedure Laterality Date   ABDOMINAL HYSTERECTOMY  10/05/11   Dr NNori Riis  OOPHORECTOMY  10/05/11   degenerative fibroid; Dr NNori Riis  TONSILLECTOMY     vaginal bleed  10/19/11   hematoma; Dr HMatthew Saras  Family History  Problem Relation Age of Onset   Alzheimer's disease Mother    Cancer Mother    Kidney disease Father  Diabetes Father    Prostate cancer Father    Cancer Brother        spine   Liver cancer Maternal Grandfather    Social History   Socioeconomic History   Marital status: Divorced    Spouse name: Not on file   Number of children: Not on file   Years of education: Not on file   Highest education level: Not on file  Occupational History   Not on file  Tobacco Use   Smoking status: Never   Smokeless tobacco: Never  Vaping Use   Vaping Use: Never used  Substance and Sexual Activity   Alcohol use: No   Drug use: No   Sexual activity: Not Currently  Other Topics Concern   Not on file  Social  History Narrative   Not on file   Social Determinants of Health   Financial Resource Strain: Low Risk    Difficulty of Paying Living Expenses: Not hard at all  Food Insecurity: No Food Insecurity   Worried About Charity fundraiser in the Last Year: Never true   [place] in the Last Year: Never true  Transportation Needs: No Transportation Needs   Lack of Transportation (Medical): No   Lack of Transportation (Non-Medical): No  Physical Activity: Sufficiently Active   Days of Exercise per Week: 5 days   Minutes of Exercise per Session: 30 min  Stress: No Stress Concern Present   Feeling of Stress : Not at all  Social Connections: Moderately Integrated   Frequency of Communication with Friends and Family: More than three times a week   Frequency of Social Gatherings with Friends and Family: More than three times a week   Attends Religious Services: More than 4 times per year   Active Member of [employer] or Organizations: Yes   Attends Music therapist: More than 4 times per year   Marital Status: Divorced    Tobacco Counseling Counseling given: Not Answered   Clinical Intake:  Pre-visit preparation completed: Yes  Pain : No/denies pain Pain Score: 0-No pain     BMI - recorded: 32.54 Nutritional Status: BMI > 30  Obese Nutritional Risks: None Diabetes: Yes CBG done?: No CBG resulted in Enter/ Edit results?: No Did pt. bring in CBG monitor from home?: No  How often do you need to have someone help you when you read instructions, pamphlets, or other written materials from your doctor or pharmacy?: 1 - Never What is the last grade level you completed in school?: Associate's Degree in Radiology Tech.  Diabetic? yes  Interpreter Needed?: No  Information entered by :: Lisette Abu, LPN   Activities of Daily Living In your present state of health, do you have any difficulty performing the following activities: 09/22/2020 09/18/2020  Hearing? N N   Vision? N N  Difficulty concentrating or making decisions? N N  Walking or climbing stairs? N N  Dressing or bathing? N N  Doing errands, shopping? N N  Preparing Food and eating ? N -  Using the Toilet? N -  In the past six months, have you accidently leaked urine? N -  Do you have problems with loss of bowel control? N -  Managing your Medications? N -  Managing your Finances? N -  Housekeeping or managing your Housekeeping? N -  Some recent data might be hidden    Patient Care Team: Binnie Rail, MD as PCP - General (Internal Medicine) Skeet Latch, MD  as PCP - Cardiology (Cardiology)  Indicate any recent Medical Services you may have received from other than [facility] providers in the past year (date may be approximate).     Assessment:   This is a routine wellness examination for Cynthia Forbes.  Hearing/Vision screen No results found.  Dietary issues and exercise activities discussed: Current Exercise Habits: Home exercise routine, Type of exercise: strength training/weights;treadmill, Time (Minutes): 30, Frequency (Times/Week): 5, Weekly Exercise (Minutes/Week): 150, Intensity: Moderate, Exercise limited by: None identified   Goals Addressed             This Visit's Progress    Patient Stated       My goal is to start [place] so that I can get more physically active.       Depression Screen PHQ 2/9 Scores 09/22/2020 09/22/2020 09/18/2020 08/09/2019 08/09/2019 08/09/2017  PHQ - 2 Score 3 0 3 0 0 0  PHQ- 9 Score - - - - - 4    Fall Risk Fall Risk  09/22/2020 09/22/2020 09/18/2020 08/09/2019 08/09/2019  Falls in the past year? 0 0 0 0 0  Number falls in past yr: 0 0 0 0 0  Injury with Fall? 0 0 - 0 0  Risk for fall due to : No Fall Risks - - No Fall Risks -  Follow up Falls evaluation completed - - Falls evaluation completed;Education provided;Falls prevention discussed Falls evaluation completed    FALL RISK PREVENTION PERTAINING TO THE HOME:  Any  stairs in or around the home? Yes  If so, are there any without handrails? No  Home free of loose throw rugs in walkways, pet beds, electrical cords, etc? Yes  Adequate lighting in your home to reduce risk of falls? Yes   ASSISTIVE DEVICES UTILIZED TO PREVENT FALLS:  Life alert? No  Use of a cane, walker or w/c? No  Grab bars in the bathroom? No  Shower chair or bench in shower? No  Elevated toilet seat or a handicapped toilet? No   TIMED UP AND GO:  Was the test performed? Yes .  Length of time to ambulate 10 feet: 6 sec.   Gait steady and fast without use of assistive device  Cognitive Function: Normal cognitive status assessed by direct observation by this Nurse Health Advisor. No abnormalities found.       6CIT Screen 08/09/2019  What Year? 0 points  What month? 0 points  What time? 0 points  Count back from 20 0 points  Months in reverse 0 points  Repeat phrase 0 points  Total Score 0    Immunizations Immunization History  Administered Date(s) Administered   Fluad Quad(high Dose 65+) 01/05/2019   Influenza-Unspecified 11/28/2015, 01/20/2017, 01/03/2018   PFIZER(Purple Top)SARS-COV-2 Vaccination 04/18/2019, 05/12/2019   Pneumococcal Conjugate-13 02/13/2018   Tdap 03/30/2007    TDAP status: Due, Education has been provided regarding the importance of this vaccine. Advised may receive this vaccine at local pharmacy or Health Dept. Aware to provide a copy of the vaccination record if obtained from local pharmacy or Health Dept. Verbalized acceptance and understanding.  Flu Vaccine status: Due, Education has been provided regarding the importance of this vaccine. Advised may receive this vaccine at local pharmacy or Health Dept. Aware to provide a copy of the vaccination record if obtained from local pharmacy or Health Dept. Verbalized acceptance and understanding.  Pneumococcal vaccine status: Due, Education has been provided regarding the importance of this vaccine.  Advised may receive this vaccine at  local pharmacy or Health Dept. Aware to provide a copy of the vaccination record if obtained from local pharmacy or Health Dept. Verbalized acceptance and understanding.  Covid-19 vaccine status: Completed vaccines  Qualifies for Shingles Vaccine? Yes   Zostavax completed No   Shingrix Completed?: No.    Education has been provided regarding the importance of this vaccine. Patient has been advised to call insurance company to determine out of pocket expense if they have not yet received this vaccine. Advised may also receive vaccine at local pharmacy or Health Dept. Verbalized acceptance and understanding.  Screening Tests Health Maintenance  Topic Date Due   Zoster Vaccines- Shingrix (1 of 2) Never done   MAMMOGRAM  10/21/2016   TETANUS/TDAP  03/29/2017   DEXA SCAN  Never done   FOOT EXAM  08/10/2018   PNA vac Low Risk Adult (2 of 2 - PPSV23) 02/14/2019   COVID-19 Vaccine (3 - Pfizer risk series) 06/09/2019   HEMOGLOBIN A1C  02/09/2020   Fecal DNA (Cologuard)  08/17/2020   INFLUENZA VACCINE  10/27/2020   OPHTHALMOLOGY EXAM  06/11/2021   Hepatitis C Screening  Completed   HPV VACCINES  Aged Out    Health Maintenance  Health Maintenance Due  Topic Date Due   Zoster Vaccines- Shingrix (1 of 2) Never done   MAMMOGRAM  10/21/2016   TETANUS/TDAP  03/29/2017   DEXA SCAN  Never done   FOOT EXAM  08/10/2018   PNA vac Low Risk Adult (2 of 2 - PPSV23) 02/14/2019   COVID-19 Vaccine (3 - Pfizer risk series) 06/09/2019   HEMOGLOBIN A1C  02/09/2020   Fecal DNA (Cologuard)  08/17/2020    Colorectal cancer screening: Type of screening: Cologuard. Completed 08/17/2017. Repeat every 3 years  Mammogram status: Completed 10/22/2014. Repeat every year  Bone density status: never done  Lung Cancer Screening: (Low Dose CT Chest recommended if Age 19-80 years, 30 pack-year currently smoking OR have quit w/in 15years.) does not qualify.   Lung Cancer  Screening Referral: no  Additional Screening:  Hepatitis C Screening: does qualify; Completed yes  Vision Screening: Recommended annual ophthalmology exams for early detection of glaucoma and other disorders of the eye. Is the patient up to date with their annual eye exam?  Yes  Who is the provider or what is the name of the office in which the patient attends annual eye exams? [facility] If pt is not established with a provider, would they like to be referred to a provider to establish care? No .   Dental Screening: Recommended annual dental exams for proper oral hygiene  Community Resource Referral / Chronic Care Management: CRR required this visit?  No   CCM required this visit?  No      Plan:     I have personally reviewed and noted the following in the patient's chart:   Medical and social history Use of alcohol, tobacco or illicit drugs  Current medications and supplements including opioid prescriptions.  Functional ability and status Nutritional status Physical activity Advanced directives List of other physicians Hospitalizations, surgeries, and ER visits in previous 12 months Vitals Screenings to include cognitive, depression, and falls Referrals and appointments  In addition, I have reviewed and discussed with patient certain preventive protocols, quality metrics, and best practice recommendations. A written personalized care plan for preventive services as well as general preventive health recommendations were provided to patient.     Sheral Flow, LPN   [id number]   Nurse  Notes: n/a

## 2020-09-22 NOTE — Assessment & Plan Note (Signed)
Chronic BP well controlled Continue amlodipine 5 mg bid, coreg 25 mg bid, hydralazine 50 mg tid cmp

## 2020-09-22 NOTE — Assessment & Plan Note (Signed)
Chronic Taking vitamin d daily 

## 2020-09-22 NOTE — Patient Instructions (Signed)
[name] , Thank you for taking time to come for your Medicare Wellness Visit. I appreciate your ongoing commitment to your health goals. Please review the following plan we discussed and let me know if I can assist you in the future.   Screening recommendations/referrals: Colonoscopy: last done 08/17/2017; Cologuard due every 3 years Mammogram: last done 10/22/2014; due every 2 years Bone Density: never done Recommended yearly ophthalmology/optometry visit for glaucoma screening and checkup Recommended yearly dental visit for hygiene and checkup  Vaccinations: Influenza vaccine: due Fall 2022 (OVERDUE) Pneumococcal vaccine: 02/13/2018; need Prevnar 23 Tdap vaccine: last done 03/30/2007; due every 10 years (OVERDUE) Shingles vaccine: never done   Covid-19: 04/18/2019, 05/12/2019  Advanced directives: Advance directive discussed with you today. Even though you declined this today please call our office should you change your mind and we can give you the proper paperwork for you to fill out.  Conditions/risks identified: To start Silver Sneakers and be more physically active.  Next appointment: Please schedule your next Medicare Wellness Visit with your Nurse Health Advisor in 1 year by calling [phone].   Preventive Care 68 Years and Older, Female Preventive care refers to lifestyle choices and visits with your health care provider that can promote health and wellness. What does preventive care include? A yearly physical exam. This is also called an annual well check. Dental exams once or twice a year. Routine eye exams. Ask your health care provider how often you should have your eyes checked. Personal lifestyle choices, including: Daily care of your teeth and gums. Regular physical activity. Eating a healthy diet. Avoiding tobacco and drug use. Limiting alcohol use. Practicing safe sex. Taking low-dose aspirin every day. Taking vitamin and mineral supplements as recommended by  your health care provider. What happens during an annual well check? The services and screenings done by your health care provider during your annual well check will depend on your age, overall health, lifestyle risk factors, and family history of disease. Counseling  Your health care provider may ask you questions about your: Alcohol use. Tobacco use. Drug use. Emotional well-being. Home and relationship well-being. Sexual activity. Eating habits. History of falls. Memory and ability to understand (cognition). Work and work Astronomer. Reproductive health. Screening  You may have the following tests or measurements: Height, weight, and BMI. Blood pressure. Lipid and cholesterol levels. These may be checked every 5 years, or more frequently if you are over 68 years old. Skin check. Lung cancer screening. You may have this screening every year starting at age 67 if you have a 30-pack-year history of smoking and currently smoke or have quit within the past 15 years. Fecal occult blood test (FOBT) of the stool. You may have this test every year starting at age 64. Flexible sigmoidoscopy or colonoscopy. You may have a sigmoidoscopy every 5 years or a colonoscopy every 10 years starting at age 24. Hepatitis C blood test. Hepatitis B blood test. Sexually transmitted disease (STD) testing. Diabetes screening. This is done by checking your blood sugar (glucose) after you have not eaten for a while (fasting). You may have this done every 1-3 years. Bone density scan. This is done to screen for osteoporosis. You may have this done starting at age 56. Mammogram. This may be done every 1-2 years. Talk to your health care provider about how often you should have regular mammograms. Talk with your health care provider about your test results, treatment options, and if necessary, the need for more tests. Vaccines  Your health care provider may recommend certain vaccines, such as: Influenza  vaccine. This is recommended every year. Tetanus, diphtheria, and acellular pertussis (Tdap, Td) vaccine. You may need a Td booster every 10 years. Zoster vaccine. You may need this after age 51. Pneumococcal 13-valent conjugate (PCV13) vaccine. One dose is recommended after age 8. Pneumococcal polysaccharide (PPSV23) vaccine. One dose is recommended after age 64. Talk to your health care provider about which screenings and vaccines you need and how often you need them. This information is not intended to replace advice given to you by your health care provider. Make sure you discuss any questions you have with your health care provider. Document Released: 04/11/2015 Document Revised: 12/03/2015 Document Reviewed: 01/14/2015 Elsevier Interactive Patient Education  2017 [place] Prevention in the Home Falls can cause injuries. They can happen to people of all ages. There are many things you can do to make your home safe and to help prevent falls. What can I do on the outside of my home? Regularly fix the edges of walkways and driveways and fix any cracks. Remove anything that might make you trip as you walk through a door, such as a raised step or threshold. Trim any bushes or trees on the path to your home. Use bright outdoor lighting. Clear any walking paths of anything that might make someone trip, such as rocks or tools. Regularly check to see if handrails are loose or broken. Make sure that both sides of any steps have handrails. Any raised decks and porches should have guardrails on the edges. Have any leaves, snow, or ice cleared regularly. Use sand or salt on walking paths during winter. Clean up any spills in your garage right away. This includes oil or grease spills. What can I do in the bathroom? Use night lights. Install grab bars by the toilet and in the tub and shower. Do not use towel bars as grab bars. Use non-skid mats or decals in the tub or shower. If you  need to sit down in the shower, use a plastic, non-slip stool. Keep the floor dry. Clean up any water that spills on the floor as soon as it happens. Remove soap buildup in the tub or shower regularly. Attach bath mats securely with double-sided non-slip rug tape. Do not have throw rugs and other things on the floor that can make you trip. What can I do in the bedroom? Use night lights. Make sure that you have a light by your bed that is easy to reach. Do not use any sheets or blankets that are too big for your bed. They should not hang down onto the floor. Have a firm chair that has side arms. You can use this for support while you get dressed. Do not have throw rugs and other things on the floor that can make you trip. What can I do in the kitchen? Clean up any spills right away. Avoid walking on wet floors. Keep items that you use a lot in easy-to-reach places. If you need to reach something above you, use a strong step stool that has a grab bar. Keep electrical cords out of the way. Do not use floor polish or wax that makes floors slippery. If you must use wax, use non-skid floor wax. Do not have throw rugs and other things on the floor that can make you trip. What can I do with my stairs? Do not leave any items on the stairs. Make sure that there are  handrails on both sides of the stairs and use them. Fix handrails that are broken or loose. Make sure that handrails are as long as the stairways. Check any carpeting to make sure that it is firmly attached to the stairs. Fix any carpet that is loose or worn. Avoid having throw rugs at the top or bottom of the stairs. If you do have throw rugs, attach them to the floor with carpet tape. Make sure that you have a light switch at the top of the stairs and the bottom of the stairs. If you do not have them, ask someone to add them for you. What else can I do to help prevent falls? Wear shoes that: Do not have high heels. Have rubber  bottoms. Are comfortable and fit you well. Are closed at the toe. Do not wear sandals. If you use a stepladder: Make sure that it is fully opened. Do not climb a closed stepladder. Make sure that both sides of the stepladder are locked into place. Ask someone to hold it for you, if possible. Clearly mark and make sure that you can see: Any grab bars or handrails. First and last steps. Where the edge of each step is. Use tools that help you move around (mobility aids) if they are needed. These include: Canes. Walkers. Scooters. Crutches. Turn on the lights when you go into a dark area. Replace any light bulbs as soon as they burn out. Set up your furniture so you have a clear path. Avoid moving your furniture around. If any of your floors are uneven, fix them. If there are any pets around you, be aware of where they are. Review your medicines with your doctor. Some medicines can make you feel dizzy. This can increase your chance of falling. Ask your doctor what other things that you can do to help prevent falls. This information is not intended to replace advice given to you by your health care provider. Make sure you discuss any questions you have with your health care provider. Document Released: 01/09/2009 Document Revised: 08/21/2015 Document Reviewed: 04/19/2014 Elsevier Interactive Patient Education  2017 [employer].

## 2020-09-22 NOTE — Assessment & Plan Note (Addendum)
Acute Recent urine culture cipro 500 mg bid x 5 days and then stop

## 2020-12-12 ENCOUNTER — Other Ambulatory Visit: Payer: Self-pay | Admitting: Cardiovascular Disease

## 2020-12-12 NOTE — Telephone Encounter (Signed)
Rx has been sent to the pharmacy electronically. ° °

## 2020-12-17 ENCOUNTER — Other Ambulatory Visit: Payer: Self-pay | Admitting: Internal Medicine

## 2021-01-06 DIAGNOSIS — I10 Essential (primary) hypertension: Secondary | ICD-10-CM | POA: Diagnosis not present

## 2021-01-06 DIAGNOSIS — H1132 Conjunctival hemorrhage, left eye: Secondary | ICD-10-CM | POA: Diagnosis not present

## 2021-01-06 DIAGNOSIS — Z79899 Other long term (current) drug therapy: Secondary | ICD-10-CM | POA: Insufficient documentation

## 2021-01-06 DIAGNOSIS — J45909 Unspecified asthma, uncomplicated: Secondary | ICD-10-CM | POA: Diagnosis not present

## 2021-01-06 DIAGNOSIS — Z7982 Long term (current) use of aspirin: Secondary | ICD-10-CM | POA: Insufficient documentation

## 2021-01-06 DIAGNOSIS — E119 Type 2 diabetes mellitus without complications: Secondary | ICD-10-CM | POA: Diagnosis not present

## 2021-01-06 DIAGNOSIS — H5789 Other specified disorders of eye and adnexa: Secondary | ICD-10-CM | POA: Diagnosis present

## 2021-01-07 ENCOUNTER — Encounter (HOSPITAL_COMMUNITY): Payer: Self-pay

## 2021-01-07 ENCOUNTER — Emergency Department (HOSPITAL_COMMUNITY)
Admission: EM | Admit: 2021-01-07 | Discharge: 2021-01-07 | Disposition: A | Discharge status: Home / Self Care | Payer: Medicare (Managed Care) | Attending: Emergency Medicine | Admitting: Emergency Medicine

## 2021-01-07 ENCOUNTER — Other Ambulatory Visit: Payer: Self-pay

## 2021-01-07 DIAGNOSIS — H1132 Conjunctival hemorrhage, left eye: Secondary | ICD-10-CM

## 2021-01-07 NOTE — ED Provider Notes (Signed)
[place] DEPT Provider Note   CSN: [id number] Arrival date & time: 01/06/21  2256     History Chief Complaint  Patient presents with   eye bleeding    Cynthia Forbes is a 70 y.o. female.  Patient to ED for evaluation of bleeding in her left eye noticed this evening. No trauma to the eye. No pain or visual impairment. No forceful cough, sneezing. No vomiting. Patient not anticoagulated.   The history is provided by the patient. No language interpreter was used.      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     Family History  Problem Relation Age of Onset   Alzheimer's disease Mother    Cancer Mother    Kidney disease Father    Diabetes Father    Prostate cancer Father    Cancer Brother        spine   Liver cancer Maternal Grandfather     Social History   Tobacco Use   Smoking status: Never   Smokeless tobacco: Never  Vaping Use   Vaping Use: Never used  Substance Use Topics   Alcohol use: No   Drug use: No     Home Medications Prior to Admission medications   Medication Sig Start Date End Date Taking? Authorizing Provider  albuterol (PROAIR HFA) 108 (90 Base) MCG/ACT inhaler Inhale 2 puffs into the lungs every 6 (six) hours as needed for wheezing or shortness of breath. Must keep appt for future refills 02/13/18   Binnie Rail, MD  amLODipine (NORVASC) 10 MG tablet Take 0.5 tablets (5 mg total) by mouth 2 (two) times daily. NEED APPOINMENT 12/12/20   Skeet Latch, MD  aspirin 81 MG chewable tablet Chew 81 mg by mouth daily.    [provider]  Blood Pressure Monitor KIT 1 Package by Does not apply route daily. Check blood pressure as directed by pharmacist or physician. Diagnosis code I10 08/26/16   Skeet Latch, MD  carvedilol (COREG) 25 MG tablet TAKE 1 TABLET BY MOUTH TWICE DAILY WITH A MEAL.Marland Kitchen NEED APPOINTMENT 12/12/20   Skeet Latch, MD  hydrALAZINE (APRESOLINE) 50 MG tablet TAKE 1 TABLET BY MOUTH THREE TIMES DAILY 12/17/20   Binnie Rail, MD  meclizine (ANTIVERT) 12.5 MG tablet Take 1 tablet (12.5 mg total) by mouth 3 (three) times daily as needed for dizziness. 09/18/20 09/18/21  Biagio Borg, MD  rosuvastatin (CRESTOR) 20  MG tablet Take 1 tablet (20 mg total) by mouth daily. 11/21/19   Skeet Latch, MD  vitamin B-12 (CYANOCOBALAMIN) 1000 MCG tablet Take 1 tablet (1,000 mcg total) by mouth daily. 08/09/19   Binnie Rail, MD  vitamin C (ASCORBIC ACID) 250 MG tablet Take 1 tablet (250 mg total) by mouth daily. 08/09/19   Binnie Rail, MD    Allergies    Codeine, Ivp dye [iodinated diagnostic agents], Benazepril, Benicar [olmesartan], Clonidine derivatives, and Strawberry extract  Review of Systems   Review of Systems  Constitutional:  Negative for fever.  HENT:  Negative for congestion and sneezing.   Eyes:  Positive for redness. Negative for pain and visual disturbance.  Respiratory:  Negative for cough.   Gastrointestinal:  Negative for vomiting.    Physical Exam Updated Vital Signs BP (!) 180/116 (BP Location: Left Arm)   Pulse 86   Temp 98 F (36.7 C) (Oral)   Resp 16   SpO2 100%   Physical Exam Vitals and nursing note reviewed.  Constitutional:      General: She is not in acute distress.    Appearance: She is well-developed. She is not ill-appearing.  Eyes:     Extraocular Movements: Extraocular movements intact.     Pupils: Pupils are equal, round, and reactive to light.     Comments: Left medial subconjunctival hemorrhage.   Pulmonary:     Effort: Pulmonary effort is normal.  Musculoskeletal:        General: Normal range of motion.     Cervical back: Normal range of motion.  Skin:    General: Skin is warm and dry.  Neurological:     Mental Status: She is alert and oriented to person, place, and time.    ED Results / Procedures / Treatments   Labs (all labs ordered are listed, but only abnormal results are displayed) Labs Reviewed - No data to display  EKG None  Radiology No results found.  Procedures Procedures   Medications Ordered in ED Medications - No data to display  ED Course  I have reviewed the triage vital signs and the nursing notes.  Pertinent labs & imaging results that were available during my care of the patient were reviewed by me and considered in my medical decision making (see chart for details).    MDM Rules/Calculators/A&P                           Patient to ED with sudden onset bleeding into her left eye. No pain or trauma. No visual change.   Patient has a subconjunctival hemorrhage on exam. Patient reassured.   Final Clinical Impression(s) / ED Diagnoses Final diagnoses:  None   Left subconjunctival hemorrhage  Rx / DC Orders ED Discharge Orders     None        Dennie Bible 01/07/21 0208    Quintella Reichert, MD 01/07/21 [phone]

## 2021-01-07 NOTE — Discharge Instructions (Addendum)
Follow up with your doctor for recheck as needed.  

## 2021-01-07 NOTE — ED Triage Notes (Signed)
Pt states that she noticed bleeding to her left eye when she looked in the mirror last night. Pt states that she does not have pain and did not injure her eye. Pt reports having high blood pressure and states that she is on medication for it.

## 2021-02-26 ENCOUNTER — Encounter: Payer: Medicare (Managed Care) | Admitting: Internal Medicine

## 2021-05-03 ENCOUNTER — Encounter: Payer: Self-pay | Admitting: Internal Medicine

## 2021-05-03 NOTE — Patient Instructions (Addendum)
Blood work was ordered.     Medications changes include :   none  Your prescription(s) have been submitted to your pharmacy. Please take as directed and contact our office if you believe you are having problem(s) with the medication(s).    Please followup in 6 months   Health Maintenance, Female Adopting a healthy lifestyle and getting preventive care are important in promoting health and wellness. Ask your health care provider about: The right schedule for you to have regular tests and exams. Things you can do on your own to prevent diseases and keep yourself healthy. What should I know about diet, weight, and exercise? Eat a healthy diet  Eat a diet that includes plenty of vegetables, fruits, low-fat dairy products, and lean protein. Do not eat a lot of foods that are high in solid fats, added sugars, or sodium. Maintain a healthy weight Body mass index (BMI) is used to identify weight problems. It estimates body fat based on height and weight. Your health care provider can help determine your BMI and help you achieve or maintain a healthy weight. Get regular exercise Get regular exercise. This is one of the most important things you can do for your health. Most adults should: Exercise for at least 150 minutes each week. The exercise should increase your heart rate and make you sweat (moderate-intensity exercise). Do strengthening exercises at least twice a week. This is in addition to the moderate-intensity exercise. Spend less time sitting. Even light physical activity can be beneficial. Watch cholesterol and blood lipids Have your blood tested for lipids and cholesterol at 69 years of age, then have this test every 5 years. Have your cholesterol levels checked more often if: Your lipid or cholesterol levels are high. You are older than 69 years of age. You are at high risk for heart disease. What should I know about cancer screening? Depending on your health history and  family history, you may need to have cancer screening at various ages. This may include screening for: Breast cancer. Cervical cancer. Colorectal cancer. Skin cancer. Lung cancer. What should I know about heart disease, diabetes, and high blood pressure? Blood pressure and heart disease High blood pressure causes heart disease and increases the risk of stroke. This is more likely to develop in people who have high blood pressure readings or are overweight. Have your blood pressure checked: Every 3-5 years if you are 18-39 years of age. Every year if you are 40 years old or older. Diabetes Have regular diabetes screenings. This checks your fasting blood sugar level. Have the screening done: Once every three years after age 40 if you are at a normal weight and have a low risk for diabetes. More often and at a younger age if you are overweight or have a high risk for diabetes. What should I know about preventing infection? Hepatitis B If you have a higher risk for hepatitis B, you should be screened for this virus. Talk with your health care provider to find out if you are at risk for hepatitis B infection. Hepatitis C Testing is recommended for: Everyone born from 1945 through 1965. Anyone with known risk factors for hepatitis C. Sexually transmitted infections (STIs) Get screened for STIs, including gonorrhea and chlamydia, if: You are sexually active and are younger than 69 years of age. You are older than 69 years of age and your health care provider tells you that you are at risk for this type of infection. Your sexual activity has changed   since you were last screened, and you are at increased risk for chlamydia or gonorrhea. Ask your health care provider if you are at risk. Ask your health care provider about whether you are at high risk for HIV. Your health care provider may recommend a prescription medicine to help prevent HIV infection. If you choose to take medicine to prevent HIV,  you should first get tested for HIV. You should then be tested every 3 months for as long as you are taking the medicine. Pregnancy If you are about to stop having your period (premenopausal) and you may become pregnant, seek counseling before you get pregnant. Take 400 to 800 micrograms (mcg) of folic acid every day if you become pregnant. Ask for birth control (contraception) if you want to prevent pregnancy. Osteoporosis and menopause Osteoporosis is a disease in which the bones lose minerals and strength with aging. This can result in bone fractures. If you are 65 years old or older, or if you are at risk for osteoporosis and fractures, ask your health care provider if you should: Be screened for bone loss. Take a calcium or vitamin D supplement to lower your risk of fractures. Be given hormone replacement therapy (HRT) to treat symptoms of menopause. Follow these instructions at home: Alcohol use Do not drink alcohol if: Your health care provider tells you not to drink. You are pregnant, may be pregnant, or are planning to become pregnant. If you drink alcohol: Limit how much you have to: 0-1 drink a day. Know how much alcohol is in your drink. In the [place] one drink equals one 12 oz bottle of beer (355 mL), one 5 oz glass of wine (148 mL), or one 1 oz glass of hard liquor (44 mL). Lifestyle Do not use any products that contain nicotine or tobacco. These products include cigarettes, chewing tobacco, and vaping devices, such as e-cigarettes. If you need help quitting, ask your health care provider. Do not use street drugs. Do not share needles. Ask your health care provider for help if you need support or information about quitting drugs. General instructions Schedule regular health, dental, and eye exams. Stay current with your vaccines. Tell your health care provider if: You often feel depressed. You have ever been abused or do not feel safe at home. Summary Adopting a healthy  lifestyle and getting preventive care are important in promoting health and wellness. Follow your health care provider's instructions about healthy diet, exercising, and getting tested or screened for diseases. Follow your health care provider's instructions on monitoring your cholesterol and blood pressure. This information is not intended to replace advice given to you by your health care provider. Make sure you discuss any questions you have with your health care provider. Document Revised: 08/04/2020 Document Reviewed: 08/04/2020 Elsevier Patient Education  2022 [employer]  

## 2021-05-03 NOTE — Progress Notes (Signed)
Subjective:    Patient ID: Cynthia Forbes, female    DOB: [birth year], 71 y.o.   MRN: [id number]   This visit occurred during the SARS-CoV-2 public health emergency.  Safety protocols were in place, including screening questions prior to the visit, additional usage of staff PPE, and extensive cleaning of exam room while observing appropriate contact time as indicated for disinfecting solutions.    HPI She is here for a physical exam.   Past several months have been very stressful.  There has been multiple deaths in her family or bad accidents and some of them have been very unexpected and sudden.  She has been under increased stress because of that.  She has not been eating well or as much as usual.  She has lost some weight since she was here last.  She is not exercising regularly.  Medications and allergies reviewed with patient and updated if appropriate.  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 Must keep appt for future refills 18 g 8   amLODipine (NORVASC) 10 MG tablet Take 0.5 tablets (5 mg total) by mouth 2 (two) times daily. NEED APPOINMENT 30 tablet 0   aspirin 81 MG chewable tablet Chew 81 mg by mouth daily.     carvedilol (COREG) 25 MG tablet TAKE 1 TABLET BY MOUTH TWICE DAILY WITH A MEAL.Marland Kitchen NEED APPOINTMENT 60 tablet 0   hydrALAZINE (APRESOLINE) 50 MG  tablet TAKE 1 TABLET BY MOUTH THREE TIMES DAILY 270 tablet 0   meclizine (ANTIVERT) 12.5 MG tablet Take 1 tablet (12.5 mg total) by mouth 3 (three) times daily as needed for dizziness. 30 tablet 1   rosuvastatin (CRESTOR) 20 MG tablet Take 1 tablet (20 mg total) by mouth daily. 90 tablet 3   vitamin B-12 (CYANOCOBALAMIN) 1000 MCG tablet Take 1 tablet (1,000 mcg total) by mouth daily.     vitamin C (ASCORBIC ACID) 250 MG tablet Take 1 tablet (250 mg total) by mouth daily.     No current facility-administered medications on file prior to visit.    Past Medical History:  Diagnosis Date   Bronchitis    Diabetes mellitus without complication ([place])    Hyperlipemia    Hypertension    Kidney stones 2005 & 08/2011    passed spontaneously X 2    Past Surgical History:  Procedure Laterality Date   ABDOMINAL HYSTERECTOMY  10/05/11   Dr Nori Riis   OOPHORECTOMY  10/05/11   degenerative fibroid; Dr Nori Riis   TONSILLECTOMY     vaginal bleed  10/19/11   hematoma; Dr Matthew Saras    Social History   Socioeconomic History   Marital status: Divorced    Spouse name: Not on file   Number of children: Not on file   Years of education: Not on file   Highest education level: Not on file  Occupational History   Not on file  Tobacco Use   Smoking status: Never   Smokeless tobacco: Never  Vaping Use   Vaping Use: Never used  Substance and Sexual Activity   Alcohol use: No   Drug use: No   Sexual activity: Not Currently  Other Topics Concern   Not on file  Social History Narrative   Not on file   Social Determinants of Health   Financial Resource Strain: Low Risk    Difficulty of Paying Living Expenses: Not hard at all  Food Insecurity: No Food Insecurity   Worried About Charity fundraiser in the Last Year: Never true   [place] in the Last Year: Never true  Transportation Needs: No Transportation Needs   Lack of Transportation (Medical): No   Lack of Transportation (Non-Medical): No   Physical Activity: Sufficiently Active   Days of Exercise per Week: 5 days   Minutes of Exercise per Session: 30 min  Stress: No Stress Concern Present   Feeling of Stress : Not at all  Social Connections: Moderately Integrated   Frequency of Communication with Friends and Family: More than three times a week   Frequency of Social Gatherings with Friends and Family: More than three times a week   Attends Religious Services: More than 4 times per year   Active Member of [employer] or Organizations: Yes   Attends Music therapist: More than 4 times per year   Marital Status: Divorced    Family History  Problem Relation Age of Onset   Alzheimer's disease Mother    Cancer Mother    Kidney disease Father    Diabetes Father    Prostate cancer Father    Cancer Brother        spine   Liver cancer Maternal Grandfather     Review of Systems  Constitutional:  Negative for chills and fever.  Eyes:  Negative for visual disturbance.  Respiratory:  Positive for cough (at night sometimes - related to allergies) and wheezing (rare). Negative for shortness of breath.   Cardiovascular:  Positive for palpitations (occ - rapid heart beat). Negative for chest pain and leg swelling.  Gastrointestinal:  Negative for abdominal pain, blood in stool, constipation and diarrhea.       No gerd  Genitourinary:  Negative for dysuria.  Musculoskeletal:  Positive for arthralgias (shoulders). Negative for back pain.  Skin:  Negative for rash.  Neurological:  Negative for light-headedness and headaches.  Psychiatric/Behavioral:  Negative for dysphoric mood. The patient is not nervous/anxious.       Objective:   Vitals:   05/04/21 1306  BP: 128/82  Pulse: 82  Temp: 98.5 F (36.9 C)  SpO2: 99%   Filed Weights   05/04/21 1306  Weight: 212 lb (96.2 kg)   Body mass index is 32.23 kg/m.  BP Readings from Last 3 Encounters:  05/04/21 128/82  01/07/21 (!) 180/116  09/22/20 130/80    Wt  Readings from Last 3 Encounters:  05/04/21 212 lb (96.2 kg)  09/22/20 214 lb (97.1 kg)  09/22/20 214 lb 9.6 oz (97.3 kg)    Depression screen [facility] 2/9 09/22/2020 09/22/2020 09/18/2020 08/09/2019 08/09/2019  Decreased Interest 0 0 0 0 0  Down, Depressed, Hopeless 3 0 3 0 0  PHQ - 2 Score 3 0 3 0 0  Altered sleeping - - - - -  Tired, decreased energy - - - - -  Change in appetite - - - - -  Feeling bad or failure about yourself  - - - - -  Trouble concentrating - - - - -  Moving slowly or fidgety/restless - - - - -  Suicidal thoughts - - - - -  PHQ-9 Score - - - - -     No flowsheet data found.     Physical Exam Constitutional: She appears well-developed and well-nourished. No distress.  HENT:  Head: Normocephalic and atraumatic.  Right Ear: External ear normal. Normal ear canal and TM Left Ear: External ear normal.  Normal ear canal and TM Mouth/Throat: Oropharynx is clear and moist.  Eyes: Conjunctivae and EOM are normal.  Neck: Neck supple. No tracheal deviation present. No thyromegaly present.  No carotid bruit  Cardiovascular: Normal rate, regular rhythm and normal heart sounds.   No murmur heard.  No edema. Pulmonary/Chest: Effort normal and breath sounds normal. No respiratory distress. She has no wheezes. She has no rales.  Breast: deferred   Abdominal: Soft. She exhibits no distension. There is no tenderness.  Lymphadenopathy: She has no cervical adenopathy.  Skin: Skin is warm and dry. She is not diaphoretic.  Psychiatric: She has a normal mood and affect. Her behavior is normal.     Lab Results  Component Value Date   WBC 7.5 08/09/2019   HGB 12.9 08/09/2019   HCT 40.2 08/09/2019   PLT 317.0 08/09/2019   GLUCOSE 90 09/22/2020   CHOL 171 09/22/2020   TRIG 88.0 09/22/2020   HDL 51.40 09/22/2020   LDLDIRECT 176.7 01/11/2013   LDLCALC 102 (H) 09/22/2020   ALT 51 (H) 09/22/2020   AST 60 (H) 09/22/2020   NA 140 09/22/2020   K 3.9 09/22/2020   CL 102  09/22/2020   CREATININE 0.88 09/22/2020   BUN 9 09/22/2020   CO2 31 09/22/2020   TSH 1.39 08/12/2017   HGBA1C 7.9 (H) 09/22/2020   MICROALBUR 3.2 (H) 08/09/2019         Assessment & Plan:   Physical exam: Screening blood work  ordered Exercise  regular - bike, stepper Weight  working on weight loss - has lost some weight Substance abuse  none   Reviewed recommended immunizations.  Deferred Pneumovax 23 and flu for now   Health Maintenance  Topic Date Due   MAMMOGRAM  10/21/2016   DEXA SCAN  Never done   Pneumonia Vaccine 53+ Years old (2 - PPSV23 if available, else PCV20) 02/14/2019   Fecal DNA (Cologuard)  08/17/2020   HEMOGLOBIN A1C  03/24/2021   COVID-19 Vaccine (3 - Booster for Pfizer series) 05/20/2021 (Originally 07/07/2019)   INFLUENZA VACCINE  06/26/2021 (Originally 10/27/2020)   Zoster Vaccines- Shingrix (1 of 2) 08/01/2021 (Originally 09/12/2002)   TETANUS/TDAP  05/04/2022 (Originally 03/29/2017)   OPHTHALMOLOGY EXAM  06/11/2021   FOOT EXAM  09/22/2021   Hepatitis C Screening  Completed   HPV VACCINES  Aged Out          See Problem List for Assessment and Plan of chronic medical problems.

## 2021-05-04 ENCOUNTER — Other Ambulatory Visit: Payer: Self-pay

## 2021-05-04 ENCOUNTER — Ambulatory Visit (INDEPENDENT_AMBULATORY_CARE_PROVIDER_SITE_OTHER): Payer: Medicare (Managed Care) | Admitting: Internal Medicine

## 2021-05-04 VITALS — BP 128/82 | HR 82 | Temp 98.5°F | Ht 68.0 in | Wt 212.0 lb

## 2021-05-04 DIAGNOSIS — E538 Deficiency of other specified B group vitamins: Secondary | ICD-10-CM

## 2021-05-04 DIAGNOSIS — I10 Essential (primary) hypertension: Secondary | ICD-10-CM | POA: Diagnosis not present

## 2021-05-04 DIAGNOSIS — R42 Dizziness and giddiness: Secondary | ICD-10-CM | POA: Diagnosis not present

## 2021-05-04 DIAGNOSIS — E1165 Type 2 diabetes mellitus with hyperglycemia: Secondary | ICD-10-CM | POA: Diagnosis not present

## 2021-05-04 DIAGNOSIS — E782 Mixed hyperlipidemia: Secondary | ICD-10-CM | POA: Diagnosis not present

## 2021-05-04 DIAGNOSIS — J4520 Mild intermittent asthma, uncomplicated: Secondary | ICD-10-CM

## 2021-05-04 DIAGNOSIS — E559 Vitamin D deficiency, unspecified: Secondary | ICD-10-CM

## 2021-05-04 DIAGNOSIS — Z0000 Encounter for general adult medical examination without abnormal findings: Secondary | ICD-10-CM | POA: Diagnosis not present

## 2021-05-04 MED ORDER — ALBUTEROL SULFATE HFA 108 (90 BASE) MCG/ACT IN AERS: 2.0000 | INHALATION_SPRAY | Freq: Four times a day (QID) | RESPIRATORY_TRACT | 2 refills | Status: AC | PRN

## 2021-05-04 NOTE — Assessment & Plan Note (Signed)
Chronic ?Check B12 level ?

## 2021-05-04 NOTE — Assessment & Plan Note (Signed)
Chronic Intermittent wheeze and tightness-typically related to allergies Continue albuterol inhaler as needed

## 2021-05-04 NOTE — Assessment & Plan Note (Signed)
Chronic Taking vitamin D daily Check vitamin D level  

## 2021-05-04 NOTE — Assessment & Plan Note (Addendum)
Chronic Lab Results  Component Value Date   HGBA1C 7.9 (H) 09/22/2020   Not ideally controlled-not on medication She will get her blood work done, but will come back to do that-depending on A1c I will recommend medication Stressed diabetic diet, regular exercise and weight loss

## 2021-05-04 NOTE — Assessment & Plan Note (Signed)
Chronic Regular exercise and healthy diet encouraged Check lipid panel  Continue lovastatin 20 mg daily 

## 2021-05-04 NOTE — Assessment & Plan Note (Signed)
Chronic Intermittent - not often Related to changes in position Does head maneuvers as needed and it helps

## 2021-05-04 NOTE — Assessment & Plan Note (Signed)
Chronic Blood pressure well controlled CMP Continue amlodipine 5 mg twice daily, carvedilol 25 mg twice daily, hydralazine 50 mg 3 times daily

## 2021-05-14 ENCOUNTER — Other Ambulatory Visit: Payer: Self-pay

## 2021-05-14 ENCOUNTER — Telehealth: Payer: Self-pay

## 2021-05-14 DIAGNOSIS — Z1211 Encounter for screening for malignant neoplasm of colon: Secondary | ICD-10-CM

## 2021-05-14 NOTE — Telephone Encounter (Signed)
Order placed today in Epic.

## 2021-05-14 NOTE — Telephone Encounter (Signed)
-----   Message from Pincus Sanes, MD sent at 05/04/2021  1:39 PM EST ----- She is due for cologuard

## 2021-05-21 DIAGNOSIS — Z1211 Encounter for screening for malignant neoplasm of colon: Secondary | ICD-10-CM | POA: Diagnosis not present

## 2021-05-28 LAB — COLOGUARD: COLOGUARD: NEGATIVE

## 2021-05-29 ENCOUNTER — Encounter: Payer: Self-pay | Admitting: Internal Medicine

## 2021-07-02 ENCOUNTER — Other Ambulatory Visit: Payer: Self-pay | Admitting: Cardiovascular Disease

## 2021-07-02 NOTE — Telephone Encounter (Signed)
Rx(s) sent to pharmacy electronically.  

## 2021-07-07 ENCOUNTER — Other Ambulatory Visit: Payer: Self-pay | Admitting: Cardiovascular Disease

## 2021-07-09 ENCOUNTER — Other Ambulatory Visit: Payer: Self-pay | Admitting: Cardiovascular Disease

## 2021-07-09 NOTE — Telephone Encounter (Signed)
Pt need to call office to set up appointment in order to get refills, pt haven't been seen since 2021 ?

## 2021-08-10 ENCOUNTER — Telehealth: Payer: Self-pay

## 2021-08-10 NOTE — Telephone Encounter (Signed)
Called to schedule Medicare Annual Wellness Visit with [facility] nurse AWV. Pt states she will be out of town this summer and will be free in August but will have to call back to schedule when she has her calendar with her. When pt calls back please schedule visit.  ?

## 2021-08-31 ENCOUNTER — Telehealth: Payer: Self-pay | Admitting: Cardiovascular Disease

## 2021-08-31 ENCOUNTER — Other Ambulatory Visit: Payer: Self-pay | Admitting: Cardiovascular Disease

## 2021-08-31 MED ORDER — AMLODIPINE BESYLATE 10 MG PO TABS: 5.0000 mg | ORAL_TABLET | Freq: Two times a day (BID) | ORAL | 0 refills | Status: DC

## 2021-08-31 NOTE — Telephone Encounter (Signed)
*  STAT* If patient is at the pharmacy, call can be transferred to refill team.   1. Which medications need to be refilled? (please list name of each medication and dose if known) amLODipine (NORVASC) 10 MG tablet  2. Which pharmacy/location (including street and city if local pharmacy) is medication to be sent to? [id number] -  (SE), [place] - [address]  3. Do they need a 30 day or 90 day supply?  30  Pt says she has been out of this med for over a month now and has an appt scheduled on 06/09. She is requesting a call back to get enough to last her to appt at least, because she believes she has a headache due to now having this med.

## 2021-08-31 NOTE — Telephone Encounter (Signed)
*  STAT* If patient is at the pharmacy, call can be transferred to refill team.   1. Which medications need to be refilled? (please list name of each medication and dose if known) Amlodipine  2. Which pharmacy/location (including street and city if local pharmacy) is medication to be sent to? [facility] [address], [place]  3. Do they need a 30 day or 90 day supply? Enough until her appointment on Friday(09-04-21)- completely out,  please call today if possible

## 2021-08-31 NOTE — Telephone Encounter (Signed)
Rx request sent to pharmacy. Appt scheduled 09/04/21 with Laurann Montana, NP

## 2021-08-31 NOTE — Telephone Encounter (Signed)
15 day supply sent, ok to refill after patient is seen in office

## 2021-09-04 ENCOUNTER — Ambulatory Visit (INDEPENDENT_AMBULATORY_CARE_PROVIDER_SITE_OTHER): Payer: Medicare (Managed Care) | Admitting: Family

## 2021-09-04 ENCOUNTER — Encounter (HOSPITAL_BASED_OUTPATIENT_CLINIC_OR_DEPARTMENT_OTHER): Payer: Self-pay | Admitting: Family

## 2021-09-04 ENCOUNTER — Telehealth: Payer: Self-pay

## 2021-09-04 VITALS — BP 128/80 | HR 69 | Ht 68.0 in | Wt 209.0 lb

## 2021-09-04 DIAGNOSIS — R42 Dizziness and giddiness: Secondary | ICD-10-CM | POA: Diagnosis not present

## 2021-09-04 DIAGNOSIS — E538 Deficiency of other specified B group vitamins: Secondary | ICD-10-CM

## 2021-09-04 DIAGNOSIS — I10 Essential (primary) hypertension: Secondary | ICD-10-CM

## 2021-09-04 DIAGNOSIS — R739 Hyperglycemia, unspecified: Secondary | ICD-10-CM | POA: Diagnosis not present

## 2021-09-04 DIAGNOSIS — Z6831 Body mass index (BMI) 31.0-31.9, adult: Secondary | ICD-10-CM | POA: Diagnosis not present

## 2021-09-04 DIAGNOSIS — E559 Vitamin D deficiency, unspecified: Secondary | ICD-10-CM

## 2021-09-04 DIAGNOSIS — I452 Bifascicular block: Secondary | ICD-10-CM

## 2021-09-04 DIAGNOSIS — E782 Mixed hyperlipidemia: Secondary | ICD-10-CM

## 2021-09-04 MED ORDER — HYDRALAZINE HCL 50 MG PO TABS: 50.0000 mg | ORAL_TABLET | Freq: Three times a day (TID) | ORAL | 1 refills | Status: DC

## 2021-09-04 MED ORDER — AMLODIPINE BESYLATE 10 MG PO TABS: 5.0000 mg | ORAL_TABLET | Freq: Two times a day (BID) | ORAL | 1 refills | Status: DC

## 2021-09-04 MED ORDER — CARVEDILOL 25 MG PO TABS: 25.0000 mg | ORAL_TABLET | Freq: Two times a day (BID) | ORAL | 1 refills | Status: DC

## 2021-09-04 MED ORDER — ROSUVASTATIN CALCIUM 20 MG PO TABS: 20.0000 mg | ORAL_TABLET | Freq: Every day | ORAL | 3 refills | Status: DC

## 2021-09-04 NOTE — Patient Instructions (Signed)
Medication Instructions:  Continue your current medications.   *If you need a refill on your cardiac medications before your next appointment, please call your pharmacy*   Lab Work: Your physician recommends that you return for lab work today: TSH, vitamin D and B12, CBC, CMP, lipid panel  If you have labs (blood work) drawn today and your tests are completely normal, you will receive your results only by: MyChart Message (if you have MyChart) OR A paper copy in the mail If you have any lab test that is abnormal or we need to change your treatment, we will call you to review the results.   Testing/Procedures: Your EKG today is stable compared to previous.   Recommend reaching out to primary care about possible vestibular therapy.   Follow-Up: At [facility], you and your health needs are our priority.  As part of our continuing mission to provide you with exceptional heart care, we have created designated Provider Care Teams.  These Care Teams include your primary Cardiologist (physician) and Advanced Practice Providers (APPs -  Physician Assistants and Nurse Practitioners) who all work together to provide you with the care you need, when you need it.  We recommend signing up for the patient portal called "MyChart".  Sign up information is provided on this After Visit Summary.  MyChart is used to connect with patients for Virtual Visits (Telemedicine).  Patients are able to view lab/test results, encounter notes, upcoming appointments, etc.  Non-urgent messages can be sent to your provider as well.   To learn more about what you can do with MyChart, go to [email or web address].    Your next appointment:   3 month(s)  The format for your next appointment:   In Person  Provider:   Chilton Si, MD or Alver Sorrow, NP     Other Instructions  Heart Healthy Diet Recommendations: A low-salt diet is recommended. Meats should be grilled, baked, or boiled. Avoid fried  foods. Focus on lean protein sources like fish or chicken with vegetables and fruits. The American Heart Association is a Chief Technology Officer!  American Heart Association Diet and Lifeystyle Recommendations   Exercise recommendations: The American Heart Association recommends 150 minutes of moderate intensity exercise weekly. Try 30 minutes of moderate intensity exercise 4-5 times per week. This could include walking, jogging, or swimming.   Important Information About Sugar
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
## 2021-09-04 NOTE — Telephone Encounter (Signed)
Call to pt reference PREP referral Explained program to pt and confirmed interest Will be going out of town in July so will recall her late July/early August for class at the [facility]. Requests daytime.

## 2021-09-10 ENCOUNTER — Encounter: Payer: Self-pay | Admitting: Internal Medicine

## 2021-09-10 LAB — CBC
Hematocrit: 42.8 % (ref 34.0–46.6)
Hemoglobin: 13.6 g/dL (ref 11.1–15.9)
MCH: 25.8 pg — ABNORMAL LOW (ref 26.6–33.0)
MCHC: 31.8 g/dL (ref 31.5–35.7)
MCV: 81 fL (ref 79–97)
Platelets: 351 10*3/uL (ref 150–450)
RBC: 5.28 x10E6/uL (ref 3.77–5.28)
RDW: 13.7 % (ref 11.7–15.4)
WBC: 6.3 10*3/uL (ref 3.4–10.8)

## 2021-09-10 LAB — COMPREHENSIVE METABOLIC PANEL
ALT: 34 IU/L — ABNORMAL HIGH (ref 0–32)
AST: 27 IU/L (ref 0–40)
Albumin/Globulin Ratio: 1.3 (ref 1.2–2.2)
Albumin: 4.4 g/dL (ref 3.8–4.8)
Alkaline Phosphatase: 103 IU/L (ref 44–121)
BUN/Creatinine Ratio: 13 (ref 12–28)
BUN: 12 mg/dL (ref 8–27)
Bilirubin Total: 0.3 mg/dL (ref 0.0–1.2)
CO2: 24 mmol/L (ref 20–29)
Calcium: 9.7 mg/dL (ref 8.7–10.3)
Chloride: 105 mmol/L (ref 96–106)
Creatinine, Ser: 0.94 mg/dL (ref 0.57–1.00)
Globulin, Total: 3.5 g/dL (ref 1.5–4.5)
Glucose: 121 mg/dL — ABNORMAL HIGH (ref 70–99)
Potassium: 4.4 mmol/L (ref 3.5–5.2)
Sodium: 143 mmol/L (ref 134–144)
Total Protein: 7.9 g/dL (ref 6.0–8.5)
eGFR: 66 mL/min/{1.73_m2} (ref >59)

## 2021-09-10 LAB — LIPID PANEL
Chol/HDL Ratio: 3.9 ratio (ref 0.0–4.4)
Cholesterol, Total: 182 mg/dL (ref 100–199)
HDL: 47 mg/dL (ref >39)
LDL Chol Calc (NIH): 119 mg/dL — ABNORMAL HIGH (ref 0–99)
Triglycerides: 89 mg/dL (ref 0–149)
VLDL Cholesterol Cal: 16 mg/dL (ref 5–40)

## 2021-09-10 LAB — VITAMIN D 1,25 DIHYDROXY
Vitamin D 1, 25 (OH)2 Total: 84 pg/mL — ABNORMAL HIGH
Vitamin D2 1, 25 (OH)2: <10 pg/mL
Vitamin D3 1, 25 (OH)2: 78 pg/mL

## 2021-09-10 LAB — HEMOGLOBIN A1C
Est. average glucose Bld gHb Est-mCnc: 160 mg/dL
Hgb A1c MFr Bld: 7.2 % — ABNORMAL HIGH (ref 4.8–5.6)

## 2021-09-10 LAB — TSH: TSH: 1.43 u[IU]/mL (ref 0.450–4.500)

## 2021-09-10 LAB — B12 AND FOLATE PANEL
Folate: 11.1 ng/mL (ref >3.0)
Vitamin B-12: 490 pg/mL (ref 232–1245)

## 2021-09-11 ENCOUNTER — Telehealth (HOSPITAL_BASED_OUTPATIENT_CLINIC_OR_DEPARTMENT_OTHER): Payer: Self-pay

## 2021-09-11 NOTE — Telephone Encounter (Addendum)
Called results to patient and left results on VM (ok per DPR), instructions left to call office back if patient has any questions!       ----- Message from Alver Sorrow, NP sent at 09/10/2021  5:09 PM EDT ----- LDL (bad cholesterol) of 119. LDL goal less than 100. Important to take Rosuvastatin regularly. CBC with no evidence of anemia nor infection.  Normal kidneys, electrolytes, thyroid, B12. Liver enzymes improved from previous. One liver enzyme remains mildly elevated, not of concern. Recommend avoidance of alcohol, fried foods.   Vitamin D mildly elevated and A1c in diabetic range. CCing primary care for their input.

## 2021-09-23 ENCOUNTER — Ambulatory Visit (INDEPENDENT_AMBULATORY_CARE_PROVIDER_SITE_OTHER): Payer: Medicare (Managed Care)

## 2021-09-23 ENCOUNTER — Telehealth: Payer: Self-pay

## 2021-09-23 DIAGNOSIS — Z0000 Encounter for general adult medical examination without abnormal findings: Secondary | ICD-10-CM | POA: Diagnosis not present

## 2021-09-23 MED ORDER — AMLODIPINE BESYLATE 10 MG PO TABS: 5.0000 mg | ORAL_TABLET | Freq: Two times a day (BID) | ORAL | 1 refills | Status: DC

## 2021-09-23 NOTE — Telephone Encounter (Signed)
No problem-prescription sent to pharmacy.

## 2021-09-23 NOTE — Telephone Encounter (Signed)
Patient is requesting a refill on Amlodipine 10 mg.  Patient c/o headache, vertigo and stress due to three brothers in the hospital in 3 different states.  She will be traveling to all states to take care of family business.  Wanted to know should she take Dramamine for dizziness when she gets on the flight.  CB# [phone].

## 2021-09-23 NOTE — Progress Notes (Signed)
I connected with Cynthia Forbes today by telephone and verified that I am speaking with the correct person using two identifiers. Location patient: home Location provider: work Persons participating in the virtual visit: patient, provider.   I discussed the limitations, risks, security and privacy concerns of performing an evaluation and management service by telephone and the availability of in person appointments. I also discussed with the patient that there may be a patient responsible charge related to this service. The patient expressed understanding and verbally consented to this telephonic visit.    Interactive audio and video telecommunications were attempted between this provider and patient, however failed, due to patient having technical difficulties OR patient did not have access to video capability.  We continued and completed visit with audio only.  Some vital signs may be absent or patient reported.   Time Spent with patient on telephone encounter: 30 minutes  Subjective:   Cynthia Forbes is a 71 y.o. female who presents for Medicare Annual (Subsequent) preventive examination.  Review of Systems     Cardiac Risk Factors include: advanced age (>[id number], 58>65 women);diabetes mellitus;dyslipidemia;hypertension;family history of premature cardiovascular disease;obesity (BMI >30kg/m2)     Objective:    There were no vitals filed for this visit. There is no height or weight on file to calculate BMI.     09/23/2021    2:17 PM 01/07/2021    1:39 AM 09/22/2020    1:29 PM 08/09/2019    8:45 AM 01/22/2014    9:09 PM 10/19/2011    9:06 PM  Advanced Directives  Does Patient Have a Medical Advance Directive? No No No No No Patient does not have advance directive  Would patient like information on creating a medical advance directive? No - Patient declined No - Patient declined No - Patient declined Yes (ED - Information included in AVS) No - patient declined information   Pre-existing  out of facility DNR order (yellow form or pink MOST form)      No    Current Medications (verified) Outpatient Encounter Medications as of 09/23/2021  Medication Sig   albuterol (PROAIR HFA) 108 (90 Base) MCG/ACT inhaler Inhale 2 puffs into the lungs every 6 (six) hours as needed for wheezing or shortness of breath. Must keep appt for future refills   amLODipine (NORVASC) 10 MG tablet Take 0.5 tablets (5 mg total) by mouth 2 (two) times daily.   aspirin 81 MG chewable tablet Chew 81 mg by mouth daily.   carvedilol (COREG) 25 MG tablet Take 1 tablet (25 mg total) by mouth 2 (two) times daily with a meal.   hydrALAZINE (APRESOLINE) 50 MG tablet Take 1 tablet (50 mg total) by mouth 3 (three) times daily.   rosuvastatin (CRESTOR) 20 MG tablet Take 1 tablet (20 mg total) by mouth daily.   vitamin B-12 (CYANOCOBALAMIN) 1000 MCG tablet Take 1 tablet (1,000 mcg total) by mouth daily.   vitamin C (ASCORBIC ACID) 250 MG tablet Take 1 tablet (250 mg total) by mouth daily.   No facility-administered encounter medications on file as of 09/23/2021.    Allergies (verified) Codeine, Ivp dye [iodinated contrast media], Benazepril, Benicar [olmesartan], Clonidine derivatives, and Strawberry extract   History: Past Medical History:  Diagnosis Date   Bronchitis    Diabetes mellitus without complication (HCC)    Hyperlipemia    Hypertension    Kidney stones 2005 & 08/2011    passed spontaneously X 2   Past Surgical History:  Procedure Laterality Date  ABDOMINAL HYSTERECTOMY  10/05/11   Dr Jennette Kettle   OOPHORECTOMY  10/05/11   degenerative fibroid; Dr Jennette Kettle   TONSILLECTOMY     vaginal bleed  10/19/11   hematoma; Dr Marcelle Overlie   Family History  Problem Relation Age of Onset   Alzheimer's disease Mother    Cancer Mother    Kidney disease Father    Diabetes Father    Prostate cancer Father    Cancer Brother        spine   Liver cancer Maternal Grandfather    Social History   Socioeconomic History    Marital status: Divorced    Spouse name: Not on file   Number of children: Not on file   Years of education: Not on file   Highest education level: Not on file  Occupational History   Not on file  Tobacco Use   Smoking status: Never   Smokeless tobacco: Never  Vaping Use   Vaping Use: Never used  Substance and Sexual Activity   Alcohol use: No   Drug use: No   Sexual activity: Not Currently  Other Topics Concern   Not on file  Social History Narrative   Not on file   Social Determinants of Health   Financial Resource Strain: Low Risk  (09/23/2021)   Overall Financial Resource Strain (CARDIA)    Difficulty of Paying Living Expenses: Not hard at all  Food Insecurity: No Food Insecurity (09/23/2021)   Hunger Vital Sign    Worried About Running Out of Food in the Last Year: Never true    Ran Out of Food in the Last Year: Never true  Transportation Needs: No Transportation Needs (09/23/2021)   PRAPARE - Administrator, Civil Service (Medical): No    Lack of Transportation (Non-Medical): No  Physical Activity: Sufficiently Active (09/23/2021)   Exercise Vital Sign    Days of Exercise per Week: 5 days    Minutes of Exercise per Session: 30 min  Stress: Stress Concern Present (09/23/2021)   [employer] of Occupational Health - Occupational Stress Questionnaire    Feeling of Stress : Very much  Social Connections: Moderately Integrated (09/23/2021)   Social Connection and Isolation Panel [NHANES]    Frequency of Communication with Friends and Family: More than three times a week    Frequency of Social Gatherings with Friends and Family: More than three times a week    Attends Religious Services: More than 4 times per year    Active Member of [employer] or Organizations: Yes    Attends Engineer, structural: More than 4 times per year    Marital Status: Divorced    Tobacco Counseling Counseling given: Not Answered   Clinical Intake:  Pre-visit preparation  completed: Yes  Pain : No/denies pain     BMI - recorded: 31.79 Nutritional Status: BMI > 30  Obese Nutritional Risks: None Diabetes: Yes CBG done?: No Did pt. bring in CBG monitor from home?: No  How often do you need to have someone help you when you read instructions, pamphlets, or other written materials from your doctor or pharmacy?: 1 - Never What is the last grade level you completed in school?: College Graduate  Diabetic? yes  Interpreter Needed?: No  Information entered by :: Susie Cassette, LPN.   Activities of Daily Living    09/23/2021    2:20 PM  In your present state of health, do you have any difficulty performing the following activities:  Hearing? 0  Vision? 0  Difficulty concentrating or making decisions? 0  Walking or climbing stairs? 0  Dressing or bathing? 0  Doing errands, shopping? 0  Preparing Food and eating ? N  Using the Toilet? N  In the past six months, have you accidently leaked urine? N  Do you have problems with loss of bowel control? N  Managing your Medications? N  Managing your Finances? N  Housekeeping or managing your Housekeeping? N    Patient Care Team: Pincus Sanes, MD as PCP - General (Internal Medicine) Chilton Si, MD as PCP - Cardiology (Cardiology) [facility] as Consulting Physician (Optometry)  Indicate any recent Medical Services you may have received from other than [facility] providers in the past year (date may be approximate).     Assessment:   This is a routine wellness examination for Cynthia Forbes.  Hearing/Vision screen Hearing Screening - Comments:: Patient denied any hearing difficulty.   No hearing aids.  Vision Screening - Comments:: Patient does wear corrective lenses/contacts.  Eye exam done by: [facility]   Dietary issues and exercise activities discussed: Current Exercise Habits: Home exercise routine, Type of exercise: treadmill;walking;stretching;strength  training/weights;Other - see comments (cubii, exercise bike), Time (Minutes): 30, Frequency (Times/Week): 5, Weekly Exercise (Minutes/Week): 150, Intensity: Moderate, Exercise limited by: None identified   Goals Addressed             This Visit's Progress    Join a physical exercise class to be more active.        Depression Screen    09/23/2021    2:14 PM 09/22/2020    3:14 PM 09/22/2020    2:55 PM 09/18/2020    8:22 AM 08/09/2019    8:46 AM 08/09/2019    8:44 AM 08/09/2017    4:27 PM  PHQ 2/9 Scores  PHQ - 2 Score 0 3 0 3 0 0 0  PHQ- 9 Score       4    Fall Risk    09/23/2021    2:20 PM 09/22/2020    3:14 PM 09/22/2020    2:55 PM 09/18/2020    8:22 AM 08/09/2019    8:46 AM  Fall Risk   Falls in the past year? 0 0 0 0 0  Number falls in past yr: 0 0 0 0 0  Injury with Fall? 0 0 0  0  Risk for fall due to : No Fall Risks No Fall Risks   No Fall Risks  Follow up Falls evaluation completed Falls evaluation completed   Falls evaluation completed;Education provided;Falls prevention discussed    FALL RISK PREVENTION PERTAINING TO THE HOME:  Any stairs in or around the home? No  If so, are there any without handrails? No  Home free of loose throw rugs in walkways, pet beds, electrical cords, etc? Yes  Adequate lighting in your home to reduce risk of falls? Yes   ASSISTIVE DEVICES UTILIZED TO PREVENT FALLS:  Life alert? No  Use of a cane, walker or w/c? No  Grab bars in the bathroom? No  Shower chair or bench in shower? Yes  Elevated toilet seat or a handicapped toilet? No   TIMED UP AND GO:  Was the test performed? No .  Length of time to ambulate 10 feet: n/a sec.   Appearance of gait: Patient not evaluated for gait during this visit.  Cognitive Function:        09/23/2021    2:22 PM 08/09/2019  9:28 AM  6CIT Screen  What Year? 0 points 0 points  What month? 0 points 0 points  What time? 0 points 0 points  Count back from 20 0 points 0 points  Months in  reverse 0 points 0 points  Repeat phrase 0 points 0 points  Total Score 0 points 0 points    Immunizations Immunization History  Administered Date(s) Administered   Fluad Quad(high Dose 65+) 01/05/2019   Influenza-Unspecified 11/28/2015, 01/20/2017, 01/03/2018   PFIZER(Purple Top)SARS-COV-2 Vaccination 04/18/2019, 05/12/2019   Pneumococcal Conjugate-13 02/13/2018   Tdap 03/30/2007    TDAP status: Due, Education has been provided regarding the importance of this vaccine. Advised may receive this vaccine at local pharmacy or Health Dept. Aware to provide a copy of the vaccination record if obtained from local pharmacy or Health Dept. Verbalized acceptance and understanding.  Flu Vaccine status: Due, Education has been provided regarding the importance of this vaccine. Advised may receive this vaccine at local pharmacy or Health Dept. Aware to provide a copy of the vaccination record if obtained from local pharmacy or Health Dept. Verbalized acceptance and understanding.  Pneumococcal vaccine status: Due, Education has been provided regarding the importance of this vaccine. Advised may receive this vaccine at local pharmacy or Health Dept. Aware to provide a copy of the vaccination record if obtained from local pharmacy or Health Dept. Verbalized acceptance and understanding.  Covid-19 vaccine status: Completed vaccines  Qualifies for Shingles Vaccine? Yes   Zostavax completed No   Shingrix Completed?: No.    Education has been provided regarding the importance of this vaccine. Patient has been advised to call insurance company to determine out of pocket expense if they have not yet received this vaccine. Advised may also receive vaccine at local pharmacy or Health Dept. Verbalized acceptance and understanding.  Screening Tests Health Maintenance  Topic Date Due   Zoster Vaccines- Shingrix (1 of 2) Never done   MAMMOGRAM  10/21/2016   DEXA SCAN  Never done   Pneumonia Vaccine 65+ Years  old (2 - PPSV23 if available, else PCV20) 02/14/2019   COVID-19 Vaccine (3 - Pfizer series) 07/07/2019   OPHTHALMOLOGY EXAM  06/11/2021   FOOT EXAM  09/22/2021   TETANUS/TDAP  05/04/2022 (Originally 03/29/2017)   INFLUENZA VACCINE  10/27/2021   HEMOGLOBIN A1C  03/06/2022   Fecal DNA (Cologuard)  05/21/2024   Hepatitis C Screening  Completed   HPV VACCINES  Aged Out    Health Maintenance  Health Maintenance Due  Topic Date Due   Zoster Vaccines- Shingrix (1 of 2) Never done   MAMMOGRAM  10/21/2016   DEXA SCAN  Never done   Pneumonia Vaccine 25+ Years old (2 - PPSV23 if available, else PCV20) 02/14/2019   COVID-19 Vaccine (3 - Pfizer series) 07/07/2019   OPHTHALMOLOGY EXAM  06/11/2021   FOOT EXAM  09/22/2021    Colorectal cancer screening: Type of screening: Cologuard. Completed 05/28/2021. Repeat every 3 years  Mammogram status: Completed 10/22/2014. Repeat every year  Bone Density status: never done   Lung Cancer Screening: (Low Dose CT Chest recommended if Age 71-80 years, 30 pack-year currently smoking OR have quit w/in 15years.) does not qualify.   Lung Cancer Screening Referral: no  Additional Screening:  Hepatitis C Screening: does qualify; Completed 06/08/2016  Vision Screening: Recommended annual ophthalmology exams for early detection of glaucoma and other disorders of the eye. Is the patient up to date with their annual eye exam?  Yes  Who is the provider  or what is the name of the office in which the patient attends annual eye exams? [facility] If pt is not established with a provider, would they like to be referred to a provider to establish care? No .   Dental Screening: Recommended annual dental exams for proper oral hygiene  Community Resource Referral / Chronic Care Management: CRR required this visit?  No   CCM required this visit?  No      Plan:     I have personally reviewed and noted the following in the patient's chart:    Medical and social history Use of alcohol, tobacco or illicit drugs  Current medications and supplements including opioid prescriptions.  Functional ability and status Nutritional status Physical activity Advanced directives List of other physicians Hospitalizations, surgeries, and ER visits in previous 12 months Vitals Screenings to include cognitive, depression, and falls Referrals and appointments  In addition, I have reviewed and discussed with patient certain preventive protocols, quality metrics, and best practice recommendations. A written personalized care plan for preventive services as well as general preventive health recommendations were provided to patient.     Mickeal Needy, LPN   1/60/1093   Nurse Notes:  Patient is cogitatively intact. There were no vitals filed for this visit. There is no height or weight on file to calculate BMI. Patient stated that she has no issues with gait or balance; does not use any assistive devices.

## 2021-09-23 NOTE — Telephone Encounter (Signed)
Looks like her amlodipine was just sent in earlier this month by cardiology.  Okay to take nondrowsy Dramamine as needed for dizziness or flights.

## 2021-09-23 NOTE — Patient Instructions (Signed)
Cynthia Forbes , Thank you for taking time to come for your Medicare Wellness Visit. I appreciate your ongoing commitment to your health goals. Please review the following plan we discussed and let me know if I can assist you in the future.   Screening recommendations/referrals: Cologuard: 05/28/2021; due every 3 years Mammogram: 10/22/2014; due every year Bone Density: never done Recommended yearly ophthalmology/optometry visit for glaucoma screening and checkup Recommended yearly dental visit for hygiene and checkup  Vaccinations: Influenza vaccine: due 10/27/2021 Pneumococcal vaccine: due 2023 Tdap vaccine: due Shingles vaccine: due   Covid-19: 04/18/2019, 05/12/2019, 03/03/2020  Advanced directives: No  Conditions/risks identified: Yes  Next appointment: Please schedule your next Medicare Wellness Visit with your Nurse Health Advisor in 1 year by calling [phone].   Preventive Care 69 Years and Older, Female Preventive care refers to lifestyle choices and visits with your health care provider that can promote health and wellness. What does preventive care include? A yearly physical exam. This is also called an annual well check. Dental exams once or twice a year. Routine eye exams. Ask your health care provider how often you should have your eyes checked. Personal lifestyle choices, including: Daily care of your teeth and gums. Regular physical activity. Eating a healthy diet. Avoiding tobacco and drug use. Limiting alcohol use. Practicing safe sex. Taking low-dose aspirin every day. Taking vitamin and mineral supplements as recommended by your health care provider. What happens during an annual well check? The services and screenings done by your health care provider during your annual well check will depend on your age, overall health, lifestyle risk factors, and family history of disease. Counseling  Your health care provider may ask you questions about your: Alcohol  use. Tobacco use. Drug use. Emotional well-being. Home and relationship well-being. Sexual activity. Eating habits. History of falls. Memory and ability to understand (cognition). Work and work Astronomer. Reproductive health. Screening  You may have the following tests or measurements: Height, weight, and BMI. Blood pressure. Lipid and cholesterol levels. These may be checked every 5 years, or more frequently if you are over 28 years old. Skin check. Lung cancer screening. You may have this screening every year starting at age 71 if you have a 30-pack-year history of smoking and currently smoke or have quit within the past 15 years. Fecal occult blood test (FOBT) of the stool. You may have this test every year starting at age 42. Flexible sigmoidoscopy or colonoscopy. You may have a sigmoidoscopy every 5 years or a colonoscopy every 10 years starting at age 30. Hepatitis C blood test. Hepatitis B blood test. Sexually transmitted disease (STD) testing. Diabetes screening. This is done by checking your blood sugar (glucose) after you have not eaten for a while (fasting). You may have this done every 1-3 years. Bone density scan. This is done to screen for osteoporosis. You may have this done starting at age 85. Mammogram. This may be done every 1-2 years. Talk to your health care provider about how often you should have regular mammograms. Talk with your health care provider about your test results, treatment options, and if necessary, the need for more tests. Vaccines  Your health care provider may recommend certain vaccines, such as: Influenza vaccine. This is recommended every year. Tetanus, diphtheria, and acellular pertussis (Tdap, Td) vaccine. You may need a Td booster every 10 years. Zoster vaccine. You may need this after age 41. Pneumococcal 13-valent conjugate (PCV13) vaccine. One dose is recommended after age 64. Pneumococcal polysaccharide (PPSV23)  vaccine. One dose is  recommended after age 41. Talk to your health care provider about which screenings and vaccines you need and how often you need them. This information is not intended to replace advice given to you by your health care provider. Make sure you discuss any questions you have with your health care provider. Document Released: 04/11/2015 Document Revised: 12/03/2015 Document Reviewed: 01/14/2015 Elsevier Interactive Patient Education  2017 [employer] Prevention in the Home Falls can cause injuries. They can happen to people of all ages. There are many things you can do to make your home safe and to help prevent falls. What can I do on the outside of my home? Regularly fix the edges of walkways and driveways and fix any cracks. Remove anything that might make you trip as you walk through a door, such as a raised step or threshold. Trim any bushes or trees on the path to your home. Use bright outdoor lighting. Clear any walking paths of anything that might make someone trip, such as rocks or tools. Regularly check to see if handrails are loose or broken. Make sure that both sides of any steps have handrails. Any raised decks and porches should have guardrails on the edges. Have any leaves, snow, or ice cleared regularly. Use sand or salt on walking paths during winter. Clean up any spills in your garage right away. This includes oil or grease spills. What can I do in the bathroom? Use night lights. Install grab bars by the toilet and in the tub and shower. Do not use towel bars as grab bars. Use non-skid mats or decals in the tub or shower. If you need to sit down in the shower, use a plastic, non-slip stool. Keep the floor dry. Clean up any water that spills on the floor as soon as it happens. Remove soap buildup in the tub or shower regularly. Attach bath mats securely with double-sided non-slip rug tape. Do not have throw rugs and other things on the floor that can make you  trip. What can I do in the bedroom? Use night lights. Make sure that you have a light by your bed that is easy to reach. Do not use any sheets or blankets that are too big for your bed. They should not hang down onto the floor. Have a firm chair that has side arms. You can use this for support while you get dressed. Do not have throw rugs and other things on the floor that can make you trip. What can I do in the kitchen? Clean up any spills right away. Avoid walking on wet floors. Keep items that you use a lot in easy-to-reach places. If you need to reach something above you, use a strong step stool that has a grab bar. Keep electrical cords out of the way. Do not use floor polish or wax that makes floors slippery. If you must use wax, use non-skid floor wax. Do not have throw rugs and other things on the floor that can make you trip. What can I do with my stairs? Do not leave any items on the stairs. Make sure that there are handrails on both sides of the stairs and use them. Fix handrails that are broken or loose. Make sure that handrails are as long as the stairways. Check any carpeting to make sure that it is firmly attached to the stairs. Fix any carpet that is loose or worn. Avoid having throw rugs at the top or bottom  of the stairs. If you do have throw rugs, attach them to the floor with carpet tape. Make sure that you have a light switch at the top of the stairs and the bottom of the stairs. If you do not have them, ask someone to add them for you. What else can I do to help prevent falls? Wear shoes that: Do not have high heels. Have rubber bottoms. Are comfortable and fit you well. Are closed at the toe. Do not wear sandals. If you use a stepladder: Make sure that it is fully opened. Do not climb a closed stepladder. Make sure that both sides of the stepladder are locked into place. Ask someone to hold it for you, if possible. Clearly mark and make sure that you can  see: Any grab bars or handrails. First and last steps. Where the edge of each step is. Use tools that help you move around (mobility aids) if they are needed. These include: Canes. Walkers. Scooters. Crutches. Turn on the lights when you go into a dark area. Replace any light bulbs as soon as they burn out. Set up your furniture so you have a clear path. Avoid moving your furniture around. If any of your floors are uneven, fix them. If there are any pets around you, be aware of where they are. Review your medicines with your doctor. Some medicines can make you feel dizzy. This can increase your chance of falling. Ask your doctor what other things that you can do to help prevent falls. This information is not intended to replace advice given to you by your health care provider. Make sure you discuss any questions you have with your health care provider. Document Released: 01/09/2009 Document Revised: 08/21/2015 Document Reviewed: 04/19/2014 Elsevier Interactive Patient Education  2017 [employer].

## 2021-09-23 NOTE — Addendum Note (Signed)
Addended by: Pincus Sanes on: 09/23/2021 04:35 PM   Modules accepted: Orders

## 2021-09-24 NOTE — Telephone Encounter (Signed)
Patient has been advised of Dr. Lawerance Bach instructions.

## 2021-10-02 ENCOUNTER — Telehealth: Payer: Self-pay

## 2021-10-02 NOTE — Telephone Encounter (Signed)
Called reference next PREP class at [facility] 10/26/21 M/W 1p-230p.  She is interested in participating. Will call her closer to start of class to complete intake

## 2021-10-15 ENCOUNTER — Telehealth: Payer: Self-pay

## 2021-10-15 NOTE — Telephone Encounter (Signed)
LVMT pt requesting call back to schedule intake for PREP class at [place] starting on 10/26/21

## 2021-11-06 ENCOUNTER — Telehealth: Payer: Self-pay

## 2021-11-06 NOTE — Telephone Encounter (Signed)
LVMT pt requesting call back reference starting PREP on 11/17/21 at [facility]  

## 2021-12-09 ENCOUNTER — Encounter: Payer: Self-pay | Admitting: Internal Medicine

## 2021-12-09 NOTE — Progress Notes (Deleted)
    Subjective:    Patient ID: Cynthia Forbes, female    DOB: 27-Dec-1950, 71 y.o.   MRN: [id number]      HPI Nikka is here for No chief complaint on file.    Headache -     Medications and allergies reviewed with patient and updated if appropriate.  Current Outpatient Medications on File Prior to Visit  Medication Sig Dispense Refill   albuterol (PROAIR HFA) 108 (90 Base) MCG/ACT inhaler Inhale 2 puffs into the lungs every 6 (six) hours as needed for wheezing or shortness of breath. Must keep appt for future refills 6.7 g 2   amLODipine (NORVASC) 10 MG tablet Take 0.5 tablets (5 mg total) by mouth 2 (two) times daily. 90 tablet 1   aspirin 81 MG chewable tablet Chew 81 mg by mouth daily.     carvedilol (COREG) 25 MG tablet Take 1 tablet (25 mg total) by mouth 2 (two) times daily with a meal. 180 tablet 1   hydrALAZINE (APRESOLINE) 50 MG tablet Take 1 tablet (50 mg total) by mouth 3 (three) times daily. 270 tablet 1   rosuvastatin (CRESTOR) 20 MG tablet Take 1 tablet (20 mg total) by mouth daily. 90 tablet 3   vitamin B-12 (CYANOCOBALAMIN) 1000 MCG tablet Take 1 tablet (1,000 mcg total) by mouth daily.     vitamin C (ASCORBIC ACID) 250 MG tablet Take 1 tablet (250 mg total) by mouth daily.     No current facility-administered medications on file prior to visit.    Review of Systems     Objective:  There were no vitals filed for this visit. BP Readings from Last 3 Encounters:  09/04/21 128/80  05/04/21 128/82  01/07/21 (!) 180/116   Wt Readings from Last 3 Encounters:  09/04/21 209 lb (94.8 kg)  05/04/21 212 lb (96.2 kg)  09/22/20 214 lb (97.1 kg)   There is no height or weight on file to calculate BMI.    Physical Exam         Assessment & Plan:    See Problem List for Assessment and Plan of chronic medical problems.

## 2021-12-10 ENCOUNTER — Ambulatory Visit: Payer: Medicare (Managed Care) | Admitting: Internal Medicine

## 2021-12-14 ENCOUNTER — Other Ambulatory Visit: Payer: Self-pay

## 2021-12-14 DIAGNOSIS — Z1231 Encounter for screening mammogram for malignant neoplasm of breast: Secondary | ICD-10-CM

## 2022-03-03 DIAGNOSIS — Z01419 Encounter for gynecological examination (general) (routine) without abnormal findings: Secondary | ICD-10-CM | POA: Diagnosis not present

## 2022-03-03 DIAGNOSIS — Z6831 Body mass index (BMI) 31.0-31.9, adult: Secondary | ICD-10-CM | POA: Diagnosis not present

## 2022-03-03 DIAGNOSIS — Z1231 Encounter for screening mammogram for malignant neoplasm of breast: Secondary | ICD-10-CM | POA: Diagnosis not present

## 2022-08-17 DIAGNOSIS — E119 Type 2 diabetes mellitus without complications: Secondary | ICD-10-CM | POA: Diagnosis not present

## 2022-08-17 LAB — HM DIABETES EYE EXAM

## 2022-09-08 ENCOUNTER — Other Ambulatory Visit (HOSPITAL_BASED_OUTPATIENT_CLINIC_OR_DEPARTMENT_OTHER): Payer: Self-pay | Admitting: Family

## 2022-09-08 ENCOUNTER — Telehealth: Payer: Self-pay | Admitting: Internal Medicine

## 2022-09-08 DIAGNOSIS — I10 Essential (primary) hypertension: Secondary | ICD-10-CM

## 2022-09-08 NOTE — Telephone Encounter (Signed)
Pt is over due for appt last saw MD 05/04/21. Need appt (physical) for refills .Cynthia Forbes

## 2022-09-08 NOTE — Telephone Encounter (Signed)
Prescription Request  09/08/2022  LOV: Visit date not found  What is the name of the medication or equipment? carvedilol (COREG) 25 MG tablet  amLODipine (NORVASC) 10 MG tablet  Have you contacted your pharmacy to request a refill? No   Which pharmacy would you like this sent to?  [facility] [address] ([address]  - [address] [place]) [place] [address] Phone: [phone] Fax: [phone]    Patient notified that their request is being sent to the clinical staff for review and that they should receive a response within 2 business days.   Please advise at Mobile [phone] (mobile)

## 2022-09-08 NOTE — Telephone Encounter (Signed)
Rx request sent to pharmacy.  

## 2022-09-22 NOTE — Patient Instructions (Addendum)
      Blood work was ordered.   The lab is on the first floor.    Medications changes include :   decrease hydralazine to twice a day     Return in about 6 months (around 03/25/2023) for Physical Exam.

## 2022-09-22 NOTE — Progress Notes (Unsigned)
Subjective:    Patient ID: Cynthia Forbes, female    DOB: 1951/01/26, 72 y.o.   MRN: [id number]     HPI Sieanna is here for follow up of her chronic medical problems.  Taking crestor every other day.   Taking hydralazine twice a day. Has been out of other BP meds x 3 weeks.  BP was well controlled when she was on her meds.   She is exercising.   Medications and allergies reviewed with patient and updated if appropriate.  Current Outpatient Medications on File Prior to Visit  Medication Sig Dispense Refill   albuterol (PROAIR HFA) 108 (90 Base) MCG/ACT inhaler Inhale 2 puffs into the lungs every 6 (six) hours as needed for wheezing or shortness of breath. Must keep appt for future refills 6.7 g 2   amLODipine (NORVASC) 10 MG tablet Take 0.5 tablets (5 mg total) by mouth 2 (two) times daily. 90 tablet 1   aspirin 81 MG chewable tablet Chew 81 mg by mouth daily.     carvedilol (COREG) 25 MG tablet TAKE 1 TABLET BY MOUTH TWICE DAILY WITH A MEAL 180 tablet 1   hydrALAZINE (APRESOLINE) 50 MG tablet Take 1 tablet (50 mg total) by mouth 3 (three) times daily. 270 tablet 1   rosuvastatin (CRESTOR) 20 MG tablet Take 1 tablet (20 mg total) by mouth daily. 90 tablet 3   vitamin B-12 (CYANOCOBALAMIN) 1000 MCG tablet Take 1 tablet (1,000 mcg total) by mouth daily.     vitamin C (ASCORBIC ACID) 250 MG tablet Take 1 tablet (250 mg total) by mouth daily.     No current facility-administered medications on file prior to visit.     Review of Systems  Constitutional:  Negative for fever.  Respiratory:  Positive for cough (environmental). Negative for shortness of breath and wheezing.   Cardiovascular:  Positive for palpitations (occ). Negative for chest pain and leg swelling.  Neurological:  Positive for dizziness (occ - when she does not have medicine) and headaches (occ).       Objective:   Vitals:   09/23/22 0749  BP: (!) 154/102  Pulse: 77  Temp: 98.6 F (37 C)  SpO2: 96%   BP  Readings from Last 3 Encounters:  09/23/22 (!) 154/102  09/04/21 128/80  05/04/21 128/82   Wt Readings from Last 3 Encounters:  09/23/22 200 lb (90.7 kg)  09/04/21 209 lb (94.8 kg)  05/04/21 212 lb (96.2 kg)   Body mass index is 30.41 kg/m.    Physical Exam Constitutional:      General: She is not in acute distress.    Appearance: Normal appearance.  HENT:     Head: Normocephalic and atraumatic.  Eyes:     Conjunctiva/sclera: Conjunctivae normal.  Cardiovascular:     Rate and Rhythm: Normal rate and regular rhythm.     Heart sounds: Normal heart sounds.  Pulmonary:     Effort: Pulmonary effort is normal. No respiratory distress.     Breath sounds: Normal breath sounds. No wheezing.  Musculoskeletal:     Cervical back: Neck supple.     Right lower leg: No edema.     Left lower leg: No edema.  Lymphadenopathy:     Cervical: No cervical adenopathy.  Skin:    General: Skin is warm and dry.     Findings: No rash.  Neurological:     Mental Status: She is alert. Mental status is at baseline.  Psychiatric:  Mood and Affect: Mood normal.        Behavior: Behavior normal.        Lab Results  Component Value Date   WBC 6.3 09/04/2021   HGB 13.6 09/04/2021   HCT 42.8 09/04/2021   PLT 351 09/04/2021   GLUCOSE 121 (H) 09/04/2021   CHOL 182 09/04/2021   TRIG 89 09/04/2021   HDL 47 09/04/2021   LDLDIRECT 176.7 01/11/2013   LDLCALC 119 (H) 09/04/2021   ALT 34 (H) 09/04/2021   AST 27 09/04/2021   NA 143 09/04/2021   K 4.4 09/04/2021   CL 105 09/04/2021   CREATININE 0.94 09/04/2021   BUN 12 09/04/2021   CO2 24 09/04/2021   TSH 1.430 09/04/2021   HGBA1C 7.2 (H) 09/04/2021   MICROALBUR 3.2 (H) 08/09/2019     Assessment & Plan:    See Problem List for Assessment and Plan of chronic medical problems.

## 2022-09-23 ENCOUNTER — Encounter: Payer: Self-pay | Admitting: Internal Medicine

## 2022-09-23 ENCOUNTER — Ambulatory Visit (INDEPENDENT_AMBULATORY_CARE_PROVIDER_SITE_OTHER): Payer: Medicare (Managed Care) | Admitting: Internal Medicine

## 2022-09-23 ENCOUNTER — Encounter: Payer: Self-pay | Admitting: Radiology

## 2022-09-23 VITALS — BP 154/102 | HR 77 | Temp 98.6°F | Ht 68.0 in | Wt 200.0 lb

## 2022-09-23 DIAGNOSIS — E1165 Type 2 diabetes mellitus with hyperglycemia: Secondary | ICD-10-CM | POA: Diagnosis not present

## 2022-09-23 DIAGNOSIS — E782 Mixed hyperlipidemia: Secondary | ICD-10-CM | POA: Diagnosis not present

## 2022-09-23 DIAGNOSIS — E538 Deficiency of other specified B group vitamins: Secondary | ICD-10-CM | POA: Diagnosis not present

## 2022-09-23 DIAGNOSIS — I10 Essential (primary) hypertension: Secondary | ICD-10-CM

## 2022-09-23 DIAGNOSIS — E559 Vitamin D deficiency, unspecified: Secondary | ICD-10-CM | POA: Diagnosis not present

## 2022-09-23 LAB — LIPID PANEL
Cholesterol: 219 mg/dL — ABNORMAL HIGH (ref 0–200)
HDL: 44.3 mg/dL (ref >39.00)
LDL Cholesterol: 154 mg/dL — ABNORMAL HIGH (ref 0–99)
NonHDL: 175.01
Total CHOL/HDL Ratio: 5
Triglycerides: 104 mg/dL (ref 0.0–149.0)
VLDL: 20.8 mg/dL (ref 0.0–40.0)

## 2022-09-23 LAB — COMPREHENSIVE METABOLIC PANEL
ALT: 30 U/L (ref 0–35)
AST: 34 U/L (ref 0–37)
Albumin: 4.2 g/dL (ref 3.5–5.2)
Alkaline Phosphatase: 78 U/L (ref 39–117)
BUN: 9 mg/dL (ref 6–23)
CO2: 29 mEq/L (ref 19–32)
Calcium: 9.8 mg/dL (ref 8.4–10.5)
Chloride: 106 mEq/L (ref 96–112)
Creatinine, Ser: 0.9 mg/dL (ref 0.40–1.20)
GFR: 64.99 mL/min (ref >60.00)
Glucose, Bld: 110 mg/dL — ABNORMAL HIGH (ref 70–99)
Potassium: 3.9 mEq/L (ref 3.5–5.1)
Sodium: 142 mEq/L (ref 135–145)
Total Bilirubin: 0.6 mg/dL (ref 0.2–1.2)
Total Protein: 8.4 g/dL — ABNORMAL HIGH (ref 6.0–8.3)

## 2022-09-23 LAB — CBC WITH DIFFERENTIAL/PLATELET
Basophils Absolute: 0 10*3/uL (ref 0.0–0.1)
Basophils Relative: 0.4 % (ref 0.0–3.0)
Eosinophils Absolute: 0.1 10*3/uL (ref 0.0–0.7)
Eosinophils Relative: 1.2 % (ref 0.0–5.0)
HCT: 40.6 % (ref 36.0–46.0)
Hemoglobin: 12.9 g/dL (ref 12.0–15.0)
Lymphocytes Relative: 31.1 % (ref 12.0–46.0)
Lymphs Abs: 2.1 10*3/uL (ref 0.7–4.0)
MCHC: 31.8 g/dL (ref 30.0–36.0)
MCV: 82.8 fl (ref 78.0–100.0)
Monocytes Absolute: 0.5 10*3/uL (ref 0.1–1.0)
Monocytes Relative: 7.5 % (ref 3.0–12.0)
Neutro Abs: 4.1 10*3/uL (ref 1.4–7.7)
Neutrophils Relative %: 59.8 % (ref 43.0–77.0)
Platelets: 331 10*3/uL (ref 150.0–400.0)
RBC: 4.9 Mil/uL (ref 3.87–5.11)
RDW: 15.1 % (ref 11.5–15.5)
WBC: 6.9 10*3/uL (ref 4.0–10.5)

## 2022-09-23 LAB — VITAMIN B12: Vitamin B-12: 296 pg/mL (ref 211–911)

## 2022-09-23 LAB — VITAMIN D 25 HYDROXY (VIT D DEFICIENCY, FRACTURES): VITD: 12.62 ng/mL — ABNORMAL LOW (ref 30.00–100.00)

## 2022-09-23 LAB — HEMOGLOBIN A1C: Hgb A1c MFr Bld: 7 % — ABNORMAL HIGH (ref 4.6–6.5)

## 2022-09-23 MED ORDER — DEXCOM G7 RECEIVER DEVI
0 refills | Status: DC
Start: 2022-09-23 — End: 2023-11-02

## 2022-09-23 MED ORDER — AMLODIPINE BESYLATE 10 MG PO TABS: 5.0000 mg | ORAL_TABLET | Freq: Two times a day (BID) | ORAL | 1 refills | Status: DC

## 2022-09-23 MED ORDER — HYDRALAZINE HCL 50 MG PO TABS: 50.0000 mg | ORAL_TABLET | Freq: Two times a day (BID) | ORAL | 1 refills | Status: DC

## 2022-09-23 MED ORDER — DEXCOM G7 SENSOR MISC
3 refills | Status: DC
Start: 2022-09-23 — End: 2023-11-02

## 2022-09-23 MED ORDER — CARVEDILOL 25 MG PO TABS: 25.0000 mg | ORAL_TABLET | Freq: Two times a day (BID) | ORAL | 1 refills | Status: DC

## 2022-09-23 NOTE — Assessment & Plan Note (Signed)
Chronic Check vitamin D level 

## 2022-09-23 NOTE — Assessment & Plan Note (Signed)
Chronic Lab Results  Component Value Date   HGBA1C 7.2 (H) 09/04/2021   Not ideally controlled-not on medication Stressed importance of sugar control and routine follow up Stressed diabetic diet, regular exercise and weight loss

## 2022-09-23 NOTE — Assessment & Plan Note (Signed)
Chronic ?Check B12 level ?

## 2022-09-23 NOTE — Assessment & Plan Note (Addendum)
Chronic Has been out of medications x 3 weeks Blood pressure not well controlled here today Was controlled at home with her meds - was only taking hydralazine bid - causes increased urination CMP, cbc restart amlodipine 5 mg twice daily, carvedilol 25 mg twice daily, hydralazine 50 mg 2 times daily ( decrease dose to what she is taking)

## 2022-09-23 NOTE — Assessment & Plan Note (Addendum)
Chronic Not taking medication daily Regular exercise and healthy diet encouraged Check lipid panel  Continue crestor 20 mg daily

## 2022-09-24 LAB — MICROALBUMIN / CREATININE URINE RATIO
Creatinine,U: 233.2 mg/dL
Microalb Creat Ratio: 0.7 mg/g (ref 0.0–30.0)
Microalb, Ur: 1.6 mg/dL (ref 0.0–1.9)

## 2022-09-25 MED ORDER — VITAMIN D (ERGOCALCIFEROL) 1.25 MG (50000 UNIT) PO CAPS: 50000.0000 [IU] | ORAL_CAPSULE | ORAL | 0 refills | Status: AC

## 2022-09-25 NOTE — Addendum Note (Signed)
Addended by: Pincus Sanes on: 09/25/2022 12:03 PM   Modules accepted: Orders

## 2022-11-04 ENCOUNTER — Ambulatory Visit (INDEPENDENT_AMBULATORY_CARE_PROVIDER_SITE_OTHER): Payer: Medicare (Managed Care)

## 2022-11-04 DIAGNOSIS — Z0000 Encounter for general adult medical examination without abnormal findings: Secondary | ICD-10-CM | POA: Diagnosis not present

## 2022-11-04 NOTE — Patient Instructions (Signed)

## 2022-11-04 NOTE — Progress Notes (Signed)
Subjective:   Cynthia Forbes is a 72 y.o. female who presents for Medicare Annual (Subsequent) preventive examination.  Visit Complete: Virtual  I connected with  Cynthia Forbes on 11/04/22 by a audio enabled telemedicine application and verified that I am speaking with the correct person using two identifiers.  Patient Location: Home  Provider Location: Home Office  I discussed the limitations of evaluation and management by telemedicine. The patient expressed understanding and agreed to proceed.  Patient Medicare AWV questionnaire was completed by the patient on 11/04/2022; I have confirmed that all information answered by patient is correct and no changes since this date.  Review of Systems     Cardiac Risk Factors include: advanced age (>36men, >71 women);diabetes mellitus;dyslipidemia;hypertension     Objective:    Today's Vitals   There is no height or weight on file to calculate BMI.     11/04/2022    2:27 PM 09/23/2021    2:17 PM 01/07/2021    1:39 AM 09/22/2020    1:29 PM 08/09/2019    8:45 AM 01/22/2014    9:09 PM 10/19/2011    9:06 PM  Advanced Directives  Does Patient Have a Medical Advance Directive? Yes No No No No No Patient does not have advance directive  Type of Public librarian Power of [place];Living will        Copy of Healthcare Power of Attorney in Chart? No - copy requested        Would patient like information on creating a medical advance directive?  No - Patient declined No - Patient declined No - Patient declined Yes (ED - Information included in AVS) No - patient declined information   Pre-existing out of facility DNR order (yellow form or pink MOST form)       No    Current Medications (verified) Outpatient Encounter Medications as of 11/04/2022  Medication Sig   albuterol (PROAIR HFA) 108 (90 Base) MCG/ACT inhaler Inhale 2 puffs into the lungs every 6 (six) hours as needed for wheezing or shortness of breath. Must keep appt for  future refills   amLODipine (NORVASC) 10 MG tablet Take 0.5 tablets (5 mg total) by mouth 2 (two) times daily.   aspirin 81 MG chewable tablet Chew 81 mg by mouth daily.   carvedilol (COREG) 25 MG tablet Take 1 tablet (25 mg total) by mouth 2 (two) times daily with a meal.   Continuous Glucose Receiver (DEXCOM G7 RECEIVER) DEVI UAD to check sugars  E11.9   Continuous Glucose Sensor (DEXCOM G7 SENSOR) MISC UAD to check sugars  E11.9   hydrALAZINE (APRESOLINE) 50 MG tablet Take 1 tablet (50 mg total) by mouth 2 (two) times daily.   rosuvastatin (CRESTOR) 20 MG tablet Take 1 tablet (20 mg total) by mouth daily.   vitamin B-12 (CYANOCOBALAMIN) 1000 MCG tablet Take 1 tablet (1,000 mcg total) by mouth daily.   vitamin C (ASCORBIC ACID) 250 MG tablet Take 1 tablet (250 mg total) by mouth daily.   Vitamin D, Ergocalciferol, (DRISDOL) 1.25 MG (50000 UNIT) CAPS capsule Take 1 capsule (50,000 Units total) by mouth every 7 (seven) days.   No facility-administered encounter medications on file as of 11/04/2022.    Allergies (verified) Codeine, Ivp dye [iodinated contrast media], Benazepril, Benicar [olmesartan], Clonidine derivatives, and Strawberry extract   History: Past Medical History:  Diagnosis Date   Bronchitis    Diabetes mellitus without complication (HCC)    Hyperlipemia    Hypertension  Kidney stones 2005 & 08/2011    passed spontaneously X 2   Past Surgical History:  Procedure Laterality Date   ABDOMINAL HYSTERECTOMY  10/05/11   Dr Jennette Kettle   OOPHORECTOMY  10/05/11   degenerative fibroid; Dr Jennette Kettle   TONSILLECTOMY     vaginal bleed  10/19/11   hematoma; Dr Marcelle Overlie   Family History  Problem Relation Age of Onset   Alzheimer's disease Mother    Cancer Mother    Kidney disease Father    Diabetes Father    Prostate cancer Father    Cancer Brother        spine   Liver cancer Maternal Grandfather    Social History   Socioeconomic History   Marital status: Divorced    Spouse name:  Not on file   Number of children: Not on file   Years of education: Not on file   Highest education level: Not on file  Occupational History   Not on file  Tobacco Use   Smoking status: Never   Smokeless tobacco: Never  Vaping Use   Vaping status: Never Used  Substance and Sexual Activity   Alcohol use: No   Drug use: No   Sexual activity: Not Currently  Other Topics Concern   Not on file  Social History Narrative   Not on file   Social Determinants of Health   Financial Resource Strain: Low Risk  (11/04/2022)   Overall Financial Resource Strain (CARDIA)    Difficulty of Paying Living Expenses: Not hard at all  Food Insecurity: No Food Insecurity (11/04/2022)   Hunger Vital Sign    Worried About Running Out of Food in the Last Year: Never true    Ran Out of Food in the Last Year: Never true  Transportation Needs: No Transportation Needs (11/04/2022)   PRAPARE - Administrator, Civil Service (Medical): No    Lack of Transportation (Non-Medical): No  Physical Activity: Insufficiently Active (11/04/2022)   Exercise Vital Sign    Days of Exercise per Week: 2 days    Minutes of Exercise per Session: 30 min  Stress: No Stress Concern Present (11/04/2022)   [employer] of Occupational Health - Occupational Stress Questionnaire    Feeling of Stress : Not at all  Social Connections: Moderately Integrated (11/04/2022)   Social Connection and Isolation Panel [NHANES]    Frequency of Communication with Friends and Family: More than three times a week    Frequency of Social Gatherings with Friends and Family: More than three times a week    Attends Religious Services: More than 4 times per year    Active Member of [employer] or Organizations: Yes    Attends Engineer, structural: More than 4 times per year    Marital Status: Divorced    Tobacco Counseling Counseling given: Not Answered   Clinical Intake:  Pre-visit preparation completed: Yes  Pain : No/denies  pain     Diabetes: Yes CBG done?: No Did pt. bring in CBG monitor from home?: No  How often do you need to have someone help you when you read instructions, pamphlets, or other written materials from your doctor or pharmacy?: 1 - Never What is the last grade level you completed in school?: bachelors degree  Interpreter Needed?: No      Activities of Daily Living    11/04/2022    2:18 PM  In your present state of health, do you have any difficulty performing the following activities:  Hearing? 0  Vision? 0  Difficulty concentrating or making decisions? 0  Walking or climbing stairs? 0  Dressing or bathing? 0  Doing errands, shopping? 0  Preparing Food and eating ? N  Using the Toilet? N  In the past six months, have you accidently leaked urine? Y  Do you have problems with loss of bowel control? N  Managing your Medications? N  Managing your Finances? N  Housekeeping or managing your Housekeeping? N    Patient Care Team: Pincus Sanes, MD as PCP - General (Internal Medicine) Chilton Si, MD as PCP - Cardiology (Cardiology) [facility] as Consulting Physician (Optometry)  Indicate any recent Medical Services you may have received from other than [facility] providers in the past year (date may be approximate).     Assessment:   This is a routine wellness examination for Hillari.  Hearing/Vision screen No results found.  Dietary issues and exercise activities discussed:     Goals Addressed             This Visit's Progress    be happy        Depression Screen    11/04/2022    2:25 PM 09/23/2022    7:56 AM 09/23/2022    7:55 AM 09/23/2021    2:14 PM 09/22/2020    3:14 PM 09/22/2020    2:55 PM 09/18/2020    8:22 AM  PHQ 2/9 Scores  PHQ - 2 Score 0 0 0 0 3 0 3  PHQ- 9 Score 0          Fall Risk    11/04/2022    2:27 PM 09/23/2022    7:55 AM 09/23/2021    2:20 PM 09/22/2020    3:14 PM 09/22/2020    2:55 PM  Fall Risk   Falls in the past  year? 1 0 0 0 0  Number falls in past yr: 0 0 0 0 0  Injury with Fall? 1 0 0 0 0  Risk for fall due to : History of fall(s) No Fall Risks No Fall Risks No Fall Risks   Follow up Falls evaluation completed Falls evaluation completed Falls evaluation completed Falls evaluation completed     MEDICARE RISK AT HOME:  Medicare Risk at Home - 11/04/22 1428     Any stairs in or around the home? Yes    If so, are there any without handrails? No    Home free of loose throw rugs in walkways, pet beds, electrical cords, etc? Yes    Adequate lighting in your home to reduce risk of falls? Yes    Life alert? No    Use of a cane, walker or w/c? No    Grab bars in the bathroom? No    Shower chair or bench in shower? No    Elevated toilet seat or a handicapped toilet? No             TIMED UP AND GO:  Was the test performed?  No    Cognitive Function:        11/04/2022    2:29 PM 09/23/2021    2:22 PM 08/09/2019    9:28 AM  6CIT Screen  What Year? 0 points 0 points 0 points  What month? 0 points 0 points 0 points  What time? 0 points 0 points 0 points  Count back from 20 0 points 0 points 0 points  Months in reverse 0 points 0 points 0 points  Repeat phrase 0 points 0 points 0 points  Total Score 0 points 0 points 0 points    Immunizations Immunization History  Administered Date(s) Administered   Fluad Quad(high Dose 65+) 01/05/2019   Influenza-Unspecified 11/28/2015, 01/20/2017, 01/03/2018   PFIZER(Purple Top)SARS-COV-2 Vaccination 04/18/2019, 05/12/2019   Pneumococcal Conjugate-13 02/13/2018   Tdap 03/30/2007    TDAP status: Due, Education has been provided regarding the importance of this vaccine. Advised may receive this vaccine at local pharmacy or Health Dept. Aware to provide a copy of the vaccination record if obtained from local pharmacy or Health Dept. Verbalized acceptance and understanding.  Flu Vaccine status: Declined, Education has been provided regarding the  importance of this vaccine but patient still declined. Advised may receive this vaccine at local pharmacy or Health Dept. Aware to provide a copy of the vaccination record if obtained from local pharmacy or Health Dept. Verbalized acceptance and understanding.  Pneumococcal vaccine status: Due, Education has been provided regarding the importance of this vaccine. Advised may receive this vaccine at local pharmacy or Health Dept. Aware to provide a copy of the vaccination record if obtained from local pharmacy or Health Dept. Verbalized acceptance and understanding.  Covid-19 vaccine status: Information provided on how to obtain vaccines.   Qualifies for Shingles Vaccine? Yes   Zostavax completed No   Shingrix Completed?: No.    Education has been provided regarding the importance of this vaccine. Patient has been advised to call insurance company to determine out of pocket expense if they have not yet received this vaccine. Advised may also receive vaccine at local pharmacy or Health Dept. Verbalized acceptance and understanding.  Screening Tests Health Maintenance  Topic Date Due   Zoster Vaccines- Shingrix (1 of 2) Never done   MAMMOGRAM  10/21/2016   DTaP/Tdap/Td (2 - Td or Tdap) 03/29/2017   DEXA SCAN  Never done   OPHTHALMOLOGY EXAM  06/11/2021   FOOT EXAM  09/22/2021   COVID-19 Vaccine (3 - 2023-24 season) 11/27/2021   INFLUENZA VACCINE  10/28/2022   Pneumonia Vaccine 91+ Years old (2 of 2 - PPSV23 or PCV20) 09/23/2023 (Originally 02/14/2019)   HEMOGLOBIN A1C  03/25/2023   Diabetic kidney evaluation - eGFR measurement  09/23/2023   Diabetic kidney evaluation - Urine ACR  09/23/2023   Medicare Annual Wellness (AWV)  11/04/2023   Fecal DNA (Cologuard)  05/21/2024   Hepatitis C Screening  Completed   HPV VACCINES  Aged Out    Health Maintenance  Health Maintenance Due  Topic Date Due   Zoster Vaccines- Shingrix (1 of 2) Never done   MAMMOGRAM  10/21/2016   DTaP/Tdap/Td (2 - Td  or Tdap) 03/29/2017   DEXA SCAN  Never done   OPHTHALMOLOGY EXAM  06/11/2021   FOOT EXAM  09/22/2021   COVID-19 Vaccine (3 - 2023-24 season) 11/27/2021   INFLUENZA VACCINE  10/28/2022    Colorectal cancer screening: Type of screening: Cologuard. Completed [id number]. Repeat every 2 years  Mammogram status: Completed 2023. Repeat every year    Lung Cancer Screening: (Low Dose CT Chest recommended if Age 37-80 years, 20 pack-year currently smoking OR have quit w/in 15years.) does not qualify.   Lung Cancer Screening Referral: na  Additional Screening:  Hepatitis C Screening: does qualify; Completed yes  Vision Screening: Recommended annual ophthalmology exams for early detection of glaucoma and other disorders of the eye. Is the patient up to date with their annual eye exam?  Yes  Who is the provider or what is  the name of the office in which the patient attends annual eye exams? [facility] If pt is not established with a provider, would they like to be referred to a provider to establish care?  established .   Dental Screening: Recommended annual dental exams for proper oral hygiene  Diabetic Foot Exam: Diabetic Foot Exam: Overdue, Pt has been advised about the importance in completing this exam. Pt is scheduled for diabetic foot exam on  .  Nutrition Risk Assessment:  Has the patient had any N/V/D within the last 2 months?  No  Does the patient have any non-healing wounds?  No  Has the patient had any unintentional weight loss or weight gain?  No   Diabetes:  Is the patient diabetic?  Yes  If diabetic, was a CBG obtained today?  No  Did the patient bring in their glucometer from home?  No  How often do you monitor your CBG's? never.   Financial Strains and Diabetes Management:  Are you having any financial strains with the device, your supplies or your medication? No .  Does the patient want to be seen by Chronic Care Management for management of their diabetes?  No   Would the patient like to be referred to a Nutritionist or for Diabetic Management?  No   Diabetic Exams:  Diabetic Eye Exam: Overdue for diabetic eye exam. Pt has been advised about the importance in completing this exam. Patient advised to call and schedule an eye exam. Diabetic Foot Exam: Overdue, Pt has been advised about the importance in completing this exam. Pt is scheduled for diabetic foot exam on  .  Community Resource Referral / Chronic Care Management: CRR required this visit?  No   CCM required this visit?  No     Plan:     I have personally reviewed and noted the following in the patient's chart:   Medical and social history Use of alcohol, tobacco or illicit drugs  Current medications and supplements including opioid prescriptions. Patient is not currently taking opioid prescriptions. Functional ability and status Nutritional status Physical activity Advanced directives List of other physicians Hospitalizations, surgeries, and ER visits in previous 12 months Vitals Screenings to include cognitive, depression, and falls Referrals and appointments  In addition, I have reviewed and discussed with patient certain preventive protocols, quality metrics, and best practice recommendations. A written personalized care plan for preventive services as well as general preventive health recommendations were provided to patient.     Delana Meyer   11/04/2022   After Visit Summary: (MyChart) Due to this being a telephonic visit, the after visit summary with patients personalized plan was offered to patient via MyChart   Nurse Notes:    Ms. Zimny , Thank you for taking time to come for your Medicare Wellness Visit. I appreciate your ongoing commitment to your health goals. Please review the following plan we discussed and let me know if I can assist you in the future.   These are the goals we discussed:  Goals      be happy     Client understands the importance of  follow-up with providers by attending scheduled visits     Join a physical exercise class to be more active.     Patient Stated     Would like to get off some of these blood pressure medicines.     Patient Stated     My goal is to start Silver Sneakers Program so that I  can get more physically active.        This is a list of the screening recommended for you and due dates:  Health Maintenance  Topic Date Due   Zoster (Shingles) Vaccine (1 of 2) Never done   Mammogram  10/21/2016   DTaP/Tdap/Td vaccine (2 - Td or Tdap) 03/29/2017   DEXA scan (bone density measurement)  Never done   Eye exam for diabetics  06/11/2021   Complete foot exam   09/22/2021   COVID-19 Vaccine (3 - 2023-24 season) 11/27/2021   Flu Shot  10/28/2022   Pneumonia Vaccine (2 of 2 - PPSV23 or PCV20) 09/23/2023*   Hemoglobin A1C  03/25/2023   Yearly kidney function blood test for diabetes  09/23/2023   Yearly kidney health urinalysis for diabetes  09/23/2023   Medicare Annual Wellness Visit  11/04/2023   Cologuard (Stool DNA test)  05/21/2024   Hepatitis C Screening  Completed   HPV Vaccine  Aged Out  *Topic was postponed. The date shown is not the original due date.

## 2022-11-24 ENCOUNTER — Encounter: Payer: Self-pay | Admitting: Internal Medicine

## 2023-04-19 ENCOUNTER — Other Ambulatory Visit: Payer: Self-pay | Admitting: Internal Medicine

## 2023-06-27 ENCOUNTER — Encounter: Payer: Self-pay | Admitting: Internal Medicine

## 2023-09-02 ENCOUNTER — Other Ambulatory Visit: Payer: Self-pay | Admitting: Internal Medicine

## 2023-11-01 ENCOUNTER — Encounter: Payer: Self-pay | Admitting: Internal Medicine

## 2023-11-01 NOTE — Patient Instructions (Addendum)

## 2023-11-01 NOTE — Progress Notes (Unsigned)
 Subjective:    Patient ID: Cynthia Forbes, female    DOB: 02-Mar-1951, 73 y.o.   MRN: [id number]      HPI Cynthia Forbes is here for a Physical exam and her chronic medical problems.    BP 126-128/77-85.  Stopped hydralazine  - giving her stomach upset.  Her BP has been well-controlled without the medication.  Since she was here last she was helping to care for her family and has lost weight.  She is trying to maintain her weight and stay active.   Medications and allergies reviewed with patient and updated if appropriate.  Current Outpatient Medications on File Prior to Visit  Medication Sig Dispense Refill   albuterol  (PROAIR  HFA) 108 (90 Base) MCG/ACT inhaler Inhale 2 puffs into the lungs every 6 (six) hours as needed for wheezing or shortness of breath. Must keep appt for future refills 6.7 g 2   aspirin 81 MG chewable tablet Chew 81 mg by mouth daily.     vitamin B-12 (CYANOCOBALAMIN ) 1000 MCG tablet Take 1 tablet (1,000 mcg total) by mouth daily.     vitamin C  (ASCORBIC ACID) 250 MG tablet Take 1 tablet (250 mg total) by mouth daily.     Vitamin D , Ergocalciferol , (DRISDOL ) 1.25 MG (50000 UNIT) CAPS capsule Take 1 capsule (50,000 Units total) by mouth every 7 (seven) days. 8 capsule 0   No current facility-administered medications on file prior to visit.    Review of Systems  Constitutional:  Negative for fever.  Eyes:  Negative for visual disturbance.  Respiratory:  Negative for cough, shortness of breath and wheezing.   Cardiovascular:  Negative for chest pain, palpitations and leg swelling.  Gastrointestinal:  Negative for abdominal pain, blood in stool, constipation and diarrhea.       No gerd  Genitourinary:  Negative for dysuria.       Occasional urinary incontinence  Musculoskeletal:  Positive for back pain (occ with carrying heavy things). Negative for arthralgias.  Skin:  Negative for rash.  Neurological:  Positive for dizziness (if moves too fast). Negative for  light-headedness and headaches.  Psychiatric/Behavioral:  Negative for dysphoric mood. The patient is not nervous/anxious.        Objective:   Vitals:   11/02/23 0855  BP: 138/80  Pulse: 70  SpO2: 98%   Filed Weights   11/02/23 0855  Weight: 196 lb (88.9 kg)   Body mass index is 32.12 kg/m.  BP Readings from Last 3 Encounters:  11/02/23 138/80  11/02/23 138/80  09/23/22 (!) 154/102    Wt Readings from Last 3 Encounters:  11/02/23 196 lb (88.9 kg)  11/02/23 196 lb 3.2 oz (89 kg)  09/23/22 200 lb (90.7 kg)       Physical Exam Constitutional: She appears well-developed and well-nourished. No distress.  HENT:  Head: Normocephalic and atraumatic.  Right Ear: External ear normal. Normal ear canal and TM Left Ear: External ear normal.  Normal ear canal and TM Mouth/Throat: Oropharynx is clear and moist.  Eyes: Conjunctivae normal.  Neck: Neck supple. No tracheal deviation present. No thyromegaly present.  No carotid bruit  Cardiovascular: Normal rate, regular rhythm and normal heart sounds.   No murmur heard.  No edema. Pulmonary/Chest: Effort normal and breath sounds normal. No respiratory distress. She has no wheezes. She has no rales.  Breast: deferred   Abdominal: Soft. She exhibits no distension. There is no tenderness.  Lymphadenopathy: She has no cervical adenopathy.  Skin: Skin is warm and  dry. She is not diaphoretic.  Psychiatric: She has a normal mood and affect. Her behavior is normal.   Diabetic Foot Exam - Simple   Simple Foot Form Diabetic Foot exam was performed with the following findings: Yes 11/02/2023  9:36 AM  Visual Inspection No deformities, no ulcerations, no other skin breakdown bilaterally: Yes Sensation Testing Intact to touch and monofilament testing bilaterally: Yes Pulse Check Posterior Tibialis and Dorsalis pulse intact bilaterally: Yes Comments      Lab Results  Component Value Date   WBC 6.9 09/23/2022   HGB 12.9 09/23/2022    HCT 40.6 09/23/2022   PLT 331.0 09/23/2022   GLUCOSE 110 (H) 09/23/2022   CHOL 219 (H) 09/23/2022   TRIG 104.0 09/23/2022   HDL 44.30 09/23/2022   LDLDIRECT 176.7 01/11/2013   LDLCALC 154 (H) 09/23/2022   ALT 30 09/23/2022   AST 34 09/23/2022   NA 142 09/23/2022   K 3.9 09/23/2022   CL 106 09/23/2022   CREATININE 0.90 09/23/2022   BUN 9 09/23/2022   CO2 29 09/23/2022   TSH 1.430 09/04/2021   HGBA1C 7.0 (H) 09/23/2022         Assessment & Plan:   Physical exam: Screening blood work  ordered Exercise  active - doing some weight, walking Weight  obese - has lost weight Substance abuse  none   Reviewed recommended immunizations.   Health Maintenance  Topic Date Due   Diabetic kidney evaluation - Urine ACR  Never done   Zoster Vaccines- Shingrix (1 of 2) Never done   MAMMOGRAM  10/21/2016   DTaP/Tdap/Td (2 - Td or Tdap) 03/29/2017   DEXA SCAN  Never done   HEMOGLOBIN A1C  03/25/2023   OPHTHALMOLOGY EXAM  08/17/2023   Diabetic kidney evaluation - eGFR measurement  09/23/2023   INFLUENZA VACCINE  10/28/2023   COVID-19 Vaccine (3 - Pfizer risk series) 11/17/2023 (Originally 06/09/2019)   Fecal DNA (Cologuard)  05/21/2024   FOOT EXAM  11/01/2024   Medicare Annual Wellness (AWV)  11/01/2024   Pneumococcal Vaccine: 50+ Years  Completed   Hepatitis C Screening  Completed   Hepatitis B Vaccines  Aged Out   HPV VACCINES  Aged Out   Meningococcal B Vaccine  Aged Out     Dexa, mammogram ordered at wellness visit     See Problem List for Assessment and Plan of chronic medical problems.

## 2023-11-02 ENCOUNTER — Ambulatory Visit (INDEPENDENT_AMBULATORY_CARE_PROVIDER_SITE_OTHER): Payer: Medicare (Managed Care) | Admitting: Internal Medicine

## 2023-11-02 ENCOUNTER — Ambulatory Visit (INDEPENDENT_AMBULATORY_CARE_PROVIDER_SITE_OTHER): Payer: Medicare (Managed Care)

## 2023-11-02 VITALS — BP 138/80 | HR 70 | Ht 65.5 in | Wt 196.2 lb

## 2023-11-02 VITALS — BP 138/80 | HR 70 | Ht 65.5 in | Wt 196.0 lb

## 2023-11-02 DIAGNOSIS — Z1231 Encounter for screening mammogram for malignant neoplasm of breast: Secondary | ICD-10-CM

## 2023-11-02 DIAGNOSIS — E559 Vitamin D deficiency, unspecified: Secondary | ICD-10-CM | POA: Diagnosis not present

## 2023-11-02 DIAGNOSIS — I10 Essential (primary) hypertension: Secondary | ICD-10-CM

## 2023-11-02 DIAGNOSIS — E1165 Type 2 diabetes mellitus with hyperglycemia: Secondary | ICD-10-CM

## 2023-11-02 DIAGNOSIS — Z23 Encounter for immunization: Secondary | ICD-10-CM | POA: Diagnosis not present

## 2023-11-02 DIAGNOSIS — E782 Mixed hyperlipidemia: Secondary | ICD-10-CM

## 2023-11-02 DIAGNOSIS — E538 Deficiency of other specified B group vitamins: Secondary | ICD-10-CM

## 2023-11-02 DIAGNOSIS — Z0000 Encounter for general adult medical examination without abnormal findings: Secondary | ICD-10-CM | POA: Diagnosis not present

## 2023-11-02 DIAGNOSIS — Z780 Asymptomatic menopausal state: Secondary | ICD-10-CM

## 2023-11-02 MED ORDER — ROSUVASTATIN CALCIUM 20 MG PO TABS
20.0000 mg | ORAL_TABLET | Freq: Every day | ORAL | 3 refills | Status: AC
Start: 2023-11-02 — End: ?

## 2023-11-02 MED ORDER — CARVEDILOL 25 MG PO TABS: 25.0000 mg | ORAL_TABLET | Freq: Two times a day (BID) | ORAL | 1 refills | Status: DC

## 2023-11-02 MED ORDER — AMLODIPINE BESYLATE 10 MG PO TABS: 5.0000 mg | ORAL_TABLET | Freq: Two times a day (BID) | ORAL | 1 refills | Status: AC

## 2023-11-02 NOTE — Assessment & Plan Note (Signed)
 Chronic Not taking medication daily Regular exercise and healthy diet encouraged Check lipid panel, CMP, TSH Restart crestor  20 mg daily

## 2023-11-02 NOTE — Assessment & Plan Note (Signed)
 Chronic Lab Results  Component Value Date   HGBA1C 7.0 (H) 09/23/2022   Not ideally controlled-not on medication A1c, urine albumin/creatinine ratio today Stressed importance of sugar control and routine follow up Stressed diabetic diet, regular exercise and weight loss

## 2023-11-02 NOTE — Assessment & Plan Note (Signed)
Chronic Taking vitamin B12 Check B12 level

## 2023-11-02 NOTE — Patient Instructions (Addendum)
 Cynthia Forbes , Thank you for taking time out of your busy schedule to complete your Annual Wellness Visit with me. I enjoyed our conversation and look forward to speaking with you again next year. I, as well as your care team,  appreciate your ongoing commitment to your health goals. Please review the following plan we discussed and let me know if I can assist you in the future. Your Game plan/ To Do List    Referrals: If you haven't heard from the office you've been referred to, please reach out to them at the phone provided. Pneumovax vaccine given, Mammogram ordered, DEXA ordered, Labs Ordered: Hepatitis C Screening   Follow up Visits: We will see or speak with you next year for your Next Medicare AWV with our clinical staff Have you seen your provider in the last 6 months (3 months if uncontrolled diabetes)? Yes  Clinician Recommendations:  Aim for 30 minutes of exercise or brisk walking, 6-8 glasses of water, and 5 servings of fruits and vegetables each day. Educated and advised on getting the Tdap vaccine in 2025 at local pharmacy.      This is a list of the screenings recommended for you:  Health Maintenance  Topic Date Due   Yearly kidney health urinalysis for diabetes  Never done   Zoster (Shingles) Vaccine (1 of 2) Never done   Mammogram  10/21/2016   DTaP/Tdap/Td vaccine (2 - Td or Tdap) 03/29/2017   DEXA scan (bone density measurement)  Never done   Complete foot exam   09/22/2021   Hemoglobin A1C  03/25/2023   Eye exam for diabetics  08/17/2023   Yearly kidney function blood test for diabetes  09/23/2023   Flu Shot  10/28/2023   COVID-19 Vaccine (3 - Pfizer risk series) 11/17/2023*   Cologuard (Stool DNA test)  05/21/2024   Medicare Annual Wellness Visit  11/01/2024   Pneumococcal Vaccine for age over 73  Completed   Hepatitis C Screening  Completed   Hepatitis B Vaccine  Aged Out   HPV Vaccine  Aged Out   Meningitis B Vaccine  Aged Out  *Topic was postponed. The date  shown is not the original due date.    Advanced directives: (Provided) Advance directive discussed with you today. I have provided a copy for you to complete at home and have notarized. Once this is complete, please bring a copy in to our office so we can scan it into your chart.  Advance Care Planning is important because it:  [x]  Makes sure you receive the medical care that is consistent with your values, goals, and preferences  [x]  It provides guidance to your family and loved ones and reduces their decisional burden about whether or not they are making the right decisions based on your wishes.  Follow the link provided in your after visit summary or read over the paperwork we have mailed to you to help you started getting your Advance Directives in place. If you need assistance in completing these, please reach out to us  so that we can help you!

## 2023-11-02 NOTE — Progress Notes (Signed)
 Subjective:   Cynthia Forbes is a 73 y.o. who presents for a Medicare Wellness preventive visit.  As a reminder, Annual Wellness Visits don't include a physical exam, and some assessments may be limited, especially if this visit is performed virtually. We may recommend an in-person follow-up visit with your provider if needed.  Visit Complete: In person  Persons Participating in Visit: Patient.  AWV Questionnaire: Yes: Patient Medicare AWV questionnaire was completed by the patient on 10/29/2023; I have confirmed that all information answered by patient is correct and no changes since this date.  Cardiac Risk Factors include: advanced age (>69men, >46 women);diabetes mellitus;dyslipidemia;hypertension     Objective:    Today's Vitals   11/02/23 0815  BP: 138/80  Pulse: 70  SpO2: 98%  Weight: 196 lb 3.2 oz (89 kg)  Height: 5' 5.5 (1.664 m)   Body mass index is 32.15 kg/m.     11/02/2023    8:14 AM 11/04/2022    2:27 PM 09/23/2021    2:17 PM 01/07/2021    1:39 AM 09/22/2020    1:29 PM 08/09/2019    8:45 AM 01/22/2014    9:09 PM  Advanced Directives  Does Patient Have a Medical Advance Directive? No Yes No No No No No   Type of Furniture conservator/restorer;Living will       Copy of Healthcare Power of Attorney in Chart?  No - copy requested       Would patient like information on creating a medical advance directive? Yes (MAU/Ambulatory/Procedural Areas - Information given)  No - Patient declined No - Patient declined No - Patient declined Yes (ED - Information included in AVS) No - patient declined information      Data saved with a previous flowsheet row definition    Current Medications (verified) Outpatient Encounter Medications as of 11/02/2023  Medication Sig   albuterol  (PROAIR  HFA) 108 (90 Base) MCG/ACT inhaler Inhale 2 puffs into the lungs every 6 (six) hours as needed for wheezing or shortness of breath. Must keep appt for future refills    amLODipine  (NORVASC ) 10 MG tablet Take 1/2 (one-half) tablet by mouth twice daily   aspirin 81 MG chewable tablet Chew 81 mg by mouth daily.   carvedilol  (COREG ) 25 MG tablet Take 1 tablet (25 mg total) by mouth 2 (two) times daily with a meal.   Continuous Glucose Receiver (DEXCOM G7 RECEIVER) DEVI UAD to check sugars  E11.9   Continuous Glucose Sensor (DEXCOM G7 SENSOR) MISC UAD to check sugars  E11.9   hydrALAZINE  (APRESOLINE ) 50 MG tablet Take 1 tablet (50 mg total) by mouth 2 (two) times daily.   rosuvastatin  (CRESTOR ) 20 MG tablet Take 1 tablet (20 mg total) by mouth daily.   vitamin B-12 (CYANOCOBALAMIN ) 1000 MCG tablet Take 1 tablet (1,000 mcg total) by mouth daily.   vitamin C  (ASCORBIC ACID) 250 MG tablet Take 1 tablet (250 mg total) by mouth daily.   Vitamin D , Ergocalciferol , (DRISDOL ) 1.25 MG (50000 UNIT) CAPS capsule Take 1 capsule (50,000 Units total) by mouth every 7 (seven) days.   No facility-administered encounter medications on file as of 11/02/2023.    Allergies (verified) Codeine, Ivp dye [iodinated contrast media], Benazepril , Benicar  [olmesartan ], Clonidine  derivatives, and Strawberry extract   History: Past Medical History:  Diagnosis Date   Bronchitis    Diabetes mellitus without complication (HCC)    Hyperlipemia    Hypertension    Kidney stones 2005 & 08/2011  passed spontaneously X 2   Past Surgical History:  Procedure Laterality Date   ABDOMINAL HYSTERECTOMY  10/05/11   Dr Rosalynn   OOPHORECTOMY  10/05/11   degenerative fibroid; Dr Rosalynn   TONSILLECTOMY     vaginal bleed  10/19/11   hematoma; Dr Johnnye   Family History  Problem Relation Age of Onset   Alzheimer's disease Mother    Cancer Mother    Kidney disease Father    Diabetes Father    Prostate cancer Father    Cancer Brother        spine   Liver cancer Maternal Grandfather    Social History   Socioeconomic History   Marital status: Divorced    Spouse name: Not on file   Number of  children: Not on file   Years of education: Not on file   Highest education level: Not on file  Occupational History   Not on file  Tobacco Use   Smoking status: Never   Smokeless tobacco: Never  Vaping Use   Vaping status: Never Used  Substance and Sexual Activity   Alcohol use: No   Drug use: No   Sexual activity: Not Currently  Other Topics Concern   Not on file  Social History Narrative   Not on file   Social Drivers of Health   Financial Resource Strain: Low Risk  (11/02/2023)   Overall Financial Resource Strain (CARDIA)    Difficulty of Paying Living Expenses: Not hard at all  Food Insecurity: No Food Insecurity (11/02/2023)   Hunger Vital Sign    Worried About Running Out of Food in the Last Year: Never true    Ran Out of Food in the Last Year: Never true  Transportation Needs: No Transportation Needs (11/02/2023)   PRAPARE - Administrator, Civil Service (Medical): No    Lack of Transportation (Non-Medical): No  Physical Activity: Insufficiently Active (11/02/2023)   Exercise Vital Sign    Days of Exercise per Week: 2 days    Minutes of Exercise per Session: 30 min  Stress: No Stress Concern Present (11/02/2023)   [employer] of Occupational Health - Occupational Stress Questionnaire    Feeling of Stress: Not at all  Social Connections: Moderately Isolated (11/02/2023)   Social Connection and Isolation Panel    Frequency of Communication with Friends and Family: Patient declined    Frequency of Social Gatherings with Friends and Family: More than three times a week    Attends Religious Services: Never    Database administrator or Organizations: Yes    Attends Banker Meetings: Never    Marital Status: Divorced    Tobacco Counseling Counseling given: No    Clinical Intake:  Pre-visit preparation completed: Yes  Pain : No/denies pain     BMI - recorded: 32.15 Nutritional Status: BMI > 30  Obese Nutritional Risks: None Diabetes:  Yes CBG done?: No Did pt. bring in CBG monitor from home?: No  Lab Results  Component Value Date   HGBA1C 7.0 (H) 09/23/2022   HGBA1C 7.2 (H) 09/04/2021   HGBA1C 7.9 (H) 09/22/2020     How often do you need to have someone help you when you read instructions, pamphlets, or other written materials from your doctor or pharmacy?: 1 - Never  Interpreter Needed?: No  Information entered by :: Verdie Saba, CMA   Activities of Daily Living     11/02/2023    8:20 AM 10/29/2023  9:30 AM  In your present state of health, do you have any difficulty performing the following activities:  Hearing? 0 0  Vision? 0 0  Difficulty concentrating or making decisions? 0 0  Walking or climbing stairs? 0 0  Dressing or bathing? 0 0  Doing errands, shopping? 0 0  Preparing Food and eating ? N N  Using the Toilet? N N  In the past six months, have you accidently leaked urine? Cynthia Forbes Cynthia Forbes  Comment wears a pad   Do you have problems with loss of bowel control? N N  Managing your Medications? N N  Managing your Finances? N N  Housekeeping or managing your Housekeeping? N N    Patient Care Team: Geofm Glade PARAS, MD as PCP - General (Internal Medicine) Raford Riggs, MD as PCP - Cardiology (Cardiology) [facility] as Consulting Physician (Optometry) [place], [facility]  I have updated your Care Teams any recent Medical Services you may have received from other providers in the past year.     Assessment:   This is a routine wellness examination for Cynthia Forbes.  Hearing/Vision screen Hearing Screening - Comments:: Denies hearing difficulties   Vision Screening - Comments:: Wears rx glasses - up to date with routine eye exams with [facility] - Dr Joshua   Goals Addressed               This Visit's Progress     Patient Stated (pt-stated)        Patient stated she plans to continue to lose weight and join a gym       Depression Screen     11/02/2023    8:22 AM  11/04/2022    2:25 PM 09/23/2022    7:56 AM 09/23/2022    7:55 AM 09/23/2021    2:14 PM 09/22/2020    3:14 PM 09/22/2020    2:55 PM  PHQ 2/9 Scores  PHQ - 2 Score 0 0 0 0 0 3 0  PHQ- 9 Score 0 0         Fall Risk     11/02/2023    8:21 AM 10/29/2023    9:30 AM 11/04/2022    2:27 PM 09/23/2022    7:55 AM 09/23/2021    2:20 PM  Fall Risk   Falls in the past year? 0 1 1 0 0  Comment no falls - confirmed w/pt      Number falls in past yr: 0  0 0 0  Injury with Fall? 0 0 1 0 0  Risk for fall due to : No Fall Risks  History of fall(s) No Fall Risks No Fall Risks  Follow up Falls evaluation completed;Falls prevention discussed  Falls evaluation completed Falls evaluation completed Falls evaluation completed      Data saved with a previous flowsheet row definition    MEDICARE RISK AT HOME:  Medicare Risk at Home Any stairs in or around the home?: Yes If so, are there any without handrails?: No Home free of loose throw rugs in walkways, pet beds, electrical cords, etc?: Yes Adequate lighting in your home to reduce risk of falls?: Yes Life alert?: No Use of a cane, walker or w/c?: No Grab bars in the bathroom?: No Shower chair or bench in shower?: No Elevated toilet seat or a handicapped toilet?: No  TIMED UP AND GO:  Was the test performed?  No  Cognitive Function: 6CIT completed  11/02/2023    8:24 AM 11/04/2022    2:29 PM 09/23/2021    2:22 PM 08/09/2019    9:28 AM  6CIT Screen  What Year? 0 points 0 points 0 points 0 points  What month? 0 points 0 points 0 points 0 points  What time? 0 points 0 points 0 points 0 points  Count back from 20 0 points 0 points 0 points 0 points  Months in reverse 0 points 0 points 0 points 0 points  Repeat phrase 0 points 0 points 0 points 0 points  Total Score 0 points 0 points 0 points 0 points    Immunizations Immunization History  Administered Date(s) Administered   Fluad Quad(high Dose 65+) 01/05/2019   Influenza-Unspecified  11/28/2015, 01/20/2017, 01/03/2018   PFIZER(Purple Top)SARS-COV-2 Vaccination 04/18/2019, 05/12/2019   PNEUMOCOCCAL CONJUGATE-20 11/02/2023   Pneumococcal Conjugate-13 02/13/2018   Tdap 03/30/2007    Screening Tests Health Maintenance  Topic Date Due   Diabetic kidney evaluation - Urine ACR  Never done   Zoster Vaccines- Shingrix (1 of 2) Never done   MAMMOGRAM  10/21/2016   DTaP/Tdap/Td (2 - Td or Tdap) 03/29/2017   DEXA SCAN  Never done   FOOT EXAM  09/22/2021   HEMOGLOBIN A1C  03/25/2023   OPHTHALMOLOGY EXAM  08/17/2023   Diabetic kidney evaluation - eGFR measurement  09/23/2023   INFLUENZA VACCINE  10/28/2023   COVID-19 Vaccine (3 - Pfizer risk series) 11/17/2023 (Originally 06/09/2019)   Fecal DNA (Cologuard)  05/21/2024   Medicare Annual Wellness (AWV)  11/01/2024   Pneumococcal Vaccine: 50+ Years  Completed   Hepatitis C Screening  Completed   Hepatitis B Vaccines  Aged Out   HPV VACCINES  Aged Out   Meningococcal B Vaccine  Aged Out    Health Maintenance  Health Maintenance Due  Topic Date Due   Diabetic kidney evaluation - Urine ACR  Never done   Zoster Vaccines- Shingrix (1 of 2) Never done   MAMMOGRAM  10/21/2016   DTaP/Tdap/Td (2 - Td or Tdap) 03/29/2017   DEXA SCAN  Never done   FOOT EXAM  09/22/2021   HEMOGLOBIN A1C  03/25/2023   OPHTHALMOLOGY EXAM  08/17/2023   Diabetic kidney evaluation - eGFR measurement  09/23/2023   INFLUENZA VACCINE  10/28/2023   Health Maintenance Items Addressed:  Pneumovax vaccine given, Mammogram ordered, DEXA ordered, Labs Ordered: Hepatitis C Screening   Additional Screening:  Vision Screening: Recommended annual ophthalmology exams for early detection of glaucoma and other disorders of the eye. Would you like a referral to an eye doctor? No Patient plans to schedule an eye exam w/Dr Joshua of [facility] in 2025.   Dental Screening: Recommended annual dental exams for proper oral hygiene  Community Resource Referral /  Chronic Care Management: CRR required this visit?  No   CCM required this visit?  No   Plan:    I have personally reviewed and noted the following in the patient's chart:   Medical and social history Use of alcohol, tobacco or illicit drugs  Current medications and supplements including opioid prescriptions. Patient is not currently taking opioid prescriptions. Functional ability and status Nutritional status Physical activity Advanced directives List of other physicians Hospitalizations, surgeries, and ER visits in previous 12 months Vitals Screenings to include cognitive, depression, and falls Referrals and appointments  In addition, I have reviewed and discussed with patient certain preventive protocols, quality metrics, and best practice recommendations. A written personalized care plan for preventive  services as well as general preventive health recommendations were provided to patient.   Verdie CHRISTELLA Saba, CMA   11/02/2023   After Visit Summary: (In Person-Declined) Patient declined AVS at this time.  Notes: Nothing significant to report at this time.

## 2023-11-02 NOTE — Assessment & Plan Note (Signed)
Chronic Check vitamin D level 

## 2023-11-02 NOTE — Assessment & Plan Note (Addendum)
 Chronic Has some white coat htn - BP well controlled at home Stopped taking hydralazine  - upset her stomach BP borderline controlled CMP, cbc continue amlodipine  5 mg twice daily, carvedilol  25 mg twice daily

## 2023-12-14 ENCOUNTER — Ambulatory Visit: Payer: Medicare (Managed Care)

## 2024-02-13 DIAGNOSIS — Z6829 Body mass index (BMI) 29.0-29.9, adult: Secondary | ICD-10-CM | POA: Diagnosis not present

## 2024-02-13 DIAGNOSIS — Z01419 Encounter for gynecological examination (general) (routine) without abnormal findings: Secondary | ICD-10-CM | POA: Diagnosis not present

## 2024-02-22 ENCOUNTER — Encounter: Payer: Self-pay | Admitting: Pharmacist

## 2024-02-22 NOTE — Progress Notes (Signed)
 Pharmacy Quality Measure Review  This patient is appearing on a report for being at risk of failing the adherence measure for cholesterol (statin) medications this calendar year.   Medication: Rosuvastatin  Last fill date: 01/29/24 for 90 day supply  Insurance report was not up to date. No action needed at this time.   Darrelyn Drum, PharmD, BCPS, CPP Clinical Pharmacist Practitioner [facility] [phone]

## 2024-04-24 ENCOUNTER — Other Ambulatory Visit: Payer: Self-pay | Admitting: Internal Medicine

## 2024-04-24 DIAGNOSIS — I10 Essential (primary) hypertension: Secondary | ICD-10-CM

## 2024-05-04 ENCOUNTER — Ambulatory Visit: Payer: Medicare (Managed Care) | Admitting: Internal Medicine

## 2024-05-10 ENCOUNTER — Ambulatory Visit: Payer: Medicare (Managed Care) | Admitting: Internal Medicine

## 2024-11-08 ENCOUNTER — Ambulatory Visit: Payer: Medicare (Managed Care)
